# Patient Record
Sex: Female | Born: 1935
Health system: Southern US, Community
[De-identification: ages and names within clinical notes are randomized; demographics above are authoritative.]

## PROBLEM LIST (undated history)

## (undated) DIAGNOSIS — F0390 Unspecified dementia without behavioral disturbance: Secondary | ICD-10-CM

## (undated) DIAGNOSIS — E119 Type 2 diabetes mellitus without complications: Secondary | ICD-10-CM

## (undated) DIAGNOSIS — I10 Essential (primary) hypertension: Secondary | ICD-10-CM

## (undated) HISTORY — DX: Type 2 diabetes mellitus without complications: E11.9

---

## 1998-03-13 ENCOUNTER — Other Ambulatory Visit: Admission: RE | Admit: 1998-03-13 | Discharge: 1998-03-13 | Payer: Self-pay | Admitting: Geriatric Medicine

## 1999-05-21 ENCOUNTER — Encounter: Admission: RE | Admit: 1999-05-21 | Discharge: 1999-05-21 | Payer: Self-pay | Admitting: Geriatric Medicine

## 1999-05-21 ENCOUNTER — Encounter: Payer: Self-pay | Admitting: Geriatric Medicine

## 1999-10-24 ENCOUNTER — Encounter: Payer: Self-pay | Admitting: Geriatric Medicine

## 1999-10-24 ENCOUNTER — Encounter: Admission: RE | Admit: 1999-10-24 | Discharge: 1999-10-24 | Payer: Self-pay | Admitting: Geriatric Medicine

## 1999-11-20 ENCOUNTER — Other Ambulatory Visit: Admission: RE | Admit: 1999-11-20 | Discharge: 1999-11-20 | Payer: Self-pay | Admitting: Orthopedic Surgery

## 2000-07-23 ENCOUNTER — Encounter: Admission: RE | Admit: 2000-07-23 | Discharge: 2000-07-23 | Payer: Self-pay | Admitting: Geriatric Medicine

## 2000-07-23 ENCOUNTER — Encounter: Payer: Self-pay | Admitting: Geriatric Medicine

## 2000-12-29 ENCOUNTER — Encounter (INDEPENDENT_AMBULATORY_CARE_PROVIDER_SITE_OTHER): Payer: Self-pay | Admitting: *Deleted

## 2000-12-29 ENCOUNTER — Ambulatory Visit (HOSPITAL_COMMUNITY): Admission: RE | Admit: 2000-12-29 | Discharge: 2000-12-29 | Payer: Self-pay | Admitting: *Deleted

## 2001-07-25 ENCOUNTER — Encounter: Payer: Self-pay | Admitting: Geriatric Medicine

## 2001-07-25 ENCOUNTER — Encounter: Admission: RE | Admit: 2001-07-25 | Discharge: 2001-07-25 | Payer: Self-pay | Admitting: Geriatric Medicine

## 2002-07-19 ENCOUNTER — Ambulatory Visit (HOSPITAL_COMMUNITY): Admission: RE | Admit: 2002-07-19 | Discharge: 2002-07-19 | Payer: Self-pay | Admitting: Geriatric Medicine

## 2002-07-28 ENCOUNTER — Encounter: Payer: Self-pay | Admitting: Geriatric Medicine

## 2002-07-28 ENCOUNTER — Encounter: Admission: RE | Admit: 2002-07-28 | Discharge: 2002-07-28 | Payer: Self-pay | Admitting: Geriatric Medicine

## 2002-09-18 ENCOUNTER — Encounter: Payer: Self-pay | Admitting: Orthopedic Surgery

## 2002-09-22 ENCOUNTER — Encounter: Payer: Self-pay | Admitting: Orthopedic Surgery

## 2002-09-22 ENCOUNTER — Inpatient Hospital Stay (HOSPITAL_COMMUNITY): Admission: RE | Admit: 2002-09-22 | Discharge: 2002-09-27 | Payer: Self-pay | Admitting: Orthopedic Surgery

## 2003-08-14 ENCOUNTER — Encounter: Admission: RE | Admit: 2003-08-14 | Discharge: 2003-08-14 | Payer: Self-pay | Admitting: Geriatric Medicine

## 2004-05-14 ENCOUNTER — Ambulatory Visit (HOSPITAL_COMMUNITY): Admission: RE | Admit: 2004-05-14 | Discharge: 2004-05-14 | Payer: Self-pay | Admitting: *Deleted

## 2004-05-14 ENCOUNTER — Encounter (INDEPENDENT_AMBULATORY_CARE_PROVIDER_SITE_OTHER): Payer: Self-pay | Admitting: *Deleted

## 2004-08-14 ENCOUNTER — Encounter: Admission: RE | Admit: 2004-08-14 | Discharge: 2004-08-14 | Payer: Self-pay | Admitting: Geriatric Medicine

## 2005-02-23 ENCOUNTER — Encounter: Admission: RE | Admit: 2005-02-23 | Discharge: 2005-02-23 | Payer: Self-pay | Admitting: Geriatric Medicine

## 2005-08-17 ENCOUNTER — Encounter: Admission: RE | Admit: 2005-08-17 | Discharge: 2005-08-17 | Payer: Self-pay | Admitting: Geriatric Medicine

## 2006-09-24 ENCOUNTER — Encounter: Admission: RE | Admit: 2006-09-24 | Discharge: 2006-09-24 | Payer: Self-pay | Admitting: Geriatric Medicine

## 2007-06-13 ENCOUNTER — Ambulatory Visit: Payer: Self-pay | Admitting: Vascular Surgery

## 2007-11-02 ENCOUNTER — Encounter: Admission: RE | Admit: 2007-11-02 | Discharge: 2007-11-02 | Payer: Self-pay | Admitting: Geriatric Medicine

## 2008-11-02 ENCOUNTER — Encounter: Admission: RE | Admit: 2008-11-02 | Discharge: 2008-11-02 | Payer: Self-pay | Admitting: Geriatric Medicine

## 2010-09-19 NOTE — Op Note (Signed)
NAME:  Natalie Peterson, Natalie Peterson                     ACCOUNT NO.:  [id number]   MEDICAL RECORD NO.:  [id number]                   PATIENT TYPE:  INP   LOCATION:  NA                                   FACILITY:  [facility]   PHYSICIAN:  Almedia Balls. Ranell Patrick, M.D.              DATE OF BIRTH:  01-Aug-1926   DATE OF PROCEDURE:  09/22/2002  DATE OF DISCHARGE:                                 OPERATIVE REPORT   PREOPERATIVE DIAGNOSIS:  Right knee osteoarthritis.   POSTOPERATIVE DIAGNOSIS:  Right knee osteoarthritis.   PROCEDURE:  Right total knee replacement utilizing Depuy Sigma RP  prosthesis.   SURGEON:  Almedia Balls. Ranell Patrick, M.D.   FIRST ASSISTANT:  Marlowe Kays, M.D.   ANESTHESIA:  General.   ESTIMATED BLOOD LOSS:  Minimal.   TOURNIQUET TIME:  1 hour and 40 minutes.   INSTRUMENT COUNT:  Correct.   COMPLICATIONS:  None.   FLUIDS REPLACED:  1200 mL crystalloid.   INDICATIONS FOR PROCEDURE:  The patient is a 77 year old female who presents  with a long history of increasing right knee pain. The patient has advanced  osteoarthritis by x-ray, has failed conservative management consisting of  injections, 1antiinflammatories, and pain medications. She now presents for  a knee replacement surgery in order to relieve her pain and improve her  mobility. The risk of DVTs and infection were discussed. The patient  presents now for surgical treatment.   DESCRIPTION OF PROCEDURE:  After an adequate level of anesthesia was  achieved, the patient was positioned supine on the operating table, a  nonsterile tourniquet was placed on the right proximal thigh, the right  proximal leg was prepped and draped in its entirety in a sterile fashion.  Starting range of motion revealed a 7 degree flexion contracture with  flexion past 100 degrees with stable varus and valgus stress. After sterile  prep and drape, the right leg was cannulated, tourniquet elevated to 350  mmHg and a longitudinal skin incision was  created in the midline of the  knee. This was taken sharply down to the medial parapatellar retinaculum and  medial parapatellar arthrotomy was then created. The patella was everted  allowing the knee to flex and the distal femur exposed. At this point, a  step cut drill was used to open the distal end of the femur, facilitating  placement of an intermedullary guide for distal femoral resection. 10 mm of  the distal femur were removed utilizing the oscillating saw facilitating an  AP sizing guide and this was placed with the feet on the posterior aspect of  the femoral condyles and anterior down approach. She was basically a size 3  by sizing guide slightly smaller than that and she was set for a size 3 and  pinned for the right knee in 3 degrees of external rotation. The AP  resection block was then inserted and used to remove bone from the posterior  aspects of  the femur. Soft tissue was removed from the tibia facilitating  subluxation of the tibia forward. This allowed [place] to place the external  alignment guide for the perpendicular 90 degree tibial cut. 4 mm was taken  off the defective side. Once the resection from the proximal tibia was done,  flexion extension gaps were checked, a 10 mm block was placed with the knee  in extension. This was noted to be quite tight and at this point 2 mm of  additional resection was performed from the tibia and the 10 mm block was  then able to be inserted without difficulty. At this point, posterior  osteophytes were taken off the back of the femur utilizing a curved  osteotome and laminar spreader. The posterior capsule was released utilizing  Cobb elevators sharply to the back of the femur. The remaining meniscus was  removed, the chamfer block and posterior cruciate substituting box cut block  was placed onto the end of the distal femur and pinned in place thus the box  cut was made and the chamfer cuts were also made. At this point, trial   components were introduced, these were all size three components and  __________ keel on the tibial side as well. The knee was taken through a  full range of motion and outstanding soft tissue balance was noted both in  extension and in flexion, full range of motion in full extension was  achieved. At this point, the patella was then assessed for its thickness,  this was 23 mm starting thickness. This was taken down to 14 mm thickness  with an 8 poly recreating the 23 mm patella thickness. Once the patella  sizing was done which was a size 32 mm, the drill holes were made in the  patella for cementing with the button in place. Trials removed, the knee was  thoroughly irrigated with pulsatile irrigation. The remaining soft tissue  was removed to facilitate good exposure, knee was subluxed forward and  utilizing Depuy 1 cement, the real component size 3 Depuy Sigma rotating  platform components were cemented into place. The knee was placed in full  extension with a trial 10 insert and axial load applied to the end of the  femur during the cement hardening phase. Also the patella was cemented into  place and a clamp used to hold that until the cement was hard on the back  table. The knee was inspected for loose cement, osteotome was used to remove  excess cement from around the edge of the femur. Taken through a full range  of motion and again excellent extension, flexion and stability was noted,  full extension was achieved. The patella tracked normally without any  patellar tilt utilizing a no-touch __________  technique, the trial insert  was removed and the 10 mm thick posterior stabilized poly was inserted in  place after thorough inspection for loose debris. At this point, the knee  was re-reduced, drain was placed in the lateral gutter. The medial  parapatellar retinaculum was closed utilizing #1 Vicryl in a figure-of-eight interrupted fashion followed by a 2-0 subcu with staples for the  skin.  Sterile dressings applied followed by a knee immobilizer. The patient was  taken to the recovery room in stable condition having tolerated the surgery  well.  Almedia Balls. Ranell Patrick, M.D.    SRN/MEDQ  D:  09/22/2002  T:  09/22/2002  Job:  [id number]

## 2010-09-19 NOTE — Discharge Summary (Signed)
NAME:  Natalie Peterson, Natalie Peterson                     ACCOUNT NO.:  [id number]   MEDICAL RECORD NO.:  [id number]                   PATIENT TYPE:  INP   LOCATION:  0460                                 FACILITY:  [facility]   PHYSICIAN:  Almedia Balls. Ranell Patrick, M.D.              DATE OF BIRTH:  June 15, 1926   DATE OF ADMISSION:  09/22/2002  DATE OF DISCHARGE:  09/27/2002                                 DISCHARGE SUMMARY   ADMITTING DIAGNOSES:  1. Osteoarthritis right knee.  2. Hypertension.  3. Hypercholesterolemia.  4. Osteopenia.   DISCHARGE DIAGNOSES:  1. Osteoarthritis right knee.  2. Hypertension.  3. Hypercholesterolemia.  4. Osteopenia.  5. Mild postoperative anemia.  6. Dysphagia regarding p.o. pain medications.   OPERATION:  On Sep 22, 2002 the patient underwent right total knee  replacement arthroplasty utilizing DePuy Sigma RP prosthesis - all three  components cemented; Dr. Fayrene Fearing [place] assisted.   CONSULTS:  None.   BRIEF HISTORY:  This 77 year old lady with progressive and interfering right  knee pain had been seen in the office for continuing progressive problems  concerning inability to get about as well as increased pain.  Conservative  treatment including corticosteroid injections as well as antiinflammatories  have only offered her short-term relief.  She does continue with her overall  pain and discomfort.  After much consideration and x-rays showing  deterioration of the joint space it was felt she would benefit from surgical  intervention and is admitted for the above procedure.   COURSE IN THE HOSPITAL:  The patient tolerated the surgical procedure quite  well.  She was placed on the total knee protocol and progressed nicely with  that.  It was thought she might need an inpatient rehab program.  A rehab  consult was asked for and received.  However, the patient was progressing so  nicely with therapy it was felt that she was doing too well to be a  candidate for an  inpatient rehab program.  The patient only suffered a mild  anemia postoperatively.  She was placed on Coumadin protocol postoperatively  for prevention of DVT and encouraged in ambulation.  She had a little  difficulty with her diet early on as well as a little difficulty in  swallowing pills.  The oropharynx was examined and found to have no  excoriation or plaques.  Cepacol Lozenges were offered.  She was somewhat  improved at discharge.   The left lower extremity had flexed on CPM machine to about 65 degrees.  She  was weightbearing as tolerated, ambulating in the hall quite well with the  assistance of physical therapy.  The wound was dry, neurovascular intact to  the left lower extremity.  It was felt that she could be transferred to the  [facility] to continue with rehabilitation as listed  below.   LABORATORY VALUES IN THE HOSPITAL:  Hematologically, CBC with differential  which was  completely within normal limits preoperatively; her hemoglobin  dropped to 9.5 with hematocrit 27.9 on Sep 25, 2002.  Blood chemistries  stayed normal.  Urinalysis negative for urinary tract infection.  Chest x-  ray showed tortuous aorta, no active disease.  Electrocardiogram showed  normal sinus rhythm; also noted on the electrocardiogram was a left bundle-  branch block.   CONDITION ON DISCHARGE:  Improved, stable.   PLAN:  1. The patient is transferred/discharged to the [facility] to continue with her total knee rehabilitation.  2. She may continue weightbearing as tolerated, range of motion as tolerated     in the left knee with emphasis on extension as well as flexion.  3. Knee immobilizer on while up and about.  She does not have to be on while     she is in bed.  4. She is to have a dry dressing p.r.n.; she may shower.  5. She had been using a rolling walker in the hospital and this can be     continued.   As far as her swallowing situation,  recommend Dr. Pete Glatter, et. al.  continue to follow with that.   DISCHARGE MEDICATIONS:  1. Zocor 20 mg p.o. daily.  2. Miacalcin nasal spray daily.  3. Prinivil 5 mg tablets one daily.  4. Nasalide 0.025% spray b.i.d.  5. Colace 100 mg b.i.d.  6. Trinsicon one capsule t.i.d.  7. Coumadin per protocol - she was taking 5 mg daily at the time of     discharge.  8. Percocet was used for her discomfort.  9. Robaxin was used as a muscle relaxant.  10.      Continue with other medications as per Dr. Pete Glatter.     Dooley L. Cherlynn June.                 Almedia Balls. Ranell Patrick, M.D.    DLU/MEDQ  D:  09/27/2002  T:  09/27/2002  Job:  [id number]

## 2010-09-19 NOTE — H&P (Signed)
NAME:  Natalie Peterson, Natalie Peterson                     ACCOUNT NO.:  [id number]   MEDICAL RECORD NO.:  [id number]                   PATIENT TYPE:  INP   LOCATION:  NA                                   FACILITY:  [facility]   PHYSICIAN:  Almedia Balls. Ranell Patrick, M.D.              DATE OF BIRTH:  March 18, 1927   DATE OF ADMISSION:  09/22/2002  DATE OF DISCHARGE:                                HISTORY & PHYSICAL   CHIEF COMPLAINT:  Right knee pain.   HISTORY OF PRESENT ILLNESS:  The patient is a 77 year old female with a long  history of right knee pain.  The patient states that she has been having  increasing pain in her right knee.  She is also complaining of swelling.  She states that she has pain at rest and pain with activity.  She has  previously tried corticosteroid injections which have given her temporary  relief but no long-term solution.  Dr. Ranell Patrick feels that it is best at this  point to proceed with total knee replacement and the patient agrees.  The  risks and benefits of the surgery have been discussed with the patient and  the patient wishes to proceed.   PAST MEDICAL HISTORY:  1. Hypertension.  2. Osteoarthritis.  3. Hypercholesterolemia.  4. Osteopenia.   PAST SURGICAL HISTORY:  1. Right cataract.  2. Right knee arthroscopy.  3. Left rotator cuff repair.  4. Total abdominal hysterectomy.  5. Tonsils and adenoids.   MEDICATIONS:  1. Lipitor 10 mg one p.o. daily.  2. Miacalcin one spray per day.  3. Nasarel 0.025% two sprays b.i.d.  4. Lisinopril 5 mg one p.o. daily.  5. Chlorpheniramine 4 mg one p.o. p.r.n.   ALLERGIES:  1. DAYPRO causes a rash.  2. VIOXX causes a rash.   SOCIAL HISTORY:  The patient denies any tobacco or alcohol use.  She is a  widow.  She is a resident of [facility] and will be returning there after  surgery.   FAMILY HISTORY:  Father - hypertension and stroke.  Mother - also  hypertension and stroke.   REVIEW OF SYSTEMS:  GENERAL:  Denies fevers,  chills, night sweats, bleeding  tendencies.  CNS:  Denies blurry or double vision, seizures, headaches,  paralysis.  RESPIRATORY:  Denies shortness of breath, productive cough,  hemoptysis.  CV:  Denies chest pain, angina, orthopnea.  GI:  Positive  constipation.  Denies nausea, vomiting, diarrhea, melena, or bloody stools.  GU:  No dysuria, hematuria, or discharge.  MUSCULOSKELETAL:  As pertinent to  the HPI.   PHYSICAL EXAMINATION:  VITAL SIGNS:  Blood pressure 160/80, pulse 60,  respirations 12.  GENERAL:  Well-developed, well-nourished 77 year old female.  HEENT:  Normocephalic, atraumatic.  Pupils equal, round, and reactive to  light.  NECK:  Supple, no carotid bruit noted.  CHEST:  Clear to auscultation bilaterally.  No wheezes or crackles.  HEART:  Regular rate and  rhythm.  No rubs or gallops.  A 1/6 systolic  murmur.  ABDOMEN:  Soft, nontender, nondistended, with positive bowel sounds x4.  EXTREMITIES:  Positive edema.  Tender to palpation in bilateral joint line.  Range of motion is 0 to 110 degrees.  She is neurovascularly intact  distally.  SKIN:  No rashes or lesions.   LABORATORY DATA:  X-ray reveals tricompartmental osteoarthritis right knee.   IMPRESSION:  1. Osteoarthritis, right knee.  2. Hypertension.  3. Hypercholesterolemia.  4. Osteopenia.   PLAN:  The patient will be admitted to [facility] on Sep 22, 2002  and undergo a right total knee arthroplasty by Dr. Malon Kindle.     Clarene Reamer, P.A.-C.                   Almedia Balls. Ranell Patrick, M.D.    SW/MEDQ  D:  09/19/2002  T:  09/19/2002  Job:  [id number]

## 2010-10-13 ENCOUNTER — Ambulatory Visit (HOSPITAL_COMMUNITY)
Admission: RE | Admit: 2010-10-13 | Discharge: 2010-10-13 | Disposition: A | Discharge status: Home / Self Care | Payer: Medicare Other | Source: Ambulatory Visit | Attending: Internal Medicine | Admitting: Internal Medicine

## 2010-10-13 ENCOUNTER — Other Ambulatory Visit: Payer: Self-pay | Admitting: Internal Medicine

## 2010-10-13 DIAGNOSIS — M7989 Other specified soft tissue disorders: Secondary | ICD-10-CM | POA: Insufficient documentation

## 2010-10-13 DIAGNOSIS — M79609 Pain in unspecified limb: Secondary | ICD-10-CM | POA: Insufficient documentation

## 2010-10-13 DIAGNOSIS — R52 Pain, unspecified: Secondary | ICD-10-CM

## 2011-11-12 ENCOUNTER — Other Ambulatory Visit: Payer: Self-pay | Admitting: Geriatric Medicine

## 2011-11-12 DIAGNOSIS — Z1231 Encounter for screening mammogram for malignant neoplasm of breast: Secondary | ICD-10-CM

## 2011-11-27 ENCOUNTER — Ambulatory Visit
Admission: RE | Admit: 2011-11-27 | Discharge: 2011-11-27 | Disposition: A | Discharge status: Home / Self Care | Payer: Medicare Other | Source: Ambulatory Visit | Attending: Geriatric Medicine | Admitting: Geriatric Medicine

## 2011-11-27 DIAGNOSIS — Z1231 Encounter for screening mammogram for malignant neoplasm of breast: Secondary | ICD-10-CM

## 2012-11-23 ENCOUNTER — Other Ambulatory Visit: Payer: Self-pay | Admitting: Geriatric Medicine

## 2012-11-23 DIAGNOSIS — Z1231 Encounter for screening mammogram for malignant neoplasm of breast: Secondary | ICD-10-CM

## 2012-12-13 ENCOUNTER — Ambulatory Visit
Admission: RE | Admit: 2012-12-13 | Discharge: 2012-12-13 | Disposition: A | Discharge status: Home / Self Care | Payer: Medicare Other | Source: Ambulatory Visit | Attending: Geriatric Medicine | Admitting: Geriatric Medicine

## 2012-12-13 DIAGNOSIS — Z1231 Encounter for screening mammogram for malignant neoplasm of breast: Secondary | ICD-10-CM

## 2013-11-28 ENCOUNTER — Other Ambulatory Visit: Payer: Self-pay | Admitting: Geriatric Medicine

## 2013-11-28 DIAGNOSIS — Z1231 Encounter for screening mammogram for malignant neoplasm of breast: Secondary | ICD-10-CM

## 2013-12-13 ENCOUNTER — Other Ambulatory Visit (HOSPITAL_COMMUNITY): Payer: Self-pay | Admitting: Geriatric Medicine

## 2013-12-13 DIAGNOSIS — I359 Nonrheumatic aortic valve disorder, unspecified: Secondary | ICD-10-CM

## 2013-12-15 ENCOUNTER — Ambulatory Visit (HOSPITAL_COMMUNITY): Payer: Medicare Other | Attending: Cardiovascular Disease | Admitting: Radiology

## 2013-12-15 DIAGNOSIS — I359 Nonrheumatic aortic valve disorder, unspecified: Secondary | ICD-10-CM | POA: Insufficient documentation

## 2013-12-15 NOTE — Progress Notes (Signed)
Echocardiogram performed.  

## 2013-12-18 ENCOUNTER — Ambulatory Visit
Admission: RE | Admit: 2013-12-18 | Discharge: 2013-12-18 | Disposition: A | Discharge status: Home / Self Care | Payer: Medicare Other | Source: Ambulatory Visit | Attending: Geriatric Medicine | Admitting: Geriatric Medicine

## 2013-12-18 DIAGNOSIS — Z1231 Encounter for screening mammogram for malignant neoplasm of breast: Secondary | ICD-10-CM

## 2014-11-12 ENCOUNTER — Other Ambulatory Visit: Payer: Self-pay

## 2014-11-12 DIAGNOSIS — Z803 Family history of malignant neoplasm of breast: Secondary | ICD-10-CM

## 2014-11-12 DIAGNOSIS — Z1231 Encounter for screening mammogram for malignant neoplasm of breast: Secondary | ICD-10-CM

## 2014-12-24 ENCOUNTER — Ambulatory Visit
Admission: RE | Admit: 2014-12-24 | Discharge: 2014-12-24 | Disposition: A | Discharge status: Home / Self Care | Payer: Medicare Other | Source: Ambulatory Visit

## 2014-12-24 DIAGNOSIS — Z1231 Encounter for screening mammogram for malignant neoplasm of breast: Secondary | ICD-10-CM

## 2014-12-24 DIAGNOSIS — Z803 Family history of malignant neoplasm of breast: Secondary | ICD-10-CM

## 2014-12-25 ENCOUNTER — Other Ambulatory Visit: Payer: Self-pay | Admitting: Geriatric Medicine

## 2014-12-25 DIAGNOSIS — R928 Other abnormal and inconclusive findings on diagnostic imaging of breast: Secondary | ICD-10-CM

## 2014-12-31 ENCOUNTER — Ambulatory Visit
Admission: RE | Admit: 2014-12-31 | Discharge: 2014-12-31 | Disposition: A | Discharge status: Home / Self Care | Payer: Medicare Other | Source: Ambulatory Visit | Attending: Geriatric Medicine | Admitting: Geriatric Medicine

## 2014-12-31 DIAGNOSIS — R928 Other abnormal and inconclusive findings on diagnostic imaging of breast: Secondary | ICD-10-CM

## 2015-06-11 DIAGNOSIS — H353221 Exudative age-related macular degeneration, left eye, with active choroidal neovascularization: Secondary | ICD-10-CM | POA: Diagnosis not present

## 2015-06-11 DIAGNOSIS — H3562 Retinal hemorrhage, left eye: Secondary | ICD-10-CM | POA: Diagnosis not present

## 2015-07-04 DIAGNOSIS — Z79899 Other long term (current) drug therapy: Secondary | ICD-10-CM | POA: Diagnosis not present

## 2015-07-04 DIAGNOSIS — R609 Edema, unspecified: Secondary | ICD-10-CM | POA: Diagnosis not present

## 2015-07-04 DIAGNOSIS — I129 Hypertensive chronic kidney disease with stage 1 through stage 4 chronic kidney disease, or unspecified chronic kidney disease: Secondary | ICD-10-CM | POA: Diagnosis not present

## 2015-07-04 DIAGNOSIS — N183 Chronic kidney disease, stage 3 (moderate): Secondary | ICD-10-CM | POA: Diagnosis not present

## 2015-07-04 DIAGNOSIS — E669 Obesity, unspecified: Secondary | ICD-10-CM | POA: Diagnosis not present

## 2015-07-04 DIAGNOSIS — G4730 Sleep apnea, unspecified: Secondary | ICD-10-CM | POA: Diagnosis not present

## 2015-07-04 DIAGNOSIS — Z6830 Body mass index (BMI) 30.0-30.9, adult: Secondary | ICD-10-CM | POA: Diagnosis not present

## 2015-07-04 DIAGNOSIS — R600 Localized edema: Secondary | ICD-10-CM | POA: Diagnosis not present

## 2015-07-09 DIAGNOSIS — H353221 Exudative age-related macular degeneration, left eye, with active choroidal neovascularization: Secondary | ICD-10-CM | POA: Diagnosis not present

## 2015-08-13 DIAGNOSIS — H353221 Exudative age-related macular degeneration, left eye, with active choroidal neovascularization: Secondary | ICD-10-CM | POA: Diagnosis not present

## 2015-08-15 DIAGNOSIS — R609 Edema, unspecified: Secondary | ICD-10-CM | POA: Diagnosis not present

## 2015-08-15 DIAGNOSIS — I129 Hypertensive chronic kidney disease with stage 1 through stage 4 chronic kidney disease, or unspecified chronic kidney disease: Secondary | ICD-10-CM | POA: Diagnosis not present

## 2015-08-15 DIAGNOSIS — N183 Chronic kidney disease, stage 3 (moderate): Secondary | ICD-10-CM | POA: Diagnosis not present

## 2015-09-05 DIAGNOSIS — Z79899 Other long term (current) drug therapy: Secondary | ICD-10-CM | POA: Diagnosis not present

## 2015-09-05 DIAGNOSIS — I872 Venous insufficiency (chronic) (peripheral): Secondary | ICD-10-CM | POA: Diagnosis not present

## 2015-09-05 DIAGNOSIS — N183 Chronic kidney disease, stage 3 (moderate): Secondary | ICD-10-CM | POA: Diagnosis not present

## 2015-09-05 DIAGNOSIS — I129 Hypertensive chronic kidney disease with stage 1 through stage 4 chronic kidney disease, or unspecified chronic kidney disease: Secondary | ICD-10-CM | POA: Diagnosis not present

## 2015-09-24 DIAGNOSIS — H353221 Exudative age-related macular degeneration, left eye, with active choroidal neovascularization: Secondary | ICD-10-CM | POA: Diagnosis not present

## 2015-11-04 DIAGNOSIS — H353221 Exudative age-related macular degeneration, left eye, with active choroidal neovascularization: Secondary | ICD-10-CM | POA: Diagnosis not present

## 2015-11-25 ENCOUNTER — Other Ambulatory Visit: Payer: Self-pay | Admitting: Geriatric Medicine

## 2015-11-25 DIAGNOSIS — Z1231 Encounter for screening mammogram for malignant neoplasm of breast: Secondary | ICD-10-CM

## 2015-12-16 DIAGNOSIS — H353221 Exudative age-related macular degeneration, left eye, with active choroidal neovascularization: Secondary | ICD-10-CM | POA: Diagnosis not present

## 2015-12-25 ENCOUNTER — Ambulatory Visit: Payer: Medicare Other

## 2015-12-25 ENCOUNTER — Ambulatory Visit
Admission: RE | Admit: 2015-12-25 | Discharge: 2015-12-25 | Disposition: A | Discharge status: Home / Self Care | Payer: Medicare Other | Source: Ambulatory Visit | Attending: Geriatric Medicine | Admitting: Geriatric Medicine

## 2015-12-25 DIAGNOSIS — Z1231 Encounter for screening mammogram for malignant neoplasm of breast: Secondary | ICD-10-CM | POA: Diagnosis not present

## 2016-01-23 DIAGNOSIS — Z1389 Encounter for screening for other disorder: Secondary | ICD-10-CM | POA: Diagnosis not present

## 2016-01-23 DIAGNOSIS — E7800 Pure hypercholesterolemia, unspecified: Secondary | ICD-10-CM | POA: Diagnosis not present

## 2016-01-23 DIAGNOSIS — Z23 Encounter for immunization: Secondary | ICD-10-CM | POA: Diagnosis not present

## 2016-01-23 DIAGNOSIS — G4730 Sleep apnea, unspecified: Secondary | ICD-10-CM | POA: Diagnosis not present

## 2016-01-23 DIAGNOSIS — I129 Hypertensive chronic kidney disease with stage 1 through stage 4 chronic kidney disease, or unspecified chronic kidney disease: Secondary | ICD-10-CM | POA: Diagnosis not present

## 2016-01-23 DIAGNOSIS — B372 Candidiasis of skin and nail: Secondary | ICD-10-CM | POA: Diagnosis not present

## 2016-01-23 DIAGNOSIS — Z0000 Encounter for general adult medical examination without abnormal findings: Secondary | ICD-10-CM | POA: Diagnosis not present

## 2016-01-23 DIAGNOSIS — N183 Chronic kidney disease, stage 3 (moderate): Secondary | ICD-10-CM | POA: Diagnosis not present

## 2016-01-23 DIAGNOSIS — Z79899 Other long term (current) drug therapy: Secondary | ICD-10-CM | POA: Diagnosis not present

## 2016-01-23 DIAGNOSIS — M85862 Other specified disorders of bone density and structure, left lower leg: Secondary | ICD-10-CM | POA: Diagnosis not present

## 2016-01-28 DIAGNOSIS — H353111 Nonexudative age-related macular degeneration, right eye, early dry stage: Secondary | ICD-10-CM | POA: Diagnosis not present

## 2016-01-28 DIAGNOSIS — H353221 Exudative age-related macular degeneration, left eye, with active choroidal neovascularization: Secondary | ICD-10-CM | POA: Diagnosis not present

## 2016-03-05 DIAGNOSIS — M85862 Other specified disorders of bone density and structure, left lower leg: Secondary | ICD-10-CM | POA: Diagnosis not present

## 2016-03-05 DIAGNOSIS — M8588 Other specified disorders of bone density and structure, other site: Secondary | ICD-10-CM | POA: Diagnosis not present

## 2016-03-12 DIAGNOSIS — H353221 Exudative age-related macular degeneration, left eye, with active choroidal neovascularization: Secondary | ICD-10-CM | POA: Diagnosis not present

## 2016-05-27 DIAGNOSIS — H353221 Exudative age-related macular degeneration, left eye, with active choroidal neovascularization: Secondary | ICD-10-CM | POA: Diagnosis not present

## 2016-07-22 DIAGNOSIS — H353221 Exudative age-related macular degeneration, left eye, with active choroidal neovascularization: Secondary | ICD-10-CM | POA: Diagnosis not present

## 2016-07-30 DIAGNOSIS — R7301 Impaired fasting glucose: Secondary | ICD-10-CM | POA: Diagnosis not present

## 2016-07-30 DIAGNOSIS — N183 Chronic kidney disease, stage 3 (moderate): Secondary | ICD-10-CM | POA: Diagnosis not present

## 2016-07-30 DIAGNOSIS — I129 Hypertensive chronic kidney disease with stage 1 through stage 4 chronic kidney disease, or unspecified chronic kidney disease: Secondary | ICD-10-CM | POA: Diagnosis not present

## 2016-07-30 DIAGNOSIS — R634 Abnormal weight loss: Secondary | ICD-10-CM | POA: Diagnosis not present

## 2016-07-30 DIAGNOSIS — Z79899 Other long term (current) drug therapy: Secondary | ICD-10-CM | POA: Diagnosis not present

## 2016-08-27 DIAGNOSIS — I129 Hypertensive chronic kidney disease with stage 1 through stage 4 chronic kidney disease, or unspecified chronic kidney disease: Secondary | ICD-10-CM | POA: Diagnosis not present

## 2016-08-27 DIAGNOSIS — E1121 Type 2 diabetes mellitus with diabetic nephropathy: Secondary | ICD-10-CM | POA: Diagnosis not present

## 2016-08-27 DIAGNOSIS — N183 Chronic kidney disease, stage 3 (moderate): Secondary | ICD-10-CM | POA: Diagnosis not present

## 2016-08-27 DIAGNOSIS — I872 Venous insufficiency (chronic) (peripheral): Secondary | ICD-10-CM | POA: Diagnosis not present

## 2016-09-08 DIAGNOSIS — H353221 Exudative age-related macular degeneration, left eye, with active choroidal neovascularization: Secondary | ICD-10-CM | POA: Diagnosis not present

## 2016-10-20 DIAGNOSIS — H353221 Exudative age-related macular degeneration, left eye, with active choroidal neovascularization: Secondary | ICD-10-CM | POA: Diagnosis not present

## 2016-10-22 DIAGNOSIS — R609 Edema, unspecified: Secondary | ICD-10-CM | POA: Diagnosis not present

## 2016-10-22 DIAGNOSIS — E1121 Type 2 diabetes mellitus with diabetic nephropathy: Secondary | ICD-10-CM | POA: Diagnosis not present

## 2016-10-22 DIAGNOSIS — E1165 Type 2 diabetes mellitus with hyperglycemia: Secondary | ICD-10-CM | POA: Diagnosis not present

## 2016-10-22 DIAGNOSIS — Z79899 Other long term (current) drug therapy: Secondary | ICD-10-CM | POA: Diagnosis not present

## 2016-11-17 DIAGNOSIS — H353221 Exudative age-related macular degeneration, left eye, with active choroidal neovascularization: Secondary | ICD-10-CM | POA: Diagnosis not present

## 2016-11-25 ENCOUNTER — Encounter: Payer: Self-pay | Admitting: Podiatry

## 2016-11-25 ENCOUNTER — Ambulatory Visit (INDEPENDENT_AMBULATORY_CARE_PROVIDER_SITE_OTHER): Payer: Medicare Other | Admitting: Podiatry

## 2016-11-25 VITALS — BP 158/68 | HR 64

## 2016-11-25 DIAGNOSIS — B351 Tinea unguium: Secondary | ICD-10-CM | POA: Diagnosis not present

## 2016-11-25 DIAGNOSIS — M79676 Pain in unspecified toe(s): Secondary | ICD-10-CM

## 2016-11-25 DIAGNOSIS — E1142 Type 2 diabetes mellitus with diabetic polyneuropathy: Secondary | ICD-10-CM | POA: Diagnosis not present

## 2016-11-25 NOTE — Progress Notes (Signed)
   Subjective:    Patient ID: Natalie Peterson, female    DOB: 07-04-1926, 83 y.o.   MRN: [id number]  HPI this patient presents the office with chief complaint of painful long thick nails. Patient states her nails are painful walking and wearing her shoes. She was referred to this office by her medical doctor.  She says she has been unable to self treat her nails.  This patient is diabetic on metformin    Review of Systems  All other systems reviewed and are negative.      Objective:   Physical Exam GENERAL APPEARANCE: Alert, conversant. Appropriately groomed. No acute distress.  VASCULAR: Pedal pulses are  Not  palpable at  [facility] bilateral due to swelling both feet.  Capillary refill time is immediate to all digits,  Normal temperature gradient.   NEUROLOGIC: sensation is diminished to 5.07 monofilament at 5/5 sites bilateral.  Light touch is diminished  bilateral, Muscle strength normal.  MUSCULOSKELETAL: acceptable muscle strength, tone and stability bilateral.  Intrinsic muscluature intact bilateral.  Rectus appearance of foot and digits noted bilateral. Swelling  3+  B/L NAILS Thick disfigured discolored hallux toenails bilaterally and second toenail right foot.  No evidence of bacterial infection or drainage  DERMATOLOGIC: skin color, texture, and turgor are within normal limits.  No preulcerative lesions or ulcers  are seen, no interdigital maceration noted.  No open lesions present.   No drainage noted.         Assessment & Plan:  Onychomycosis  B/L   Diabetes with neuropathy   IE  Debride nails  RTC 3 months   Helane GuntherGregory Zuriel Yeaman DPM

## 2016-12-15 DIAGNOSIS — H353221 Exudative age-related macular degeneration, left eye, with active choroidal neovascularization: Secondary | ICD-10-CM | POA: Diagnosis not present

## 2017-01-26 DIAGNOSIS — H353221 Exudative age-related macular degeneration, left eye, with active choroidal neovascularization: Secondary | ICD-10-CM | POA: Diagnosis not present

## 2017-01-28 DIAGNOSIS — G4730 Sleep apnea, unspecified: Secondary | ICD-10-CM | POA: Diagnosis not present

## 2017-01-28 DIAGNOSIS — I129 Hypertensive chronic kidney disease with stage 1 through stage 4 chronic kidney disease, or unspecified chronic kidney disease: Secondary | ICD-10-CM | POA: Diagnosis not present

## 2017-01-28 DIAGNOSIS — Z0000 Encounter for general adult medical examination without abnormal findings: Secondary | ICD-10-CM | POA: Diagnosis not present

## 2017-01-28 DIAGNOSIS — E669 Obesity, unspecified: Secondary | ICD-10-CM | POA: Diagnosis not present

## 2017-02-25 DIAGNOSIS — H353221 Exudative age-related macular degeneration, left eye, with active choroidal neovascularization: Secondary | ICD-10-CM | POA: Diagnosis not present

## 2017-03-03 ENCOUNTER — Encounter: Payer: Self-pay | Admitting: Podiatry

## 2017-03-03 ENCOUNTER — Ambulatory Visit (INDEPENDENT_AMBULATORY_CARE_PROVIDER_SITE_OTHER): Payer: Medicare Other | Admitting: Podiatry

## 2017-03-03 DIAGNOSIS — B351 Tinea unguium: Secondary | ICD-10-CM | POA: Diagnosis not present

## 2017-03-03 DIAGNOSIS — E1142 Type 2 diabetes mellitus with diabetic polyneuropathy: Secondary | ICD-10-CM | POA: Diagnosis not present

## 2017-03-03 DIAGNOSIS — M79676 Pain in unspecified toe(s): Secondary | ICD-10-CM

## 2017-03-03 NOTE — Progress Notes (Signed)
Complaint:  Visit Type: Patient returns to my office for continued preventative foot care services. Complaint: Patient states" my nails have grown long and thick and become painful to walk and wear shoes" Patient has been diagnosed with DM with no foot complications. The patient presents for preventative foot care services. No changes to ROS  Podiatric Exam: Vascular: dorsalis pedis and posterior tibial pulses are weakly  palpable bilateral. Capillary return is immediate. Temperature gradient is WNL. Skin turgor WNL  Sensorium: Diminished  Semmes Weinstein monofilament test. Normal tactile sensation bilaterally. Nail Exam: Pt has thick disfigured discolored nails with subungual debris noted hallux nails  B/L and second right. Ulcer Exam: There is no evidence of ulcer or pre-ulcerative changes or infection. Orthopedic Exam: Muscle tone and strength are WNL. No limitations in general ROM. No crepitus or effusions noted. Foot type and digits show no abnormalities. Bony prominences are unremarkable. Skin: No Porokeratosis. No infection or ulcers  Diagnosis:  Onychomycosis, , Pain in right toe, pain in left toes  Treatment & Plan Procedures and Treatment: Consent by patient was obtained for treatment procedures.   Debridement of mycotic and hypertrophic toenails, 1 through 5 bilateral and clearing of subungual debris. No ulceration, no infection noted.  Return Visit-Office Procedure: Patient instructed to return to the office for a follow up visit 3 months for continued evaluation and treatment.    Lauralei Clouse DPM 

## 2017-04-01 DIAGNOSIS — H353221 Exudative age-related macular degeneration, left eye, with active choroidal neovascularization: Secondary | ICD-10-CM | POA: Diagnosis not present

## 2017-05-13 DIAGNOSIS — E113293 Type 2 diabetes mellitus with mild nonproliferative diabetic retinopathy without macular edema, bilateral: Secondary | ICD-10-CM | POA: Diagnosis not present

## 2017-05-13 DIAGNOSIS — H353112 Nonexudative age-related macular degeneration, right eye, intermediate dry stage: Secondary | ICD-10-CM | POA: Diagnosis not present

## 2017-05-13 DIAGNOSIS — H353221 Exudative age-related macular degeneration, left eye, with active choroidal neovascularization: Secondary | ICD-10-CM | POA: Diagnosis not present

## 2017-05-13 DIAGNOSIS — H43813 Vitreous degeneration, bilateral: Secondary | ICD-10-CM | POA: Diagnosis not present

## 2017-05-31 DIAGNOSIS — E119 Type 2 diabetes mellitus without complications: Secondary | ICD-10-CM | POA: Diagnosis not present

## 2017-06-02 ENCOUNTER — Encounter: Payer: Self-pay | Admitting: Podiatry

## 2017-06-02 ENCOUNTER — Ambulatory Visit (INDEPENDENT_AMBULATORY_CARE_PROVIDER_SITE_OTHER): Payer: Medicare Other | Admitting: Podiatry

## 2017-06-02 DIAGNOSIS — B351 Tinea unguium: Secondary | ICD-10-CM

## 2017-06-02 DIAGNOSIS — E1142 Type 2 diabetes mellitus with diabetic polyneuropathy: Secondary | ICD-10-CM

## 2017-06-02 DIAGNOSIS — M79676 Pain in unspecified toe(s): Secondary | ICD-10-CM

## 2017-06-02 NOTE — Progress Notes (Signed)
Complaint:  Visit Type: Patient returns to my office for continued preventative foot care services. Complaint: Patient states" my nails have grown long and thick and become painful to walk and wear shoes" Patient has been diagnosed with DM with no foot complications. The patient presents for preventative foot care services. No changes to ROS  Podiatric Exam: Vascular: dorsalis pedis and posterior tibial pulses are weakly  palpable bilateral. Capillary return is immediate. Temperature gradient is WNL. Skin turgor WNL  Sensorium: Diminished  Semmes Weinstein monofilament test. Normal tactile sensation bilaterally. Nail Exam: Pt has thick disfigured discolored nails with subungual debris noted hallux nails  B/L and second right. Ulcer Exam: There is no evidence of ulcer or pre-ulcerative changes or infection. Orthopedic Exam: Muscle tone and strength are WNL. No limitations in general ROM. No crepitus or effusions noted. Foot type and digits show no abnormalities. Bony prominences are unremarkable. Skin: No Porokeratosis. No infection or ulcers  Diagnosis:  Onychomycosis, , Pain in right toe, pain in left toes  Treatment & Plan Procedures and Treatment: Consent by patient was obtained for treatment procedures.   Debridement of mycotic and hypertrophic toenails, 1 through 5 bilateral and clearing of subungual debris. No ulceration, no infection noted.  Return Visit-Office Procedure: Patient instructed to return to the office for a follow up visit 3 months for continued evaluation and treatment.    Sheldon Sem DPM 

## 2017-06-24 DIAGNOSIS — H353221 Exudative age-related macular degeneration, left eye, with active choroidal neovascularization: Secondary | ICD-10-CM | POA: Diagnosis not present

## 2017-07-20 DIAGNOSIS — L400 Psoriasis vulgaris: Secondary | ICD-10-CM | POA: Diagnosis not present

## 2017-07-29 DIAGNOSIS — H353221 Exudative age-related macular degeneration, left eye, with active choroidal neovascularization: Secondary | ICD-10-CM | POA: Diagnosis not present

## 2017-08-26 DIAGNOSIS — Z79899 Other long term (current) drug therapy: Secondary | ICD-10-CM | POA: Diagnosis not present

## 2017-08-26 DIAGNOSIS — I129 Hypertensive chronic kidney disease with stage 1 through stage 4 chronic kidney disease, or unspecified chronic kidney disease: Secondary | ICD-10-CM | POA: Diagnosis not present

## 2017-08-26 DIAGNOSIS — N183 Chronic kidney disease, stage 3 (moderate): Secondary | ICD-10-CM | POA: Diagnosis not present

## 2017-08-26 DIAGNOSIS — R801 Persistent proteinuria, unspecified: Secondary | ICD-10-CM | POA: Diagnosis not present

## 2017-08-26 DIAGNOSIS — E1121 Type 2 diabetes mellitus with diabetic nephropathy: Secondary | ICD-10-CM | POA: Diagnosis not present

## 2017-09-01 ENCOUNTER — Ambulatory Visit: Payer: Medicare Other | Admitting: Podiatry

## 2017-09-15 DIAGNOSIS — H353221 Exudative age-related macular degeneration, left eye, with active choroidal neovascularization: Secondary | ICD-10-CM | POA: Diagnosis not present

## 2017-09-16 DIAGNOSIS — E1121 Type 2 diabetes mellitus with diabetic nephropathy: Secondary | ICD-10-CM | POA: Diagnosis not present

## 2017-09-16 DIAGNOSIS — I129 Hypertensive chronic kidney disease with stage 1 through stage 4 chronic kidney disease, or unspecified chronic kidney disease: Secondary | ICD-10-CM | POA: Diagnosis not present

## 2017-09-16 DIAGNOSIS — N183 Chronic kidney disease, stage 3 (moderate): Secondary | ICD-10-CM | POA: Diagnosis not present

## 2017-09-16 DIAGNOSIS — Z7984 Long term (current) use of oral hypoglycemic drugs: Secondary | ICD-10-CM | POA: Diagnosis not present

## 2017-10-01 ENCOUNTER — Encounter: Payer: Self-pay | Admitting: Podiatry

## 2017-10-01 ENCOUNTER — Ambulatory Visit (INDEPENDENT_AMBULATORY_CARE_PROVIDER_SITE_OTHER): Payer: Medicare Other | Admitting: Podiatry

## 2017-10-01 DIAGNOSIS — B351 Tinea unguium: Secondary | ICD-10-CM | POA: Diagnosis not present

## 2017-10-01 DIAGNOSIS — M79676 Pain in unspecified toe(s): Secondary | ICD-10-CM

## 2017-10-01 DIAGNOSIS — E1142 Type 2 diabetes mellitus with diabetic polyneuropathy: Secondary | ICD-10-CM | POA: Diagnosis not present

## 2017-10-01 NOTE — Progress Notes (Signed)
Complaint:  Visit Type: Patient returns to my office for continued preventative foot care services. Complaint: Patient states" my nails have grown long and thick and become painful to walk and wear shoes" Patient has been diagnosed with DM with no foot complications. The patient presents for preventative foot care services. No changes to ROS  Podiatric Exam: Vascular: dorsalis pedis and posterior tibial pulses are weakly  palpable bilateral. Capillary return is immediate. Temperature gradient is WNL. Skin turgor WNL  Sensorium: Diminished  Semmes Weinstein monofilament test. Normal tactile sensation bilaterally. Nail Exam: Pt has thick disfigured discolored nails with subungual debris noted hallux nails  B/L and second right. Ulcer Exam: There is no evidence of ulcer or pre-ulcerative changes or infection. Orthopedic Exam: Muscle tone and strength are WNL. No limitations in general ROM. No crepitus or effusions noted. Foot type and digits show no abnormalities. Bony prominences are unremarkable. Skin: No Porokeratosis. No infection or ulcers  Diagnosis:  Onychomycosis, , Pain in right toe, pain in left toes  Treatment & Plan Procedures and Treatment: Consent by patient was obtained for treatment procedures.   Debridement of mycotic and hypertrophic toenails, 1 through 5 bilateral and clearing of subungual debris. No ulceration, no infection noted.  Return Visit-Office Procedure: Patient instructed to return to the office for a follow up visit 3 months for continued evaluation and treatment.    Emina Ribaudo DPM 

## 2017-10-14 DIAGNOSIS — N183 Chronic kidney disease, stage 3 (moderate): Secondary | ICD-10-CM | POA: Diagnosis not present

## 2017-10-14 DIAGNOSIS — H01003 Unspecified blepharitis right eye, unspecified eyelid: Secondary | ICD-10-CM | POA: Diagnosis not present

## 2017-10-14 DIAGNOSIS — I129 Hypertensive chronic kidney disease with stage 1 through stage 4 chronic kidney disease, or unspecified chronic kidney disease: Secondary | ICD-10-CM | POA: Diagnosis not present

## 2017-10-14 DIAGNOSIS — E1121 Type 2 diabetes mellitus with diabetic nephropathy: Secondary | ICD-10-CM | POA: Diagnosis not present

## 2017-11-09 DIAGNOSIS — H43813 Vitreous degeneration, bilateral: Secondary | ICD-10-CM | POA: Diagnosis not present

## 2017-11-09 DIAGNOSIS — H353111 Nonexudative age-related macular degeneration, right eye, early dry stage: Secondary | ICD-10-CM | POA: Diagnosis not present

## 2017-11-09 DIAGNOSIS — H353221 Exudative age-related macular degeneration, left eye, with active choroidal neovascularization: Secondary | ICD-10-CM | POA: Diagnosis not present

## 2017-12-15 DIAGNOSIS — H353221 Exudative age-related macular degeneration, left eye, with active choroidal neovascularization: Secondary | ICD-10-CM | POA: Diagnosis not present

## 2017-12-29 ENCOUNTER — Ambulatory Visit (INDEPENDENT_AMBULATORY_CARE_PROVIDER_SITE_OTHER): Payer: Medicare Other | Admitting: Podiatry

## 2017-12-29 ENCOUNTER — Telehealth: Payer: Self-pay | Admitting: Podiatry

## 2017-12-29 ENCOUNTER — Encounter: Payer: Self-pay | Admitting: Podiatry

## 2017-12-29 DIAGNOSIS — B351 Tinea unguium: Secondary | ICD-10-CM | POA: Diagnosis not present

## 2017-12-29 DIAGNOSIS — M79676 Pain in unspecified toe(s): Secondary | ICD-10-CM | POA: Diagnosis not present

## 2017-12-29 DIAGNOSIS — E1142 Type 2 diabetes mellitus with diabetic polyneuropathy: Secondary | ICD-10-CM

## 2017-12-29 MED ORDER — AMMONIUM LACTATE 12 % EX LOTN: TOPICAL_LOTION | CUTANEOUS | 1 refills | Status: DC

## 2017-12-29 NOTE — Progress Notes (Signed)
Complaint:  Visit Type: Patient returns to my office for continued preventative foot care services. Complaint: Patient states" my nails have grown long and thick and become painful to walk and wear shoes" Patient has been diagnosed with DM with no foot complications. The patient presents for preventative foot care services. No changes to ROS  Podiatric Exam: Vascular: dorsalis pedis and posterior tibial pulses are weakly  palpable bilateral. Capillary return is immediate. Temperature gradient is WNL. Skin turgor WNL  Sensorium: Diminished  Semmes Weinstein monofilament test. Normal tactile sensation bilaterally. Nail Exam: Pt has thick disfigured discolored nails with subungual debris noted hallux nails  B/L and second right. Ulcer Exam: There is no evidence of ulcer or pre-ulcerative changes or infection. Orthopedic Exam: Muscle tone and strength are WNL. No limitations in general ROM. No crepitus or effusions noted. Foot type and digits show no abnormalities. Bony prominences are unremarkable. Skin: No Porokeratosis. No infection or ulcers  Diagnosis:  Onychomycosis, , Pain in right toe, pain in left toes  Treatment & Plan Procedures and Treatment: Consent by patient was obtained for treatment procedures.   Debridement of mycotic and hypertrophic toenails, 1 through 5 bilateral and clearing of subungual debris. No ulceration, no infection noted.  Return Visit-Office Procedure: Patient instructed to return to the office for a follow up visit 3 months for continued evaluation and treatment.    Gerron Guidotti DPM 

## 2017-12-29 NOTE — Addendum Note (Signed)
Addended by: Alphia Kava'CONNELL, VALERY D on: 12/29/2017 04:54 PM   Modules accepted: Orders

## 2017-12-29 NOTE — Telephone Encounter (Signed)
Dr. Stacie AcresMayer ordered Lac Hydrin lotion apply to legs and feet, but not toes at bedtime.  Left message with orders called to Ms Rosalyn GessGrayson [facility] and [facility]

## 2017-12-29 NOTE — Telephone Encounter (Signed)
This is Melida GimenezJennifer Grayson, [facility]. You guys saw Ms. Natalie Peterson today and I was wondering if you could give me a phone call back at [phone]. We were just wondering if there is a prescription cream or lotion that the doctor recommends for her legs and feet for the scaliness. On here it was saying that she was seen for pain and she said she's not having any pain and she did get her toenails cut today. We were concerned about her scaliness of her foot and legs although she said she has had it since she was young. Thank you for the phone call back, I appreciate it.

## 2018-01-21 DIAGNOSIS — I129 Hypertensive chronic kidney disease with stage 1 through stage 4 chronic kidney disease, or unspecified chronic kidney disease: Secondary | ICD-10-CM | POA: Diagnosis not present

## 2018-01-21 DIAGNOSIS — R269 Unspecified abnormalities of gait and mobility: Secondary | ICD-10-CM | POA: Diagnosis not present

## 2018-01-21 DIAGNOSIS — E1121 Type 2 diabetes mellitus with diabetic nephropathy: Secondary | ICD-10-CM | POA: Diagnosis not present

## 2018-01-21 DIAGNOSIS — N183 Chronic kidney disease, stage 3 (moderate): Secondary | ICD-10-CM | POA: Diagnosis not present

## 2018-01-26 DIAGNOSIS — I10 Essential (primary) hypertension: Secondary | ICD-10-CM | POA: Diagnosis not present

## 2018-01-26 DIAGNOSIS — R2689 Other abnormalities of gait and mobility: Secondary | ICD-10-CM | POA: Diagnosis not present

## 2018-01-26 DIAGNOSIS — M6281 Muscle weakness (generalized): Secondary | ICD-10-CM | POA: Diagnosis not present

## 2018-01-27 DIAGNOSIS — H353221 Exudative age-related macular degeneration, left eye, with active choroidal neovascularization: Secondary | ICD-10-CM | POA: Diagnosis not present

## 2018-01-28 DIAGNOSIS — M6281 Muscle weakness (generalized): Secondary | ICD-10-CM | POA: Diagnosis not present

## 2018-01-28 DIAGNOSIS — R2689 Other abnormalities of gait and mobility: Secondary | ICD-10-CM | POA: Diagnosis not present

## 2018-01-28 DIAGNOSIS — I10 Essential (primary) hypertension: Secondary | ICD-10-CM | POA: Diagnosis not present

## 2018-01-31 DIAGNOSIS — M6281 Muscle weakness (generalized): Secondary | ICD-10-CM | POA: Diagnosis not present

## 2018-01-31 DIAGNOSIS — R2689 Other abnormalities of gait and mobility: Secondary | ICD-10-CM | POA: Diagnosis not present

## 2018-01-31 DIAGNOSIS — I10 Essential (primary) hypertension: Secondary | ICD-10-CM | POA: Diagnosis not present

## 2018-02-01 DIAGNOSIS — R2689 Other abnormalities of gait and mobility: Secondary | ICD-10-CM | POA: Diagnosis not present

## 2018-02-01 DIAGNOSIS — M6281 Muscle weakness (generalized): Secondary | ICD-10-CM | POA: Diagnosis not present

## 2018-02-01 DIAGNOSIS — I10 Essential (primary) hypertension: Secondary | ICD-10-CM | POA: Diagnosis not present

## 2018-02-02 DIAGNOSIS — R2689 Other abnormalities of gait and mobility: Secondary | ICD-10-CM | POA: Diagnosis not present

## 2018-02-02 DIAGNOSIS — M6281 Muscle weakness (generalized): Secondary | ICD-10-CM | POA: Diagnosis not present

## 2018-02-02 DIAGNOSIS — I10 Essential (primary) hypertension: Secondary | ICD-10-CM | POA: Diagnosis not present

## 2018-02-03 DIAGNOSIS — I129 Hypertensive chronic kidney disease with stage 1 through stage 4 chronic kidney disease, or unspecified chronic kidney disease: Secondary | ICD-10-CM | POA: Diagnosis not present

## 2018-02-03 DIAGNOSIS — R2689 Other abnormalities of gait and mobility: Secondary | ICD-10-CM | POA: Diagnosis not present

## 2018-02-03 DIAGNOSIS — N183 Chronic kidney disease, stage 3 (moderate): Secondary | ICD-10-CM | POA: Diagnosis not present

## 2018-02-03 DIAGNOSIS — Z0000 Encounter for general adult medical examination without abnormal findings: Secondary | ICD-10-CM | POA: Diagnosis not present

## 2018-02-03 DIAGNOSIS — I10 Essential (primary) hypertension: Secondary | ICD-10-CM | POA: Diagnosis not present

## 2018-02-03 DIAGNOSIS — E669 Obesity, unspecified: Secondary | ICD-10-CM | POA: Diagnosis not present

## 2018-02-03 DIAGNOSIS — M6281 Muscle weakness (generalized): Secondary | ICD-10-CM | POA: Diagnosis not present

## 2018-02-04 DIAGNOSIS — R2689 Other abnormalities of gait and mobility: Secondary | ICD-10-CM | POA: Diagnosis not present

## 2018-02-04 DIAGNOSIS — I10 Essential (primary) hypertension: Secondary | ICD-10-CM | POA: Diagnosis not present

## 2018-02-04 DIAGNOSIS — M6281 Muscle weakness (generalized): Secondary | ICD-10-CM | POA: Diagnosis not present

## 2018-02-07 DIAGNOSIS — I10 Essential (primary) hypertension: Secondary | ICD-10-CM | POA: Diagnosis not present

## 2018-02-07 DIAGNOSIS — E119 Type 2 diabetes mellitus without complications: Secondary | ICD-10-CM | POA: Diagnosis not present

## 2018-02-07 DIAGNOSIS — R2689 Other abnormalities of gait and mobility: Secondary | ICD-10-CM | POA: Diagnosis not present

## 2018-02-07 DIAGNOSIS — M6281 Muscle weakness (generalized): Secondary | ICD-10-CM | POA: Diagnosis not present

## 2018-02-08 DIAGNOSIS — M6281 Muscle weakness (generalized): Secondary | ICD-10-CM | POA: Diagnosis not present

## 2018-02-08 DIAGNOSIS — I10 Essential (primary) hypertension: Secondary | ICD-10-CM | POA: Diagnosis not present

## 2018-02-08 DIAGNOSIS — R2689 Other abnormalities of gait and mobility: Secondary | ICD-10-CM | POA: Diagnosis not present

## 2018-03-10 DIAGNOSIS — H353221 Exudative age-related macular degeneration, left eye, with active choroidal neovascularization: Secondary | ICD-10-CM | POA: Diagnosis not present

## 2018-03-11 DIAGNOSIS — L129 Pemphigoid, unspecified: Secondary | ICD-10-CM | POA: Diagnosis not present

## 2018-03-23 DIAGNOSIS — E119 Type 2 diabetes mellitus without complications: Secondary | ICD-10-CM | POA: Diagnosis not present

## 2018-03-23 DIAGNOSIS — E1121 Type 2 diabetes mellitus with diabetic nephropathy: Secondary | ICD-10-CM | POA: Diagnosis not present

## 2018-03-30 ENCOUNTER — Ambulatory Visit (INDEPENDENT_AMBULATORY_CARE_PROVIDER_SITE_OTHER): Payer: Medicare Other | Admitting: Podiatry

## 2018-03-30 ENCOUNTER — Encounter: Payer: Self-pay | Admitting: Podiatry

## 2018-03-30 DIAGNOSIS — E1142 Type 2 diabetes mellitus with diabetic polyneuropathy: Secondary | ICD-10-CM

## 2018-03-30 DIAGNOSIS — B351 Tinea unguium: Secondary | ICD-10-CM | POA: Diagnosis not present

## 2018-03-30 DIAGNOSIS — M79676 Pain in unspecified toe(s): Secondary | ICD-10-CM | POA: Diagnosis not present

## 2018-03-30 NOTE — Progress Notes (Signed)
Complaint:  Visit Type: Patient returns to my office for continued preventative foot care services. Complaint: Patient states" my nails have grown long and thick and become painful to walk and wear shoes" Patient has been diagnosed with DM with no foot complications. The patient presents for preventative foot care services. No changes to ROS  Podiatric Exam: Vascular: dorsalis pedis and posterior tibial pulses are weakly  palpable bilateral. Capillary return is immediate. Temperature gradient is WNL. Skin turgor WNL  Sensorium: Diminished  Semmes Weinstein monofilament test. Normal tactile sensation bilaterally. Nail Exam: Pt has thick disfigured discolored nails with subungual debris noted hallux nails  B/L and second right. Ulcer Exam: There is no evidence of ulcer or pre-ulcerative changes or infection. Orthopedic Exam: Muscle tone and strength are WNL. No limitations in general ROM. No crepitus or effusions noted. Foot type and digits show no abnormalities. Bony prominences are unremarkable. Skin: No Porokeratosis. No infection or ulcers  Diagnosis:  Onychomycosis, , Pain in right toe, pain in left toes  Treatment & Plan Procedures and Treatment: Consent by patient was obtained for treatment procedures.   Debridement of mycotic and hypertrophic toenails, 1 through 5 bilateral and clearing of subungual debris. No ulceration, no infection noted.  Return Visit-Office Procedure: Patient instructed to return to the office for a follow up visit 3 months for continued evaluation and treatment.    Chalon Zobrist DPM 

## 2018-04-01 ENCOUNTER — Other Ambulatory Visit: Payer: Self-pay

## 2018-04-01 ENCOUNTER — Inpatient Hospital Stay (HOSPITAL_COMMUNITY): Payer: Medicare Other

## 2018-04-01 ENCOUNTER — Emergency Department (HOSPITAL_COMMUNITY): Payer: Medicare Other

## 2018-04-01 ENCOUNTER — Inpatient Hospital Stay (HOSPITAL_COMMUNITY)
Admission: EM | Admit: 2018-04-01 | Discharge: 2018-04-05 | DRG: 066 | Disposition: A | Discharge status: Skilled Nursing Facility | Payer: Medicare Other | Attending: Family Medicine | Admitting: Family Medicine

## 2018-04-01 ENCOUNTER — Encounter (HOSPITAL_COMMUNITY): Payer: Self-pay

## 2018-04-01 DIAGNOSIS — E785 Hyperlipidemia, unspecified: Secondary | ICD-10-CM | POA: Diagnosis present

## 2018-04-01 DIAGNOSIS — I63312 Cerebral infarction due to thrombosis of left middle cerebral artery: Secondary | ICD-10-CM | POA: Diagnosis not present

## 2018-04-01 DIAGNOSIS — F0280 Dementia in other diseases classified elsewhere without behavioral disturbance: Secondary | ICD-10-CM | POA: Diagnosis not present

## 2018-04-01 DIAGNOSIS — I447 Left bundle-branch block, unspecified: Secondary | ICD-10-CM | POA: Diagnosis present

## 2018-04-01 DIAGNOSIS — T7840XD Allergy, unspecified, subsequent encounter: Secondary | ICD-10-CM | POA: Diagnosis not present

## 2018-04-01 DIAGNOSIS — I63532 Cerebral infarction due to unspecified occlusion or stenosis of left posterior cerebral artery: Secondary | ICD-10-CM | POA: Diagnosis not present

## 2018-04-01 DIAGNOSIS — R4020 Unspecified coma: Secondary | ICD-10-CM | POA: Diagnosis not present

## 2018-04-01 DIAGNOSIS — R001 Bradycardia, unspecified: Secondary | ICD-10-CM | POA: Diagnosis not present

## 2018-04-01 DIAGNOSIS — R2689 Other abnormalities of gait and mobility: Secondary | ICD-10-CM | POA: Diagnosis not present

## 2018-04-01 DIAGNOSIS — R402362 Coma scale, best motor response, obeys commands, at arrival to emergency department: Secondary | ICD-10-CM | POA: Diagnosis not present

## 2018-04-01 DIAGNOSIS — I6300 Cerebral infarction due to thrombosis of unspecified precerebral artery: Secondary | ICD-10-CM | POA: Diagnosis not present

## 2018-04-01 DIAGNOSIS — Z79899 Other long term (current) drug therapy: Secondary | ICD-10-CM | POA: Diagnosis not present

## 2018-04-01 DIAGNOSIS — Z7984 Long term (current) use of oral hypoglycemic drugs: Secondary | ICD-10-CM | POA: Diagnosis not present

## 2018-04-01 DIAGNOSIS — R4701 Aphasia: Secondary | ICD-10-CM | POA: Diagnosis not present

## 2018-04-01 DIAGNOSIS — I639 Cerebral infarction, unspecified: Secondary | ICD-10-CM | POA: Diagnosis not present

## 2018-04-01 DIAGNOSIS — R402242 Coma scale, best verbal response, confused conversation, at arrival to emergency department: Secondary | ICD-10-CM | POA: Diagnosis not present

## 2018-04-01 DIAGNOSIS — I350 Nonrheumatic aortic (valve) stenosis: Secondary | ICD-10-CM | POA: Diagnosis not present

## 2018-04-01 DIAGNOSIS — E119 Type 2 diabetes mellitus without complications: Secondary | ICD-10-CM | POA: Diagnosis not present

## 2018-04-01 DIAGNOSIS — R4182 Altered mental status, unspecified: Secondary | ICD-10-CM | POA: Diagnosis not present

## 2018-04-01 DIAGNOSIS — I63512 Cerebral infarction due to unspecified occlusion or stenosis of left middle cerebral artery: Principal | ICD-10-CM | POA: Diagnosis present

## 2018-04-01 DIAGNOSIS — I63412 Cerebral infarction due to embolism of left middle cerebral artery: Secondary | ICD-10-CM

## 2018-04-01 DIAGNOSIS — K5900 Constipation, unspecified: Secondary | ICD-10-CM | POA: Diagnosis not present

## 2018-04-01 DIAGNOSIS — I10 Essential (primary) hypertension: Secondary | ICD-10-CM | POA: Diagnosis not present

## 2018-04-01 DIAGNOSIS — R234 Changes in skin texture: Secondary | ICD-10-CM | POA: Diagnosis not present

## 2018-04-01 DIAGNOSIS — Z8673 Personal history of transient ischemic attack (TIA), and cerebral infarction without residual deficits: Secondary | ICD-10-CM | POA: Diagnosis present

## 2018-04-01 DIAGNOSIS — E569 Vitamin deficiency, unspecified: Secondary | ICD-10-CM | POA: Diagnosis not present

## 2018-04-01 DIAGNOSIS — I69320 Aphasia following cerebral infarction: Secondary | ICD-10-CM | POA: Diagnosis not present

## 2018-04-01 DIAGNOSIS — R402142 Coma scale, eyes open, spontaneous, at arrival to emergency department: Secondary | ICD-10-CM | POA: Diagnosis present

## 2018-04-01 DIAGNOSIS — R410 Disorientation, unspecified: Secondary | ICD-10-CM | POA: Diagnosis not present

## 2018-04-01 DIAGNOSIS — R29704 NIHSS score 4: Secondary | ICD-10-CM | POA: Diagnosis not present

## 2018-04-01 DIAGNOSIS — Z7401 Bed confinement status: Secondary | ICD-10-CM | POA: Diagnosis not present

## 2018-04-01 DIAGNOSIS — Z111 Encounter for screening for respiratory tuberculosis: Secondary | ICD-10-CM | POA: Diagnosis not present

## 2018-04-01 DIAGNOSIS — F0390 Unspecified dementia without behavioral disturbance: Secondary | ICD-10-CM | POA: Diagnosis not present

## 2018-04-01 DIAGNOSIS — H04129 Dry eye syndrome of unspecified lacrimal gland: Secondary | ICD-10-CM | POA: Diagnosis not present

## 2018-04-01 DIAGNOSIS — M6281 Muscle weakness (generalized): Secondary | ICD-10-CM | POA: Diagnosis not present

## 2018-04-01 DIAGNOSIS — R41841 Cognitive communication deficit: Secondary | ICD-10-CM | POA: Diagnosis not present

## 2018-04-01 DIAGNOSIS — M2550 Pain in unspecified joint: Secondary | ICD-10-CM | POA: Diagnosis not present

## 2018-04-01 DIAGNOSIS — I495 Sick sinus syndrome: Secondary | ICD-10-CM | POA: Diagnosis not present

## 2018-04-01 DIAGNOSIS — R5383 Other fatigue: Secondary | ICD-10-CM | POA: Diagnosis not present

## 2018-04-01 DIAGNOSIS — I340 Nonrheumatic mitral (valve) insufficiency: Secondary | ICD-10-CM | POA: Diagnosis not present

## 2018-04-01 DIAGNOSIS — D72829 Elevated white blood cell count, unspecified: Secondary | ICD-10-CM | POA: Diagnosis not present

## 2018-04-01 HISTORY — DX: Essential (primary) hypertension: I10

## 2018-04-01 HISTORY — DX: Unspecified dementia, unspecified severity, without behavioral disturbance, psychotic disturbance, mood disturbance, and anxiety: F03.90

## 2018-04-01 LAB — CREATININE, SERUM
Creatinine, Ser: 0.94 mg/dL (ref 0.44–1.00)
GFR calc Af Amer: >60 mL/min (ref >60)
GFR calc non Af Amer: 54 mL/min — ABNORMAL LOW (ref >60)

## 2018-04-01 LAB — URINALYSIS, ROUTINE W REFLEX MICROSCOPIC
BILIRUBIN URINE: NEGATIVE
Glucose, UA: NEGATIVE mg/dL
Hgb urine dipstick: NEGATIVE
Ketones, ur: NEGATIVE mg/dL
Leukocytes, UA: NEGATIVE
Nitrite: NEGATIVE
Protein, ur: NEGATIVE mg/dL
Specific Gravity, Urine: 1.009 (ref 1.005–1.030)
pH: 6 (ref 5.0–8.0)

## 2018-04-01 LAB — CBC
HCT: 45.5 % (ref 36.0–46.0)
Hemoglobin: 15 g/dL (ref 12.0–15.0)
MCH: 30.7 pg (ref 26.0–34.0)
MCHC: 33 g/dL (ref 30.0–36.0)
MCV: 93 fL (ref 80.0–100.0)
Platelets: 234 10*3/uL (ref 150–400)
RBC: 4.89 MIL/uL (ref 3.87–5.11)
RDW: 12.4 % (ref 11.5–15.5)
WBC: 11.3 10*3/uL — ABNORMAL HIGH (ref 4.0–10.5)
nRBC: 0 % (ref 0.0–0.2)

## 2018-04-01 LAB — COMPREHENSIVE METABOLIC PANEL
ALT: 18 U/L (ref 0–44)
AST: 22 U/L (ref 15–41)
Albumin: 3.4 g/dL — ABNORMAL LOW (ref 3.5–5.0)
Alkaline Phosphatase: 43 U/L (ref 38–126)
Anion gap: 5 (ref 5–15)
BUN: 24 mg/dL — ABNORMAL HIGH (ref 8–23)
CO2: 30 mmol/L (ref 22–32)
Calcium: 10 mg/dL (ref 8.9–10.3)
Chloride: 102 mmol/L (ref 98–111)
Creatinine, Ser: 1.07 mg/dL — ABNORMAL HIGH (ref 0.44–1.00)
GFR calc Af Amer: 53 mL/min — ABNORMAL LOW (ref >60)
GFR, EST NON AFRICAN AMERICAN: 46 mL/min — AB (ref >60)
Glucose, Bld: 139 mg/dL — ABNORMAL HIGH (ref 70–99)
Potassium: 4.6 mmol/L (ref 3.5–5.1)
Sodium: 137 mmol/L (ref 135–145)
Total Bilirubin: 0.8 mg/dL (ref 0.3–1.2)
Total Protein: 6.7 g/dL (ref 6.5–8.1)

## 2018-04-01 LAB — GLUCOSE, CAPILLARY
Glucose-Capillary: 109 mg/dL — ABNORMAL HIGH (ref 70–99)
Glucose-Capillary: 111 mg/dL — ABNORMAL HIGH (ref 70–99)

## 2018-04-01 LAB — CBG MONITORING, ED: Glucose-Capillary: 133 mg/dL — ABNORMAL HIGH (ref 70–99)

## 2018-04-01 MED ORDER — ACETAMINOPHEN 650 MG RE SUPP: 650.0000 mg | RECTAL | Status: DC | PRN

## 2018-04-01 MED ORDER — STROKE: EARLY STAGES OF RECOVERY BOOK
Freq: Once | Status: AC
  Administered 2018-04-01: 22:00:00
  Filled 2018-04-01: qty 1

## 2018-04-01 MED ORDER — ACETAMINOPHEN 160 MG/5ML PO SOLN: 650.0000 mg | ORAL | Status: DC | PRN

## 2018-04-01 MED ORDER — ASPIRIN 300 MG RE SUPP
300.0000 mg | Freq: Once | RECTAL | Status: AC
  Administered 2018-04-01: 300 mg via RECTAL
  Filled 2018-04-01: qty 1

## 2018-04-01 MED ORDER — ONDANSETRON HCL 4 MG/2ML IJ SOLN: 4.0000 mg | Freq: Four times a day (QID) | INTRAMUSCULAR | Status: DC | PRN

## 2018-04-01 MED ORDER — INSULIN ASPART 100 UNIT/ML ~~LOC~~ SOLN: 0.0000 [IU] | SUBCUTANEOUS | Status: DC

## 2018-04-01 MED ORDER — ACETAMINOPHEN 325 MG PO TABS
650.0000 mg | ORAL_TABLET | ORAL | Status: DC | PRN
  Administered 2018-04-04: 650 mg via ORAL
  Filled 2018-04-01: qty 2

## 2018-04-01 MED ORDER — SODIUM CHLORIDE 0.9 % IV SOLN
INTRAVENOUS | Status: DC
  Administered 2018-04-02 – 2018-04-03 (×4): via INTRAVENOUS

## 2018-04-01 MED ORDER — ENOXAPARIN SODIUM 40 MG/0.4ML ~~LOC~~ SOLN
40.0000 mg | SUBCUTANEOUS | Status: DC
  Administered 2018-04-01 – 2018-04-04 (×4): 40 mg via SUBCUTANEOUS
  Filled 2018-04-01 (×4): qty 0.4

## 2018-04-01 MED ORDER — SENNOSIDES-DOCUSATE SODIUM 8.6-50 MG PO TABS
1.0000 | ORAL_TABLET | Freq: Every day | ORAL | Status: DC
  Administered 2018-04-01 – 2018-04-04 (×4): 1 via ORAL
  Filled 2018-04-01 (×4): qty 1

## 2018-04-01 MED ORDER — INSULIN ASPART 100 UNIT/ML ~~LOC~~ SOLN
0.0000 [IU] | Freq: Three times a day (TID) | SUBCUTANEOUS | Status: DC
  Administered 2018-04-03 – 2018-04-04 (×2): 2 [IU] via SUBCUTANEOUS
  Administered 2018-04-04: 1 [IU] via SUBCUTANEOUS

## 2018-04-01 NOTE — ED Notes (Signed)
ED TO INPATIENT HANDOFF REPORT  Name/Age/Gender Natalie Peterson 84 y.o. female  Code Status   Home/SNF/Other Nursing Home  Chief Complaint Sent from facility  Level of Care/Admitting Diagnosis ED Disposition    ED Disposition Condition Comment   Admit  Hospital Area: [facility] [[id number]]  Level of Care: Telemetry [5]  Diagnosis: CVA (cerebral vascular accident) [facility]) [[id number]]  Admitting Physician: Albertine Grates [[id number]]  Attending Physician: Albertine Grates [[id number]]  Estimated length of stay: past midnight tomorrow  Certification:: I certify this patient will need inpatient services for at least 2 midnights  PT Class (Do Not Modify): Inpatient [101]  PT Acc Code (Do Not Modify): Private [1]       Medical History Past Medical History:  Diagnosis Date  . Dementia (HCC)   . Diabetes mellitus without complication (HCC)   . Hypertension     Allergies No Known Allergies  IV Location/Drains/Wounds Patient Lines/Drains/Airways Status   Active Line/Drains/Airways    Name:   Placement date:   Placement time:   Site:   Days:   Peripheral IV 04/01/18 Right Antecubital   04/01/18    1227    Antecubital   less than 1          Labs/Imaging Results for orders placed or performed during the hospital encounter of 04/01/18 (from the past 48 hour(s))  CBG monitoring, ED     Status: Abnormal   Collection Time: 04/01/18 12:15 PM  Result Value Ref Range   Glucose-Capillary 133 (H) 70 - 99 mg/dL  Comprehensive metabolic panel     Status: Abnormal   Collection Time: 04/01/18 12:25 PM  Result Value Ref Range   Sodium 137 135 - 145 mmol/L   Potassium 4.6 3.5 - 5.1 mmol/L   Chloride 102 98 - 111 mmol/L   CO2 30 22 - 32 mmol/L   Glucose, Bld 139 (H) 70 - 99 mg/dL   BUN 24 (H) 8 - 23 mg/dL   Creatinine, Ser 5.78 (H) 0.44 - 1.00 mg/dL   Calcium 46.9 8.9 - 62.9 mg/dL   Total Protein 6.7 6.5 - 8.1 g/dL   Albumin 3.4 (L) 3.5 - 5.0 g/dL   AST 22 15 - 41 U/L   ALT 18 0  - 44 U/L   Alkaline Phosphatase 43 38 - 126 U/L   Total Bilirubin 0.8 0.3 - 1.2 mg/dL   GFR calc non Af Amer 46 (L) >60 mL/min   GFR calc Af Amer 53 (L) >60 mL/min   Anion gap 5 5 - 15    Comment: Performed at [facility], [address] [place], [place] [address]  CBC     Status: Abnormal   Collection Time: 04/01/18 12:25 PM  Result Value Ref Range   WBC 11.3 (H) 4.0 - 10.5 K/uL   RBC 4.89 3.87 - 5.11 MIL/uL   Hemoglobin 15.0 12.0 - 15.0 g/dL   HCT 32.4 40.1 - 02.7 %   MCV 93.0 80.0 - 100.0 fL   MCH 30.7 26.0 - 34.0 pg   MCHC 33.0 30.0 - 36.0 g/dL   RDW 25.3 66.4 - 40.3 %   Platelets 234 150 - 400 K/uL   nRBC 0.0 0.0 - 0.2 %    Comment: Performed at [facility], [address] [place], [place] [address]  Urinalysis, Routine w reflex microscopic     Status: None   Collection Time: 04/01/18  2:19 PM  Result Value Ref Range  Color, Urine YELLOW YELLOW   APPearance CLEAR CLEAR   Specific Gravity, Urine 1.009 1.005 - 1.030   pH 6.0 5.0 - 8.0   Glucose, UA NEGATIVE NEGATIVE mg/dL   Hgb urine dipstick NEGATIVE NEGATIVE   Bilirubin Urine NEGATIVE NEGATIVE   Ketones, ur NEGATIVE NEGATIVE mg/dL   Protein, ur NEGATIVE NEGATIVE mg/dL   Nitrite NEGATIVE NEGATIVE   Leukocytes, UA NEGATIVE NEGATIVE    Comment: Performed at [facility], [address] [place], [place] [address]   Dg Chest 2 View  Result Date: 04/01/2018 CLINICAL DATA:  Altered mental status EXAM: CHEST - 2 VIEW COMPARISON:  None. FINDINGS: Normal heart size. Lungs are under aerated and grossly clear. No pneumothorax. No pleural effusion. IMPRESSION: No active cardiopulmonary disease. Electronically Signed   By: Jolaine ClickArthur  Hoss M.D.   On: 04/01/2018 13:35   Ct Head Wo Contrast  Result Date: 04/01/2018 CLINICAL DATA:  Altered level of consciousness EXAM: CT HEAD WITHOUT CONTRAST TECHNIQUE: Contiguous axial images were obtained from the base of the skull through  the vertex without intravenous contrast. COMPARISON:  None. FINDINGS: Brain: There is an area of low-density in the left parietal lobe compatible with acute to subacute infarct. No hemorrhage. No hydrocephalus or midline shift. Vascular: No hyperdense vessel or unexpected calcification. Skull: No acute calvarial abnormality. Sinuses/Orbits: Visualized paranasal sinuses and mastoids clear. Orbital soft tissues unremarkable. Other: None IMPRESSION: Low-density in the left parietal lobe compatible with acute to subacute infarction. No hemorrhage. Electronically Signed   By: Charlett NoseKevin  Dover M.D.   On: 04/01/2018 16:03    Pending Labs Unresulted Labs (From admission, onward)    Start     Ordered   04/02/18 0500  Hemoglobin A1c  Tomorrow morning,   R     04/01/18 1651   04/02/18 0500  Lipid panel  Tomorrow morning,   R     04/01/18 1651   04/02/18 0500  TSH  Tomorrow morning,   R     04/01/18 1651   04/02/18 0500  CBC with Differential/Platelet  Tomorrow morning,   R     04/01/18 1651   04/02/18 0500  Basic metabolic panel  Tomorrow morning,   R     04/01/18 1651   04/02/18 0500  Magnesium  Tomorrow morning,   R     04/01/18 1651   04/01/18 1307  Urine culture  ONCE - STAT,   STAT     04/01/18 1306   Signed and Held  CBC  (enoxaparin (LOVENOX)    CrCl >/= 30 ml/min)  Once,   R    Comments:  Baseline for enoxaparin therapy IF NOT ALREADY DRAWN.  Notify MD if PLT < 100 K.    Signed and Held   Signed and Held  Creatinine, serum  (enoxaparin (LOVENOX)    CrCl >/= 30 ml/min)  Once,   R    Comments:  Baseline for enoxaparin therapy IF NOT ALREADY DRAWN.    Signed and Held   Signed and Held  Creatinine, serum  (enoxaparin (LOVENOX)    CrCl >/= 30 ml/min)  Weekly,   R    Comments:  while on enoxaparin therapy    Signed and Held          Vitals/Pain Today's Vitals   04/01/18 1300 04/01/18 1427 04/01/18 1644 04/01/18 1748  BP: (!) 132/54 (!) 136/59 134/80 (!) 130/54  Pulse: 70 75 76 80  Resp:  15 17 16 18   Temp:  TempSrc:      SpO2: 98% 98% 98% 97%    Isolation Precautions No active isolations  Medications Medications  aspirin suppository 300 mg (has no administration in time range)  insulin aspart (novoLOG) injection 0-9 Units (has no administration in time range)    Mobility walks

## 2018-04-01 NOTE — ED Notes (Signed)
Bed: WA09 Expected date:  Expected time:  Means of arrival:  Comments: EMS/AMS 

## 2018-04-01 NOTE — ED Triage Notes (Addendum)
Pt BIB EMS from [facility]. Staff reports that pt has been more altered than usual. EMS reports that she has been "pleasantly confused". Pt was ambulatory to stretcher. Pt has hx of dementia.   96% RA CBG 170 BP 150/80 Hx of HTN HR 72 RR 16

## 2018-04-01 NOTE — Consult Note (Addendum)
Requesting Physician: Dr. Roda ShuttersXu    Chief Complaint:  Altered mental status  History obtained from: Chart  review  HPI:                                                                                                                                       Natalie Peterson is an 84 y.o. female with past medical history significant for diabetes, hypertension, dementia was [facility] living [facility] as patient appeared more confused today and unable to carry on a conversation. Last seen normal was yesterday night.  CT head suggestive for left parietal stroke.  An MRI brain was performed confirmed left MCA acute stroke.   History limited as patient is aphasic and is obtained by chart review.  Date last known well: 11.28.19  tPA Given: No, outside window NIHSS: 4 Baseline MRS 3     Past Medical History:  Diagnosis Date  . Dementia (HCC)   . Diabetes mellitus without complication (HCC)   . Hypertension     History reviewed. No pertinent surgical history.  History reviewed. No pertinent family history. Social History:  reports that she has never smoked. She has never used smokeless tobacco. She reports that she does not drink alcohol or use drugs.  Allergies: No Known Allergies  Medications:                                                                                                                        I reviewed home medications.  Patient not on aspirin at home   ROS:                                                                                                                                     14 systems reviewed and negative except above  Examination:                                                                                                      General: Appears well-developed Psych: Affect appropriate to situation Eyes: No scleral injection HENT: No OP obstrucion Head: Normocephalic.  Cardiovascular: Normal rate and regular  rhythm.  Respiratory: Effort normal and breath sounds normal to anterior ascultation GI: Soft.  No distension. There is no tenderness.  Skin: WDI    Neurological Examination Mental Status: Alert, not oriented to place/time.  Patient unable to name objects or repeat sentences.  States she does not know when questions are asked.  Able to follow simple commands without difficulty. Cranial Nerves: II: Visual fields grossly normal: Blinks to threat bilaterally III,IV, VI: ptosis not present, extra-ocular motions intact bilaterally, pupils equal, round, reactive to light and accommodation V,VII: smile symmetric, facial light touch sensation normal bilaterally XI: bilateral shoulder shrug XII: midline tongue extension Motor: Right : Upper extremity   5/5    Left:     Upper extremity   5/5  Lower extremity   5/5     Lower extremity   5/5 Tone and bulk:normal tone throughout; no atrophy noted Sensory: Difficult to assess, appears to appreciate sensation bilaterally without neglect Deep Tendon Reflexes: 1+ and symmetric throughout Plantars: Right: downgoing   Left: downgoing Cerebellar: No gross ataxia to finger-to-nose Gait: Did not assess gait     Lab Results: Basic Metabolic Panel: Recent Labs  Lab 04/01/18 1225  NA 137  K 4.6  CL 102  CO2 30  GLUCOSE 139*  BUN 24*  CREATININE 1.07*  CALCIUM 10.0    CBC: Recent Labs  Lab 04/01/18 1225  WBC 11.3*  HGB 15.0  HCT 45.5  MCV 93.0  PLT 234    Coagulation Studies: No results for input(s): LABPROT, INR in the last 72 hours.  Imaging: Dg Chest 2 View  Result Date: 04/01/2018 CLINICAL DATA:  Altered mental status EXAM: CHEST - 2 VIEW COMPARISON:  None. FINDINGS: Normal heart size. Lungs are under aerated and grossly clear. No pneumothorax. No pleural effusion. IMPRESSION: No active cardiopulmonary disease. Electronically Signed   By: Jolaine Click M.D.   On: 04/01/2018 13:35   Ct Head Wo Contrast  Result Date:  04/01/2018 CLINICAL DATA:  Altered level of consciousness EXAM: CT HEAD WITHOUT CONTRAST TECHNIQUE: Contiguous axial images were obtained from the base of the skull through the vertex without intravenous contrast. COMPARISON:  None. FINDINGS: Brain: There is an area of low-density in the left parietal lobe compatible with acute to subacute infarct. No hemorrhage. No hydrocephalus or midline shift. Vascular: No hyperdense vessel or unexpected calcification. Skull: No acute calvarial abnormality. Sinuses/Orbits: Visualized paranasal sinuses and mastoids clear. Orbital soft tissues unremarkable. Other: None IMPRESSION: Low-density in the left parietal lobe compatible with acute to subacute infarction. No hemorrhage. Electronically Signed   By: Charlett Nose M.D.   On: 04/01/2018 16:03     ASSESSMENT AND PLAN  84 y.o. female with past medical history significant for diabetes, hypertension, dementia with isolated  aphasia due to left MCA stroke.   Acute Ischemic Stroke   Risk factors: HTN, HLD Etiology: suspect embolic source  Recommend # MRI of the brain without contrast #MRA Head and neck  #Transthoracic Echo  # Start patient on ASA 325mg  daily (will not start dual antiplatelets due to moderate size of infarct)  #Start or continue Atorvastatin 40 mg/other high intensity statin # BP goal: permissive HTN upto 220/120 mmHg ( 185/110 if patient has CHF, CKD) # HBAIC and Lipid profile # Telemetry monitoring # Frequent neuro checks # NPO until passes stroke swallow screen  Please page stroke NP  Or  PA  Or MD from 8am -4 pm  as this patient from this time will be  followed by the stroke.   You can look them up on [email or web address]  Password [facility]       Nabilah Davoli [facility] Pager Number [phone]

## 2018-04-01 NOTE — ED Provider Notes (Addendum)
[facility]-EMERGENCY DEPT Provider Note   CSN: [id number] Arrival date & time: 04/01/18  1111     History   Chief Complaint Chief Complaint  Patient presents with  . Altered Mental Status    HPI Natalie Peterson is a 84 y.o. female presenting for evaluation of altered mental status.  Level 5 caveat, patient with history of dementia.  Per EMS, patient transported to the hospital for being more altered than normal.  No other complaints.  Discussed with the home care director of the facility that the patient is a resident at, [facility].  Per Melanee SpryIan, patient was more confused today than normal.  Normally, she can carry on a small conversation, able to state where she lives and talk about her daughter [name].  Today, she answered all questions with a smile and yes.  EN denies recent fevers or patient complaints.  No recent fall.  Patient is not on blood thinners.  HPI  Past Medical History:  Diagnosis Date  . Dementia (HCC)   . Diabetes mellitus without complication (HCC)   . Hypertension     Patient Active Problem List   Diagnosis Date Noted  . CVA (cerebral vascular accident) (HCC) 04/01/2018    History reviewed. No pertinent surgical history.   OB History   None      Home Medications    Prior to Admission medications   Medication Sig Start Date End Date Taking? Authorizing Provider  acetaminophen (TYLENOL) 500 MG tablet Take 500 mg by mouth every 4 (four) hours as needed for mild pain.   Yes [provider]  ammonium lactate (LAC-HYDRIN) 12 % lotion Apply at bedtime to legs, feet but not to toes. 12/29/17  Yes Helane GuntherMayer, Gregory, DPM  fexofenadine (ALLEGRA) 180 MG tablet Take 180 mg by mouth daily.   Yes [provider]  fluocinonide (LIDEX) 0.05 % external solution Apply 1 application topically 2 (two) times daily as needed (scalp).   Yes [provider]  gabapentin (NEURONTIN) 100 MG capsule Take 100 mg by mouth at bedtime.    Yes [provider]  glimepiride (AMARYL) 4 MG tablet Take 4 mg by mouth daily with breakfast.  03/03/18  Yes [provider]  JANUVIA 100 MG tablet Take 100 mg by mouth daily.  03/03/18  Yes [provider]  lisinopril (PRINIVIL,ZESTRIL) 5 MG tablet Take 5 mg by mouth daily.  11/25/17  Yes [provider]  Multiple Vitamins-Minerals (CEROVITE SENIOR) TABS Take 1 tablet by mouth daily.   Yes [provider]  Polyethyl Glycol-Propyl Glycol (SYSTANE ULTRA) 0.4-0.3 % SOLN Apply 1 drop to eye 4 (four) times daily as needed (dry eyes/eye irritation).   Yes [provider]  polyethylene glycol (MIRALAX / GLYCOLAX) packet Take 17 g by mouth daily.   Yes [provider]    Family History History reviewed. No pertinent family history.  Social History Social History   Tobacco Use  . Smoking status: Never Smoker  . Smokeless tobacco: Never Used  Substance Use Topics  . Alcohol use: No  . Drug use: No     Allergies   Patient has no known allergies.   Review of Systems Review of Systems  Unable to perform ROS: Dementia     Physical Exam Updated Vital Signs BP (!) 130/54   Pulse 80   Temp 97.8 F (36.6 C) (Oral)   Resp 18   SpO2 97%   Physical Exam  Constitutional: She appears well-developed and well-nourished.  No distress.  Elderly female resting comfortably in the bed in no acute distress  HENT:  Head: Normocephalic and atraumatic.  Eyes: Pupils are equal, round, and reactive to light. Conjunctivae and EOM are normal.  Neck: Normal range of motion. Neck supple.  Cardiovascular: Normal rate, regular rhythm and intact distal pulses.  Pulmonary/Chest: Effort normal and breath sounds normal. No respiratory distress. She has no wheezes.  Abdominal: Soft. She exhibits no distension and no mass. There is no tenderness. There is no guarding.  Musculoskeletal: Normal range of motion.  Moving all extremities with purpose.   Strength intact x4.  Neurological: She is alert. GCS eye subscore is 4. GCS verbal subscore is 5. GCS motor subscore is 6.  Patient alert to self, is not alert place, time, event.  Responds to the majority of the questions with "I am feeling fine"  Skin: Skin is warm and dry. Capillary refill takes less than 2 seconds.  Psychiatric: She has a normal mood and affect.  Nursing note and vitals reviewed.    ED Treatments / Results  Labs (all labs ordered are listed, but only abnormal results are displayed) Labs Reviewed  COMPREHENSIVE METABOLIC PANEL - Abnormal; Notable for the following components:      Result Value   Glucose, Bld 139 (*)    BUN 24 (*)    Creatinine, Ser 1.07 (*)    Albumin 3.4 (*)    GFR calc non Af Amer 46 (*)    GFR calc Af Amer 53 (*)    All other components within normal limits  CBC - Abnormal; Notable for the following components:   WBC 11.3 (*)    All other components within normal limits  CBG MONITORING, ED - Abnormal; Notable for the following components:   Glucose-Capillary 133 (*)    All other components within normal limits  URINE CULTURE  URINALYSIS, ROUTINE W REFLEX MICROSCOPIC  HEMOGLOBIN A1C  LIPID PANEL  TSH  CBC WITH DIFFERENTIAL/PLATELET  BASIC METABOLIC PANEL  MAGNESIUM    EKG None  Radiology Dg Chest 2 View  Result Date: 04/01/2018 CLINICAL DATA:  Altered mental status EXAM: CHEST - 2 VIEW COMPARISON:  None. FINDINGS: Normal heart size. Lungs are under aerated and grossly clear. No pneumothorax. No pleural effusion. IMPRESSION: No active cardiopulmonary disease. Electronically Signed   By: Jolaine Click M.D.   On: 04/01/2018 13:35   Ct Head Wo Contrast  Result Date: 04/01/2018 CLINICAL DATA:  Altered level of consciousness EXAM: CT HEAD WITHOUT CONTRAST TECHNIQUE: Contiguous axial images were obtained from the base of the skull through the vertex without intravenous contrast. COMPARISON:  None. FINDINGS: Brain: There is an area of  low-density in the left parietal lobe compatible with acute to subacute infarct. No hemorrhage. No hydrocephalus or midline shift. Vascular: No hyperdense vessel or unexpected calcification. Skull: No acute calvarial abnormality. Sinuses/Orbits: Visualized paranasal sinuses and mastoids clear. Orbital soft tissues unremarkable. Other: None IMPRESSION: Low-density in the left parietal lobe compatible with acute to subacute infarction. No hemorrhage. Electronically Signed   By: Charlett Nose M.D.   On: 04/01/2018 16:03    Procedures .Critical Care Performed by: Alveria Apley, PA-C Authorized by: Alveria Apley, PA-C   Critical care provider statement:    Critical care time (minutes):  45   Critical care time was exclusive of:  Separately billable procedures and treating other patients and teaching time   Critical care was necessary to treat or prevent imminent or life-threatening deterioration of the following  conditions:  CNS failure or compromise   Critical care was time spent personally by me on the following activities:  Blood draw for specimens, development of treatment plan with patient or surrogate, discussions with consultants, evaluation of patient's response to treatment, examination of patient, obtaining history from patient or surrogate, ordering and review of laboratory studies, ordering and performing treatments and interventions, ordering and review of radiographic studies, pulse oximetry, re-evaluation of patient's condition and review of old charts   I assumed direction of critical care for this patient from another provider in my specialty: no   Comments:     Pt with acute/subacute CVA, admitted and transferred to hospital for neurology evaluation/management   (including critical care time)  Medications Ordered in ED Medications  aspirin suppository 300 mg (has no administration in time range)  insulin aspart (novoLOG) injection 0-9 Units (has no administration in time  range)     Initial Impression / Assessment and Plan / ED Course  I have reviewed the triage vital signs and the nursing notes.  Pertinent labs & imaging results that were available during my care of the patient were reviewed by me and considered in my medical decision making (see chart for details).     Patient presenting for evaluation of altered mental status.  Initial exam shows patient with confusion, unsure what his dementia and what is new.  Will obtain labs, urine, chest x-ray, and CT head for further evaluation.  Labs reassuring, mild leukocytosis 11.3, but no obvious source of infection.  UA negative for UTI.  Chest x-ray read interpreted by me, no pneumonia, pneumothorax, effusion, or cardiomegaly.  CT head pending.  Case discussed with attending, Dr. Estell Harpin evaluated the patient.  CT head shows concerning signs for left parietal acute or subacute stroke.  Will call for admission.  Discussed with Dr. Roda Shutters from [facility], who requests consult to neurology for further plans on management and where patient should be admitted.  Discussed with Dr. Amada Jupiter, who recommends transfer to [facility] and admission to the hospitalist.  Pt's daughter, Seward Grater informed of results and plan for pt to be transferred. Requests she be kept informed of pt's progress/status.   Final Clinical Impressions(s) / ED Diagnoses   Final diagnoses:  Cerebrovascular accident (CVA), unspecified mechanism [facility])    ED Discharge Orders    None       Alveria Apley, PA-C 04/01/18 1804    Alveria Apley, PA-C 04/01/18 Darlys Gales, MD 04/02/18 [phone]

## 2018-04-01 NOTE — ED Notes (Signed)
Pts daughter Wandra Mannan([name] Swonkie): [phone]

## 2018-04-01 NOTE — ED Notes (Signed)
[facility] called for transport. 

## 2018-04-01 NOTE — H&P (Signed)
History and Physical  KARINNE SCHMADER ZOX:[id number] DOB: 11-29-1926 DOA: 04/01/2018  Referring physician: EDP PCP: Merlene Laughter, MD   Chief Complaint: Altered, acting not normal  HPI: Natalie Peterson is a 84 y.o. female history of hypertension, non-insulin-dependent type 2 diabetes, dementia From a facility [facility]) due to altered mental status.  She was last seen normal last night before go to bed, this morning facility staff noticed that she was altered, saying yes to everything. EMS called, EMS reports that she has been "pleasantly confused". Pt was ambulatory to stretcher.  She is brought to [facility] long ED.  ED course: Vital signs are stable, basic lab work stable, with mild elevated WBC at 11.3, blood glucose 139, BUN 24, creatinine 1.07. UA unremarkable, chest x-ray no acute findings, ct head "Low-density in the left parietal lobe compatible with acute to subacute infarction. No hemorrhage."  EKG sinus rhythm, left bundle branch block, no prior EKG in the system EDP called neurology Dr. Amada Jupiter who recommended admit to hospitalist service , transfer patient  to [facility] for further stroke  work-up.   Review of Systems:  Detail per HPI, Review of systems are otherwise negative  Past Medical History:  Diagnosis Date  . Dementia (HCC)   . Diabetes mellitus without complication (HCC)   . Hypertension    History reviewed. No pertinent surgical history. Social History:  reports that she has never smoked. She has never used smokeless tobacco. She reports that she does not drink alcohol or use drugs. Patient lives at a facility & is able to participate in activities of daily with assistance  No Known Allergies  History reviewed. No pertinent family history.    Prior to Admission medications   Medication Sig Start Date End Date Taking? Authorizing Provider  acetaminophen (TYLENOL) 500 MG tablet Take 500 mg by mouth every 4 (four) hours as needed for mild pain.    Yes [provider]  ammonium lactate (LAC-HYDRIN) 12 % lotion Apply at bedtime to legs, feet but not to toes. 12/29/17  Yes Helane Gunther, DPM  fexofenadine (ALLEGRA) 180 MG tablet Take 180 mg by mouth daily.   Yes [provider]  fluocinonide (LIDEX) 0.05 % external solution Apply 1 application topically 2 (two) times daily as needed (scalp).   Yes [provider]  gabapentin (NEURONTIN) 100 MG capsule Take 100 mg by mouth at bedtime.   Yes [provider]  glimepiride (AMARYL) 4 MG tablet Take 4 mg by mouth daily with breakfast.  03/03/18  Yes [provider]  JANUVIA 100 MG tablet Take 100 mg by mouth daily.  03/03/18  Yes [provider]  lisinopril (PRINIVIL,ZESTRIL) 5 MG tablet Take 5 mg by mouth daily.  11/25/17  Yes [provider]  Multiple Vitamins-Minerals (CEROVITE SENIOR) TABS Take 1 tablet by mouth daily.   Yes [provider]  Polyethyl Glycol-Propyl Glycol (SYSTANE ULTRA) 0.4-0.3 % SOLN Apply 1 drop to eye 4 (four) times daily as needed (dry eyes/eye irritation).   Yes [provider]  polyethylene glycol (MIRALAX / GLYCOLAX) packet Take 17 g by mouth daily.   Yes [provider]    Physical Exam: BP 134/80   Pulse 76   Temp 97.8 F (36.6 C) (Oral)   Resp 16   SpO2 98%   General: Pleasantly confused, follow commands, but seems to have word finding trouble Eyes: PERRL ENT: unremarkable Neck: supple, no JVD Cardiovascular: RRR Respiratory: CTABL Abdomen: soft/ND/ND, positive bowel sounds  Skin: no rash Musculoskeletal:  No edema Psychiatric: calm/cooperative Neurologic: no focal findings  ,Pleasantly confused, follow commands, but seems to have word finding trouble, moving all extremities           Labs on Admission:  Basic Metabolic Panel: Recent Labs  Lab 04/01/18 1225  NA 137  K 4.6  CL 102  CO2 30  GLUCOSE 139*  BUN 24*  CREATININE 1.07*  CALCIUM 10.0   Liver  Function Tests: Recent Labs  Lab 04/01/18 1225  AST 22  ALT 18  ALKPHOS 43  BILITOT 0.8  PROT 6.7  ALBUMIN 3.4*   No results for input(s): LIPASE, AMYLASE in the last 168 hours. No results for input(s): AMMONIA in the last 168 hours. CBC: Recent Labs  Lab 04/01/18 1225  WBC 11.3*  HGB 15.0  HCT 45.5  MCV 93.0  PLT 234   Cardiac Enzymes: No results for input(s): CKTOTAL, CKMB, CKMBINDEX, TROPONINI in the last 168 hours.  BNP (last 3 results) No results for input(s): BNP in the last 8760 hours.  ProBNP (last 3 results) No results for input(s): PROBNP in the last 8760 hours.  CBG: Recent Labs  Lab 04/01/18 1215  GLUCAP 133*    Radiological Exams on Admission: Dg Chest 2 View  Result Date: 04/01/2018 CLINICAL DATA:  Altered mental status EXAM: CHEST - 2 VIEW COMPARISON:  None. FINDINGS: Normal heart size. Lungs are under aerated and grossly clear. No pneumothorax. No pleural effusion. IMPRESSION: No active cardiopulmonary disease. Electronically Signed   By: Jolaine ClickArthur  Hoss M.D.   On: 04/01/2018 13:35   Ct Head Wo Contrast  Result Date: 04/01/2018 CLINICAL DATA:  Altered level of consciousness EXAM: CT HEAD WITHOUT CONTRAST TECHNIQUE: Contiguous axial images were obtained from the base of the skull through the vertex without intravenous contrast. COMPARISON:  None. FINDINGS: Brain: There is an area of low-density in the left parietal lobe compatible with acute to subacute infarct. No hemorrhage. No hydrocephalus or midline shift. Vascular: No hyperdense vessel or unexpected calcification. Skull: No acute calvarial abnormality. Sinuses/Orbits: Visualized paranasal sinuses and mastoids clear. Orbital soft tissues unremarkable. Other: None IMPRESSION: Low-density in the left parietal lobe compatible with acute to subacute infarction. No hemorrhage. Electronically Signed   By: Charlett NoseKevin  Dover M.D.   On: 04/01/2018 16:03    EKG: Independently reviewed.  EKG sinus rhythm, left  bundle branch block, no prior EKG in the system   Assessment/Plan Present on Admission: . CVA (cerebral vascular accident) (HCC)  Acute CVA: Ct head: Low-density in the left parietal lobe compatible with acute to subacute infarction. No hemorrhage. Check lipid panel, A1c Echocardiogram/tele monitor PT speech I do not see aspirin or statin on her home med list, will start aspirin /lipitor for now ( give asa rectally x1 for now due to npo , needs swallow eval) Follow neurology recommendation  Hypertension -Allow permissive hypertension hold lisinopril for now  Non-insulin dependent type 2 diabetes -Home med Januvia held for now -Start ssi here   Dementia At risk of delirium, will order safety sitter Per daughter patient has been having confusion since June , patient is diagnosed with early dementia in September 2019 by her primary care doctor.   DVT prophylaxis: lovenox  Consultants: neurology  Code Status: full   Family Communication:  Patient   Pts daughter Wandra Mannan([name] Swonkie): [phone] over the phone  Disposition Plan: admit to med tele at [place]  Time spent: 60mins  Albertine GratesFang Alainna Stawicki MD, PhD [facility] Pager [phone]  [phone] If 7PM-7AM, please contact night-coverage at [email or web address], password [facility]

## 2018-04-02 ENCOUNTER — Inpatient Hospital Stay (HOSPITAL_COMMUNITY): Payer: Medicare Other

## 2018-04-02 DIAGNOSIS — I63312 Cerebral infarction due to thrombosis of left middle cerebral artery: Secondary | ICD-10-CM

## 2018-04-02 DIAGNOSIS — R001 Bradycardia, unspecified: Secondary | ICD-10-CM

## 2018-04-02 LAB — GLUCOSE, CAPILLARY
Glucose-Capillary: 102 mg/dL — ABNORMAL HIGH (ref 70–99)
Glucose-Capillary: 105 mg/dL — ABNORMAL HIGH (ref 70–99)
Glucose-Capillary: 123 mg/dL — ABNORMAL HIGH (ref 70–99)
Glucose-Capillary: 105 mg/dL — ABNORMAL HIGH (ref 70–99)

## 2018-04-02 LAB — CBC
HCT: 43.8 % (ref 36.0–46.0)
Hemoglobin: 14.7 g/dL (ref 12.0–15.0)
MCH: 29.9 pg (ref 26.0–34.0)
MCHC: 33.6 g/dL (ref 30.0–36.0)
MCV: 89.2 fL (ref 80.0–100.0)
Platelets: 244 10*3/uL (ref 150–400)
RBC: 4.91 MIL/uL (ref 3.87–5.11)
RDW: 12.2 % (ref 11.5–15.5)
WBC: 13 10*3/uL — ABNORMAL HIGH (ref 4.0–10.5)
nRBC: 0 % (ref 0.0–0.2)

## 2018-04-02 LAB — URINE CULTURE: Culture: NO GROWTH

## 2018-04-02 LAB — MRSA PCR SCREENING: MRSA by PCR: NEGATIVE

## 2018-04-02 NOTE — Progress Notes (Signed)
VASCULAR LAB PRELIMINARY  PRELIMINARY  PRELIMINARY  PRELIMINARY  Carotid duplex completed.    Preliminary report:  1-39% ICA plaquing. Vertebral artery flow is antegrade.  Saraiah Bhat, RVT 04/02/2018, 9:38 AM

## 2018-04-02 NOTE — Progress Notes (Signed)
PROGRESS NOTE  Natalie Peterson  WUJ:[id number] DOB: [birth year] DOA: 04/01/2018 PCP: Merlene Laughter, MD  Brief Narrative: Natalie Peterson is an 84 y.o. female with a history of NIDT2DM, HTN, and dementia who presented from ALF at [facility] due to AMS found to have left parietal CVA on CT with minimal petechial hemorrhage, suspected embolic. She was transferred to [facility] for continued stroke evaluation by neurology.   Assessment & Plan: Active Problems:   CVA (cerebral vascular accident) (HCC)  Left posterior MCA CVA: With resultant right hemianopsia and aphasia.  - Stroke neurology following, further evaluation planned, though TEE and ILR not planned due to baseline dementia.  - Continue telemetry, check TTE - Started ASA, statin - Carotids without significant stenosis  Dementia:  - Agree with safety sitter due to risk of delirium, continue delirium precautions.  - Discussed with daughter by phone, who will arrive from [place] in the nest few days to assist with smooth transition to SNF side of [facility]. Familiarity will be very helpful in that setting.   HTN: Permissive HTN  NIDT2DM:  - Hold januvia, give SSI for tight glucose control.  - HbA1c   Leukocytosis: Afebrile, no pyuria or CXR infiltrate or cutaneous infection noted.  - Monitor off abx.   DVT prophylaxis: Lovenox Code Status: Full, will continue discussions with family as they arrive. Family Communication: Daughter by phone today Disposition Plan: SNF  Consultants:   Neurology  Procedures:   Echo pending  Antimicrobials:  None   Subjective: Denies pain, doesn't like the food here. Otherwise limited by dementia. No overnight events mentioned by staff.   Objective: Vitals:   04/02/18 0400 04/02/18 0745 04/02/18 1142 04/02/18 1555  BP: (!) 124/43 127/64 (!) 135/51 (!) 140/59  Pulse: 82 78 82 73  Resp: 18 17 16 18   Temp: 97.8 F (36.6 C) 98.3 F (36.8 C) 98.7 F (37.1 C) 98.7 F (37.1 C)    TempSrc: Oral Oral Oral Oral  SpO2: 97%  97% 97%  Weight:        Intake/Output Summary (Last 24 hours) at 04/02/2018 1725 Last data filed at 04/02/2018 0816 Gross per 24 hour  Intake 348.24 ml  Output 250 ml  Net 98.24 ml   Filed Weights   04/01/18 1938  Weight: 66.3 kg    Gen: 84 y.o. female in no distress  Pulm: Non-labored breathing . Clear to auscultation bilaterally.  CV: Regular rate and rhythm. No murmur, rub, or gallop. No JVD, no pedal edema. GI: Abdomen soft, non-tender, non-distended, with normoactive bowel sounds. No organomegaly or masses felt. Ext: Warm, no deformities Skin: No rashes, lesions or ulcers Neuro: Alert aphasic, not oriented, perseverative. Moves all extremities. Psych: Judgement and insight appear impaired.  Data Reviewed: I have personally reviewed following labs and imaging studies  CBC: Recent Labs  Lab 04/01/18 1225 04/02/18 0005  WBC 11.3* 13.0*  HGB 15.0 14.7  HCT 45.5 43.8  MCV 93.0 89.2  PLT 234 244   Basic Metabolic Panel: Recent Labs  Lab 04/01/18 1225 04/01/18 2126  NA 137  --   K 4.6  --   CL 102  --   CO2 30  --   GLUCOSE 139*  --   BUN 24*  --   CREATININE 1.07* 0.94  CALCIUM 10.0  --    GFR: CrCl cannot be calculated (Unknown ideal weight.). Liver Function Tests: Recent Labs  Lab 04/01/18 1225  AST 22  ALT 18  ALKPHOS 43  BILITOT 0.8  PROT 6.7  ALBUMIN 3.4*   No results for input(s): LIPASE, AMYLASE in the last 168 hours. No results for input(s): AMMONIA in the last 168 hours. Coagulation Profile: No results for input(s): INR, PROTIME in the last 168 hours. Cardiac Enzymes: No results for input(s): CKTOTAL, CKMB, CKMBINDEX, TROPONINI in the last 168 hours. BNP (last 3 results) No results for input(s): PROBNP in the last 8760 hours. HbA1C: No results for input(s): HGBA1C in the last 72 hours. CBG: Recent Labs  Lab 04/01/18 1957 04/01/18 2318 04/02/18 0631 04/02/18 1139 04/02/18 1552  GLUCAP  109* 111* 102* 105* 123*   Lipid Profile: No results for input(s): CHOL, HDL, LDLCALC, TRIG, CHOLHDL, LDLDIRECT in the last 72 hours. Thyroid Function Tests: No results for input(s): TSH, T4TOTAL, FREET4, T3FREE, THYROIDAB in the last 72 hours. Anemia Panel: No results for input(s): VITAMINB12, FOLATE, FERRITIN, TIBC, IRON, RETICCTPCT in the last 72 hours. Urine analysis:    Component Value Date/Time   COLORURINE YELLOW 04/01/2018 1419   APPEARANCEUR CLEAR 04/01/2018 1419   LABSPEC 1.009 04/01/2018 1419   PHURINE 6.0 04/01/2018 1419   GLUCOSEU NEGATIVE 04/01/2018 1419   HGBUR NEGATIVE 04/01/2018 1419   BILIRUBINUR NEGATIVE 04/01/2018 1419   KETONESUR NEGATIVE 04/01/2018 1419   PROTEINUR NEGATIVE 04/01/2018 1419   NITRITE NEGATIVE 04/01/2018 1419   LEUKOCYTESUR NEGATIVE 04/01/2018 1419   Recent Results (from the past 240 hour(s))  Urine culture     Status: None   Collection Time: 04/01/18  2:19 PM  Result Value Ref Range Status   Specimen Description   Final    URINE, CLEAN CATCH Performed at [facility], [address] [place], [place] [address]    Special Requests   Final    NONE Performed at [facility], [address], [place], [place] [address]    Culture   Final    NO GROWTH Performed at [facility], [address], [place], [place] [address]    Report Status 04/02/2018 FINAL  Final  MRSA PCR Screening     Status: None   Collection Time: 04/01/18 11:49 PM  Result Value Ref Range Status   MRSA by PCR NEGATIVE NEGATIVE Final    Comment:        The GeneXpert MRSA Assay (FDA approved for NASAL specimens only), is one component of a comprehensive MRSA colonization surveillance program. It is not intended to diagnose MRSA infection nor to guide or monitor treatment for MRSA infections. Performed at [facility], [address], [place], [place] [address]       Radiology Studies: Dg Chest 2 View  Result  Date: 04/01/2018 CLINICAL DATA:  Altered mental status EXAM: CHEST - 2 VIEW COMPARISON:  None. FINDINGS: Normal heart size. Lungs are under aerated and grossly clear. No pneumothorax. No pleural effusion. IMPRESSION: No active cardiopulmonary disease. Electronically Signed   By: Jolaine Click M.D.   On: 04/01/2018 13:35   Ct Head Wo Contrast  Result Date: 04/01/2018 CLINICAL DATA:  Altered level of consciousness EXAM: CT HEAD WITHOUT CONTRAST TECHNIQUE: Contiguous axial images were obtained from the base of the skull through the vertex without intravenous contrast. COMPARISON:  None. FINDINGS: Brain: There is an area of low-density in the left parietal lobe compatible with acute to subacute infarct. No hemorrhage. No hydrocephalus or midline shift. Vascular: No hyperdense vessel or unexpected calcification. Skull: No acute calvarial abnormality. Sinuses/Orbits: Visualized paranasal sinuses and mastoids clear. Orbital soft tissues  unremarkable. Other: None IMPRESSION: Low-density in the left parietal lobe compatible with acute to subacute infarction. No hemorrhage. Electronically Signed   By: Charlett Nose M.D.   On: 04/01/2018 16:03   Mr Brain Wo Contrast  Result Date: 04/01/2018 CLINICAL DATA:  Altered mental status EXAM: MRI HEAD WITHOUT CONTRAST TECHNIQUE: Multiplanar, multiecho pulse sequences of the brain and surrounding structures were obtained without intravenous contrast. COMPARISON:  None. FINDINGS: BRAIN: There is abnormal diffusion restriction within the posterior left MCA territory, within the left parietal and temporal lobes. The midline structures are normal. No midline shift or other mass effect. There is hyperintense T2-weighted signal that corresponds to the diffusion abnormality. Multifocal white matter hyperintensity, most commonly due to chronic ischemic microangiopathy. The cerebral and cerebellar volume are age-appropriate. There is a small amount of petechial hemorrhage at the infarct  site. VASCULAR: Major intracranial arterial and venous sinus flow voids are normal. SKULL AND UPPER CERVICAL SPINE: Calvarial bone marrow signal is normal. There is no skull base mass. Visualized upper cervical spine and soft tissues are normal. SINUSES/ORBITS: No fluid levels or advanced mucosal thickening. No mastoid or middle ear effusion. The orbits are normal. IMPRESSION: Posterior left MCA territory infarct with minimal petechial hemorrhage, Heidelberg classification 1a: HI1, scattered small petechiae, no mass effect. Electronically Signed   By: Deatra Robinson M.D.   On: 04/01/2018 22:45   Vas US Carotid (at [facility])  Result Date: 04/02/2018 Carotid Arterial Duplex Study Indications:  CVA and Altered mental status. Risk Factors: Hypertension, Diabetes. Performing Technologist: Sherren Kerns RVS  Examination Guidelines: A complete evaluation includes B-mode imaging, spectral Doppler, color Doppler, and power Doppler as needed of all accessible portions of each vessel. Bilateral testing is considered an integral part of a complete examination. Limited examinations for reoccurring indications may be performed as noted.  Right Carotid Findings: +----------+--------+--------+--------+------------+------------------+           PSV cm/sEDV cm/sStenosisDescribe    Comments           +----------+--------+--------+--------+------------+------------------+ CCA Prox  99      15                          intimal thickening +----------+--------+--------+--------+------------+------------------+ CCA Distal98      17                          intimal thickening +----------+--------+--------+--------+------------+------------------+ ICA Prox  60      18              heterogenous                   +----------+--------+--------+--------+------------+------------------+ ICA Distal83      20                                              +----------+--------+--------+--------+------------+------------------+ ECA       111     3                                              +----------+--------+--------+--------+------------+------------------+ +----------+--------+-------+--------+-------------------+           PSV cm/sEDV cmsDescribeArm Pressure (mmHG) +----------+--------+-------+--------+-------------------+ [id number]                                         +----------+--------+-------+--------+-------------------+ +---------+--------+--+--------+-+  VertebralPSV cm/s54EDV cm/s9 +---------+--------+--+--------+-+  Left Carotid Findings: +----------+--------+-------+--------+------------+----------------------------+           PSV cm/sEDV    StenosisDescribe    Comments                                       cm/s                                                    +----------+--------+-------+--------+------------+----------------------------+ CCA Prox  84      11                         tortuos                      +----------+--------+-------+--------+------------+----------------------------+ CCA Distal114     17                         intimal thickening                                                        tortuosity                   +----------+--------+-------+--------+------------+----------------------------+ ICA Prox  115     22             heterogenoustortuous                     +----------+--------+-------+--------+------------+----------------------------+ ICA Distal56      14                         tortuous                     +----------+--------+-------+--------+------------+----------------------------+ ECA       125     9                                                       +----------+--------+-------+--------+------------+----------------------------+ +----------+--------+--------+--------+-------------------+ SubclavianPSV cm/sEDV  cm/sDescribeArm Pressure (mmHG) +----------+--------+--------+--------+-------------------+           84                                          +----------+--------+--------+--------+-------------------+ +---------+--------+--+--------+--+ VertebralPSV cm/s55EDV cm/s13 +---------+--------+--+--------+--+  Summary: Right Carotid: The extracranial vessels were near-normal with only minimal wall                thickening or plaque. Left Carotid: The extracranial vessels were near-normal with only minimal wall               thickening or plaque. Vertebrals:  Bilateral vertebral arteries demonstrate antegrade flow. Subclavians: Normal flow hemodynamics were seen in bilateral subclavian  arteries. *See table(s) above for measurements and observations.  Electronically signed by Delia Heady MD on 04/02/2018 at 1:05:22 PM.    Final     Scheduled Meds: . enoxaparin (LOVENOX) injection  40 mg Subcutaneous Q24H  . insulin aspart  0-9 Units Subcutaneous TID WC  . senna-docusate  1 tablet Oral QHS   Continuous Infusions: . sodium chloride 75 mL/hr at 04/02/18 0054     LOS: 1 day   Time spent: 25 minutes.  Tyrone Nine, MD [facility] [email or web address] Password [email or web address] 04/02/2018, 5:25 PM

## 2018-04-02 NOTE — Evaluation (Signed)
Physical Therapy Evaluation Patient Details Name: Natalie PenningMargaret C Sedlacek MRN: [id number] DOB: 01/26/1927 Today's Date: 04/02/2018   History of Present Illness   84 y.o. female with past medical history significant for diabetes, hypertension, dementia was [facility] as patient appeared more confused today and unable to carry on a conversation. Last seen normal was yesterday night.  CT head suggestive for left parietal stroke.  An MRI brain was performed confirmed left MCA acute stroke.   Clinical Impression  Orders received for PT evaluation. Patient demonstrates deficits in functional mobility as indicated below. Will benefit from continued skilled PT to address deficits and maximize function. Will see as indicated and progress as tolerated.  At this time, patient requires assist for safety and mobility. Given functional deficits feel patient would benefit from return to SNF with therapy services.    Follow Up Recommendations SNF;Supervision/Assistance - 24 hour    Equipment Recommendations  None recommended by PT    Recommendations for Other Services       Precautions / Restrictions Precautions Precautions: Fall      Mobility  Bed Mobility Overal bed mobility: Needs Assistance Bed Mobility: Supine to Sit;Rolling Rolling: Min guard   Supine to sit: Min assist     General bed mobility comments: min assist to rotate hips to EOB with increased time to perform  Transfers Overall transfer level: Needs assistance Equipment used: Rolling walker (2 wheeled) Transfers: Sit to/from Stand Sit to Stand: Min guard         General transfer comment: min guard for safety and stability  Ambulation/Gait Ambulation/Gait assistance: Min assist Gait Distance (Feet): 30 Feet Assistive device: 1 person hand held assist Gait Pattern/deviations: Step-through pattern;Decreased stride length;Drifts right/left Gait velocity: decreased   General  Gait Details: patient with some instability during ambulation reliance on UE support and furniture surfing   Stairs            Wheelchair Mobility    Modified Rankin (Stroke Patients Only) Modified Rankin (Stroke Patients Only) Pre-Morbid Rankin Score: No symptoms Modified Rankin: Moderate disability     Balance Overall balance assessment: Needs assistance Sitting-balance support: Feet supported Sitting balance-Leahy Scale: Good     Standing balance support: During functional activity Standing balance-Leahy Scale: Fair Standing balance comment: relaince on UE support with challenge tasks                             Pertinent Vitals/Pain Pain Assessment: No/denies pain    Home Living Family/patient expects to be discharged to:: Skilled nursing facility                      Prior Function Level of Independence: Needs assistance   Gait / Transfers Assistance Needed: superivsion for mobility           Hand Dominance   Dominant Hand: Right    Extremity/Trunk Assessment   Upper Extremity Assessment Upper Extremity Assessment: Generalized weakness    Lower Extremity Assessment Lower Extremity Assessment: Generalized weakness       Communication   Communication: HOH  Cognition Arousal/Alertness: Awake/alert Behavior During Therapy: WFL for tasks assessed/performed Overall Cognitive Status: History of cognitive impairments - at baseline  General Comments      Exercises     Assessment/Plan    PT Assessment Patient needs continued PT services  PT Problem List Decreased activity tolerance;Decreased balance;Decreased mobility;Decreased strength;Decreased cognition       PT Treatment Interventions Gait training;DME instruction;Functional mobility training;Therapeutic activities;Therapeutic exercise;Balance training;Neuromuscular re-education;Cognitive remediation    PT Goals  (Current goals can be found in the Care Plan section)  Acute Rehab PT Goals Patient Stated Goal: to go home PT Goal Formulation: With patient Time For Goal Achievement: 04/16/18 Potential to Achieve Goals: Good    Frequency Min 3X/week   Barriers to discharge        Co-evaluation               AM-PAC PT "6 Clicks" Mobility  Outcome Measure Help needed turning from your back to your side while in a flat bed without using bedrails?: A Little Help needed moving from lying on your back to sitting on the side of a flat bed without using bedrails?: A Little Help needed moving to and from a bed to a chair (including a wheelchair)?: A Little Help needed standing up from a chair using your arms (e.g., wheelchair or bedside chair)?: A Little Help needed to walk in hospital room?: A Lot Help needed climbing 3-5 steps with a railing? : Total 6 Click Score: 15    End of Session Equipment Utilized During Treatment: Gait belt Activity Tolerance: Patient tolerated treatment well Patient left: in chair;with call bell/phone within reach;with chair alarm set Nurse Communication: Mobility status PT Visit Diagnosis: Difficulty in walking, not elsewhere classified (R26.2);Other symptoms and signs involving the nervous system (R29.898)    Time: 8413-2440 PT Time Calculation (min) (ACUTE ONLY): 17 min   Charges:   PT Evaluation $PT Eval Moderate Complexity: 1 Mod          Charlotte Crumb, PT DPT  Board Certified Neurologic Specialist Acute Rehabilitation Services Pager [phone] Office [phone]   Fabio Asa 04/02/2018, 1:33 PM

## 2018-04-02 NOTE — Progress Notes (Signed)
STROKE TEAM PROGRESS NOTE   HISTORY OF PRESENT ILLNESS (per record) Natalie Peterson is an 84 y.o. female with past medical history significant for diabetes, hypertension, dementia was transferred from living facility to [facility] emergency department as patient appeared more confused today and unable to carry on a conversation. Last seen normal was yesterday night.  CT head suggestive for left parietal stroke.  An MRI brain was performed confirmed left MCA acute stroke.   History limited as patient is aphasic and is obtained by chart review.  Date last known well: 11.28.19  tPA Given: No, outside window NIHSS: 4 Baseline MRS 3   SUBJECTIVE (INTERVAL HISTORY) Sitter present. Pt alert but very aphasic - perseverates. No focal weakness noted. Dr Pearlean Brownie spoke with the patient's daughter in [place] by phone.  She is planning to fly to [place].    OBJECTIVE Vitals:   04/02/18 0049 04/02/18 0400 04/02/18 0745 04/02/18 1142  BP: (!) 132/42 (!) 124/43 127/64 (!) 135/51  Pulse: 77 82 78 82  Resp: 18 18 17 16   Temp: 98.1 F (36.7 C) 97.8 F (36.6 C) 98.3 F (36.8 C) 98.7 F (37.1 C)  TempSrc: Oral Oral Oral Oral  SpO2: 96% 97%  97%  Weight:        CBC:  Recent Labs  Lab 04/01/18 1225 04/02/18 0005  WBC 11.3* 13.0*  HGB 15.0 14.7  HCT 45.5 43.8  MCV 93.0 89.2  PLT 234 244    Basic Metabolic Panel:  Recent Labs  Lab 04/01/18 1225 04/01/18 2126  NA 137  --   K 4.6  --   CL 102  --   CO2 30  --   GLUCOSE 139*  --   BUN 24*  --   CREATININE 1.07* 0.94  CALCIUM 10.0  --     Lipid Panel: No results found for: CHOL, TRIG, HDL, CHOLHDL, VLDL, LDLCALC HgbA1c: No results found for: HGBA1C Urine Drug Screen: No results found for: LABOPIA, COCAINSCRNUR, LABBENZ, AMPHETMU, THCU, LABBARB  Alcohol Level No results found for: [facility]  IMAGING   Dg Chest 2 View 04/01/2018 IMPRESSION:  No active cardiopulmonary disease.     Ct Head Wo Contrast 04/01/2018 IMPRESSION:   Low-density in the left parietal lobe compatible with acute to subacute infarction. No hemorrhage.   Mr Brain Wo Contrast 04/01/2018 IMPRESSION:  Posterior left MCA territory infarct with minimal petechial hemorrhage, Heidelberg classification 1a: HI1, scattered small petechiae, no mass effect.    Transthoracic Echocardiogram - pending 00/00/00    Bilateral Carotid Dopplers 04/02/2018 Summary: Right Carotid: The extracranial vessels were near-normal with only minimal wall        thickening or plaque. Left Carotid: The extracranial vessels were near-normal with only minimal wall       thickening or plaque. Vertebrals: Bilateral vertebral arteries demonstrate antegrade flow. Subclavians: Normal flow hemodynamics were seen in bilateral subclavian       arteries.    PHYSICAL EXAM Blood pressure (!) 135/51, pulse 82, temperature 98.7 F (37.1 C), temperature source Oral, resp. rate 16, weight 66.3 kg, SpO2 97 %. Pleasant elderly Caucasian lady not in distress. . Afebrile. Head is nontraumatic. Neck is supple without bruit.    Cardiac exam no murmur or gallop. Lungs are clear to auscultation. Distal pulses are well felt. Neurological Exam :   Awake alert disoriented to time place and person.  Diminished attention, registration and recall.  Poor insight into her illness.  Extraocular movements are full range without nystagmus.  Blinks  to threat on the left but not on the right.  Pupils irregular equal reactive.  Fundi not visualized.  Face is symmetric without weakness.  Tongue midline.  Motor system exam reveals symmetric upper and lower extremity strength without focal weakness.  Tone and bulk appear normal reflexes are symmetric.  Plantars are downgoing.  Sensation appears intact.  Gait not tested.   HOME MEDICATIONS:  Medications Prior to Admission  Medication Sig Dispense Refill  . acetaminophen (TYLENOL) 500 MG tablet Take 500 mg by mouth every 4 (four)  hours as needed for mild pain.    Marland Kitchen. ammonium lactate (LAC-HYDRIN) 12 % lotion Apply at bedtime to legs, feet but not to toes. 400 g 1  . fexofenadine (ALLEGRA) 180 MG tablet Take 180 mg by mouth daily.    . fluocinonide (LIDEX) 0.05 % external solution Apply 1 application topically 2 (two) times daily as needed (scalp).    . gabapentin (NEURONTIN) 100 MG capsule Take 100 mg by mouth at bedtime.    Marland Kitchen. glimepiride (AMARYL) 4 MG tablet Take 4 mg by mouth daily with breakfast.   10  . JANUVIA 100 MG tablet Take 100 mg by mouth daily.   10  . lisinopril (PRINIVIL,ZESTRIL) 5 MG tablet Take 5 mg by mouth daily.   3  . Multiple Vitamins-Minerals (CEROVITE SENIOR) TABS Take 1 tablet by mouth daily.    Bertram Gala. Polyethyl Glycol-Propyl Glycol (SYSTANE ULTRA) 0.4-0.3 % SOLN Apply 1 drop to eye 4 (four) times daily as needed (dry eyes/eye irritation).    . polyethylene glycol (MIRALAX / GLYCOLAX) packet Take 17 g by mouth daily.        HOSPITAL MEDICATIONS:  . enoxaparin (LOVENOX) injection  40 mg Subcutaneous Q24H  . insulin aspart  0-9 Units Subcutaneous TID WC  . senna-docusate  1 tablet Oral QHS    ALLERGIES No Known Allergies  PAST MEDICAL HISTORY Past Medical History:  Diagnosis Date  . Dementia (HCC)   . Diabetes mellitus without complication (HCC)   . Hypertension     SURGICAL HISTORY History reviewed. No pertinent surgical history.  FAMILY HISTORY History reviewed. No pertinent family history.  SOCIAL HISTORY  reports that she has never smoked. She has never used smokeless tobacco. She reports that she does not drink alcohol or use drugs.  ASSESSMENT/PLAN Natalie Peterson is a 84 y.o. female with history ofdiabetes, hypertension, dementia   presenting with increased confusion. She did not receive IV t-PA due to Late presentation.  Stroke: left MCA territory infarct - embolic - unknown source  Resultant aphasia with right hemianopsia superimposed on baseline dementia  CT head  - Low-density in the left parietal lobe compatible with acute to subacute infarction.  MRI head - Posterior left MCA territory infarct with minimal petechial hemorrhage  MRA head - pending  Carotid Doppler - unremarkable  2D Echo - pending  LDL - pending  HgbA1c - pending  UDS - not performed  VTE prophylaxis - Lovenox  Diet - Heart healthy / carb modified with thin liquids.  No antithrombotic prior to admission, now on aspirin 300 mg suppository daily  Ongoing aggressive stroke risk factor management  Therapy recommendations:  SNF  Disposition:  Pending  Hypertension  Stable . Permissive hypertension (OK if < 220/120) but gradually normalize in 5-7 days . Long-term BP goal normotensive  Hyperlipidemia  Lipid lowering medication PTA:  none  LDL pending, goal < 70  Current lipid lowering medication: none  Continue statin at discharge  Diabetes  HgbA1c pending, goal < 7.0  Unc / Controlled  Other Stroke Risk Factors  Advanced age   Other Active Problems  Dementia  Leukocytosis 11.0 -> 13.0 afebrile   Hospital day # 1  Delton See PA-C [facility] Pager [phone] 04/02/2018, 3:34 PM I have personally examined this patient, reviewed notes, independently viewed imaging studies, participated in medical decision making and plan of care.ROS completed by me personally and pertinent positives fully documented  I have made any additions or clarifications directly to the above note. Agree with note above.  She has baseline dementia and presented with increased confusion and MRI scan shows embolic left MCA infarct etiology to be determined.  Recommend continue ongoing stroke work-up.  Given her baseline dementia will not pursue aggressive work-up like TEE or loop recorder.  I had a long discussion with the patient's daughter over the phone and she will likely need rehab with 24-hour care post discharge.  Continue aspirin for now.  Greater  than 50% time during this 35-minute visit was spent on counseling and coordination of care about her dementia, embolic stroke and answering questions about evaluation and treatment.  Delia Heady, MD Medical Director [facility] Pager: [phone] 04/02/2018 4:10 PM   To contact Stroke Continuity provider, please refer to [email or web address]. After hours, contact General Neurology

## 2018-04-03 ENCOUNTER — Inpatient Hospital Stay (HOSPITAL_COMMUNITY): Payer: Medicare Other

## 2018-04-03 DIAGNOSIS — I6300 Cerebral infarction due to thrombosis of unspecified precerebral artery: Secondary | ICD-10-CM

## 2018-04-03 DIAGNOSIS — I340 Nonrheumatic mitral (valve) insufficiency: Secondary | ICD-10-CM

## 2018-04-03 DIAGNOSIS — I350 Nonrheumatic aortic (valve) stenosis: Secondary | ICD-10-CM

## 2018-04-03 LAB — BASIC METABOLIC PANEL
Anion gap: 10 (ref 5–15)
BUN: 15 mg/dL (ref 8–23)
CO2: 22 mmol/L (ref 22–32)
Calcium: 8.7 mg/dL — ABNORMAL LOW (ref 8.9–10.3)
Chloride: 105 mmol/L (ref 98–111)
Creatinine, Ser: 0.85 mg/dL (ref 0.44–1.00)
GFR calc Af Amer: >60 mL/min (ref >60)
GFR calc non Af Amer: >60 mL/min (ref >60)
Glucose, Bld: 123 mg/dL — ABNORMAL HIGH (ref 70–99)
Potassium: 3.8 mmol/L (ref 3.5–5.1)
Sodium: 137 mmol/L (ref 135–145)

## 2018-04-03 LAB — CBC WITH DIFFERENTIAL/PLATELET
Abs Immature Granulocytes: 0.04 10*3/uL (ref 0.00–0.07)
Basophils Absolute: 0.1 10*3/uL (ref 0.0–0.1)
Basophils Relative: 1 %
Eosinophils Absolute: 0.3 10*3/uL (ref 0.0–0.5)
Eosinophils Relative: 3 %
HCT: 41 % (ref 36.0–46.0)
HEMOGLOBIN: 13.7 g/dL (ref 12.0–15.0)
Immature Granulocytes: 0 %
LYMPHS PCT: 30 %
Lymphs Abs: 2.7 10*3/uL (ref 0.7–4.0)
MCH: 30 pg (ref 26.0–34.0)
MCHC: 33.4 g/dL (ref 30.0–36.0)
MCV: 89.7 fL (ref 80.0–100.0)
Monocytes Absolute: 0.8 10*3/uL (ref 0.1–1.0)
Monocytes Relative: 9 %
Neutro Abs: 5.2 10*3/uL (ref 1.7–7.7)
Neutrophils Relative %: 57 %
Platelets: 228 10*3/uL (ref 150–400)
RBC: 4.57 MIL/uL (ref 3.87–5.11)
RDW: 12.2 % (ref 11.5–15.5)
WBC: 9 10*3/uL (ref 4.0–10.5)
nRBC: 0 % (ref 0.0–0.2)

## 2018-04-03 LAB — GLUCOSE, CAPILLARY
Glucose-Capillary: 100 mg/dL — ABNORMAL HIGH (ref 70–99)
Glucose-Capillary: 187 mg/dL — ABNORMAL HIGH (ref 70–99)
Glucose-Capillary: 111 mg/dL — ABNORMAL HIGH (ref 70–99)
Glucose-Capillary: 187 mg/dL — ABNORMAL HIGH (ref 70–99)

## 2018-04-03 LAB — HEMOGLOBIN A1C
Hgb A1c MFr Bld: 8.2 % — ABNORMAL HIGH (ref 4.8–5.6)
MEAN PLASMA GLUCOSE: 188.64 mg/dL

## 2018-04-03 LAB — LIPID PANEL
Cholesterol: 188 mg/dL (ref 0–200)
HDL: 34 mg/dL — ABNORMAL LOW (ref >40)
LDL Cholesterol: 110 mg/dL — ABNORMAL HIGH (ref 0–99)
Total CHOL/HDL Ratio: 5.5 RATIO
Triglycerides: 218 mg/dL — ABNORMAL HIGH (ref <150)
VLDL: 44 mg/dL — ABNORMAL HIGH (ref 0–40)

## 2018-04-03 LAB — MAGNESIUM: Magnesium: 1.8 mg/dL (ref 1.7–2.4)

## 2018-04-03 LAB — TSH: TSH: 1.607 u[IU]/mL (ref 0.350–4.500)

## 2018-04-03 LAB — ECHOCARDIOGRAM COMPLETE: Weight: 2338.64 oz

## 2018-04-03 NOTE — Evaluation (Addendum)
Occupational Therapy Evaluation Patient Details Name: Natalie Peterson MRN: [id number] DOB: 1926/12/14 Today's Date: 04/03/2018    History of Present Illness  84 y.o. female with past medical history significant for diabetes, hypertension, dementia was transferred from living facility to [facility] emergency department as patient appeared more confused today and unable to carry on a conversation. Last seen normal was yesterday night.  CT head suggestive for left parietal stroke.  An MRI brain was performed confirmed left MCA acute stroke.    Clinical Impression   Patient supine in bed and agreeable to OT session.  Patient admitted for above and limited by impaired balance, decreased activity tolerance, generalized weakness and cognition. Patient requires min guard for bed mobility and min assist for transfers, min assist for UB ADLs, mod assist for LB ADLs, and min assist for toileting.  Patient oriented to self only, has hx of dementia at baseline.  Pt unable to report ADL status as baseline, but lived at assisted living facility per chart review. Based on performance today, patient will benefit from continued OT services while admitted and after dc at SNF level in order to optimize independence and return to PLOF.  Will continue to follow.     Follow Up Recommendations  SNF;Supervision/Assistance - 24 hour    Equipment Recommendations  Other (comment)(TBD at next venue of care)    Recommendations for Other Services       Precautions / Restrictions Precautions Precautions: Fall Restrictions Weight Bearing Restrictions: No      Mobility Bed Mobility Overal bed mobility: Needs Assistance Bed Mobility: Supine to Sit     Supine to sit: Min guard     General bed mobility comments: increased time and effort, min guard for safety  Transfers Overall transfer level: Needs assistance Equipment used: Rolling walker (2 wheeled) Transfers: Sit to/from [employer] Sit to  Stand: Min assist Stand pivot transfers: Min assist       General transfer comment: min assist for safety and balance , cueing for hand placement     Balance Overall balance assessment: Needs assistance Sitting-balance support: Feet supported Sitting balance-Leahy Scale: Good     Standing balance support: No upper extremity supported;During functional activity Standing balance-Leahy Scale: Fair Standing balance comment: reliance on BUE support                            ADL either performed or assessed with clinical judgement   ADL Overall ADL's : Needs assistance/impaired Eating/Feeding: Set up;Sitting   Grooming: Set up;Sitting;Wash/dry hands;Wash/dry face;Brushing hair   Upper Body Bathing: Set up;Sitting   Lower Body Bathing: Moderate assistance;Sit to/from stand   Upper Body Dressing : Minimal assistance;Sitting   Lower Body Dressing: Moderate assistance;Sit to/from stand   Toilet Transfer: Minimal assistance;Stand-pivot;BSC   Toileting- Clothing Manipulation and Hygiene: Minimal assistance;Sit to/from stand       Functional mobility during ADLs: Minimal assistance;Rolling walker;Cueing for safety;Cueing for sequencing General ADL Comments: pt limited by decreased activity tolerance, generalized weakess and cognition     Vision   Additional Comments: further assessment required, pt able to locate items on meal tray L and R without cueing     Perception     Praxis      Pertinent Vitals/Pain Pain Assessment: No/denies pain     Hand Dominance Right   Extremity/Trunk Assessment Upper Extremity Assessment Upper Extremity Assessment: Generalized weakness   Lower Extremity Assessment Lower Extremity Assessment: Defer to PT  evaluation       Communication Communication Communication: HOH   Cognition Arousal/Alertness: Awake/alert Behavior During Therapy: WFL for tasks assessed/performed Overall Cognitive Status: History of cognitive  impairments - at baseline                                 General Comments: pt able to follow 1 step commands inconsistenly given increased time, hx of cognitive deficits at baseline   General Comments       Exercises     Shoulder Instructions      Home Living Family/patient expects to be discharged to:: Skilled nursing facility                                        Prior Functioning/Environment Level of Independence: Needs assistance  Gait / Transfers Assistance Needed: superivsion for mobility ADL's / Homemaking Assistance Needed: unsure, pt unableto report            OT Problem List: Decreased strength;Decreased activity tolerance;Impaired balance (sitting and/or standing);Decreased coordination;Decreased cognition;Decreased safety awareness;Decreased knowledge of use of DME or AE;Decreased knowledge of precautions      OT Treatment/Interventions: Self-care/ADL training;Therapeutic exercise;Neuromuscular education;DME and/or AE instruction;Therapeutic activities;Cognitive remediation/compensation;Patient/family education;Balance training    OT Goals(Current goals can be found in the care plan section) Acute Rehab OT Goals Patient Stated Goal: to go home OT Goal Formulation: With patient Time For Goal Achievement: 04/17/18 Potential to Achieve Goals: Good  OT Frequency: Min 2X/week   Barriers to D/C:            Co-evaluation              AM-PAC OT "6 Clicks" Daily Activity     Outcome Measure Help from another person eating meals?: A Little Help from another person taking care of personal grooming?: A Little Help from another person toileting, which includes using toliet, bedpan, or urinal?: A Little Help from another person bathing (including washing, rinsing, drying)?: A Lot Help from another person to put on and taking off regular upper body clothing?: A Little Help from another person to put on and taking off regular lower  body clothing?: A Lot 6 Click Score: 16   End of Session Equipment Utilized During Treatment: Gait belt;Rolling walker Nurse Communication: Mobility status  Activity Tolerance: Patient tolerated treatment well Patient left: in chair;with call bell/phone within reach;with chair alarm set  OT Visit Diagnosis: Unsteadiness on feet (R26.81);Other abnormalities of gait and mobility (R26.89);Muscle weakness (generalized) (M62.81);Other symptoms and signs involving cognitive function                Time: [id number] OT Time Calculation (min): 24 min Charges:  OT General Charges $OT Visit: 1 Visit OT Evaluation $OT Eval Moderate Complexity: 1 Mod OT Treatments $Self Care/Home Management : 8-22 mins  Chancy Milroyhristie S Anilah Huck, OT Acute Rehabilitation Services Pager [phone] Office [phone]   Chancy MilroyChristie S Jayon Matton 04/03/2018, 10:23 AM

## 2018-04-03 NOTE — Progress Notes (Signed)
PROGRESS NOTE  Natalie Peterson  JXB:[id number] DOB: 09-26-1926 DOA: 04/01/2018 PCP: Merlene Laughter, MD  Brief Narrative: Natalie Peterson is an 84 y.o. female with a history of NIDT2DM, HTN, and dementia who presented from ALF at [facility] due to AMS found to have left parietal CVA on CT with minimal petechial hemorrhage, suspected embolic. She was transferred to [facility] for continued stroke evaluation by neurology.   Assessment & Plan: Active Problems:   CVA (cerebral vascular accident) (HCC)  Left posterior MCA CVA: With resultant right hemianopsia and aphasia.  - Stroke neurology following, further evaluation planned, though TEE and ILR not planned due to baseline dementia.  - Continue telemetry. Echo pending.  - Started ASA full dose per neurology and high-intensity statin. LDL 110.  - Carotids without significant stenosis  Dementia:  - Agree with safety sitter due to risk of delirium, continue delirium precautions.  - Delirium precautions.  HTN: Permissive HTN  NIDT2DM: Not well controlled. HbA1c 8.2%.  - Hold januvia, give SSI for tight glucose control. CBGs largely at inpatient goal.  - Recommend follow up for augmentation of home regimen.  Leukocytosis: Afebrile, no pyuria or CXR infiltrate or cutaneous infection noted.  - Resolved off abx.   DVT prophylaxis: Lovenox Code Status: Full, will continue discussions with family as they arrive. Family Communication: None at bedside or reachable by phone today. Disposition Plan: SNF at [facility] 12/2.  Consultants:   Neurology  Procedures:   Echo pending  Antimicrobials:  None   Subjective: Aphasia continues, sleeping quietly. No complaints this morning.  Objective: Vitals:   04/02/18 2332 04/03/18 0400 04/03/18 0748 04/03/18 1212  BP: (!) 161/56 (!) 158/66 (!) 158/69 (!) 151/64  Pulse: 88 89 84 72  Resp: 18 18 16 18   Temp: 98.2 F (36.8 C) 98 F (36.7 C) 98 F (36.7 C) 98.6 F (37 C)  TempSrc:  Oral Oral Oral Oral  SpO2: 97% 99% 97% 97%  Weight:        Intake/Output Summary (Last 24 hours) at 04/03/2018 1547 Last data filed at 04/03/2018 1520 Gross per 24 hour  Intake 1399.55 ml  Output 1800 ml  Net -400.45 ml   Filed Weights   04/01/18 1938  Weight: 66.3 kg   Gen: 84 y.o. female in no distress  Pulm: Non-labored breathing . Clear to auscultation bilaterally.  CV: Regular rate and rhythm. No murmur, rub, or gallop. No JVD, no pedal edema. GI: Abdomen soft, non-tender, non-distended, with normoactive bowel sounds. No organomegaly or masses felt. Ext: Warm, no deformities Skin: No rashes, lesions or ulcers Neuro: Alert, not oriented, does not speak very many words. Poor awareness of right visual field.  Psych: Impaired by cognitive dysfunction  Data Reviewed: I have personally reviewed following labs and imaging studies  CBC: Recent Labs  Lab 04/01/18 1225 04/02/18 0005 04/03/18 0802  WBC 11.3* 13.0* 9.0  NEUTROABS  --   --  5.2  HGB 15.0 14.7 13.7  HCT 45.5 43.8 41.0  MCV 93.0 89.2 89.7  PLT 234 244 228   Basic Metabolic Panel: Recent Labs  Lab 04/01/18 1225 04/01/18 2126 04/03/18 0802  NA 137  --  137  K 4.6  --  3.8  CL 102  --  105  CO2 30  --  22  GLUCOSE 139*  --  123*  BUN 24*  --  15  CREATININE 1.07* 0.94 0.85  CALCIUM 10.0  --  8.7*  MG  --   --  1.8   GFR: CrCl cannot be calculated (Unknown ideal weight.). Liver Function Tests: Recent Labs  Lab 04/01/18 1225  AST 22  ALT 18  ALKPHOS 43  BILITOT 0.8  PROT 6.7  ALBUMIN 3.4*   No results for input(s): LIPASE, AMYLASE in the last 168 hours. No results for input(s): AMMONIA in the last 168 hours. Coagulation Profile: No results for input(s): INR, PROTIME in the last 168 hours. Cardiac Enzymes: No results for input(s): CKTOTAL, CKMB, CKMBINDEX, TROPONINI in the last 168 hours. BNP (last 3 results) No results for input(s): PROBNP in the last 8760 hours. HbA1C: Recent Labs     04/03/18 0802  HGBA1C 8.2*   CBG: Recent Labs  Lab 04/02/18 1139 04/02/18 1552 04/02/18 2114 04/03/18 0601 04/03/18 1136  GLUCAP 105* 123* 105* 100* 187*   Lipid Profile: Recent Labs    04/03/18 0802  CHOL 188  HDL 34*  LDLCALC 110*  TRIG 218*  CHOLHDL 5.5   Thyroid Function Tests: Recent Labs    04/03/18 0802  TSH 1.607   Anemia Panel: No results for input(s): VITAMINB12, FOLATE, FERRITIN, TIBC, IRON, RETICCTPCT in the last 72 hours. Urine analysis:    Component Value Date/Time   COLORURINE YELLOW 04/01/2018 1419   APPEARANCEUR CLEAR 04/01/2018 1419   LABSPEC 1.009 04/01/2018 1419   PHURINE 6.0 04/01/2018 1419   GLUCOSEU NEGATIVE 04/01/2018 1419   HGBUR NEGATIVE 04/01/2018 1419   BILIRUBINUR NEGATIVE 04/01/2018 1419   KETONESUR NEGATIVE 04/01/2018 1419   PROTEINUR NEGATIVE 04/01/2018 1419   NITRITE NEGATIVE 04/01/2018 1419   LEUKOCYTESUR NEGATIVE 04/01/2018 1419   Recent Results (from the past 240 hour(s))  Urine culture     Status: None   Collection Time: 04/01/18  2:19 PM  Result Value Ref Range Status   Specimen Description   Final    URINE, CLEAN CATCH Performed at [facility], [address], [place], [place] [address]    Special Requests   Final    NONE Performed at [facility], [address], [place], [place] [address]    Culture   Final    NO GROWTH Performed at [facility], [address] [place], [place] [address]    Report Status 04/02/2018 FINAL  Final  MRSA PCR Screening     Status: None   Collection Time: 04/01/18 11:49 PM  Result Value Ref Range Status   MRSA by PCR NEGATIVE NEGATIVE Final    Comment:        The GeneXpert MRSA Assay (FDA approved for NASAL specimens only), is one component of a comprehensive MRSA colonization surveillance program. It is not intended to diagnose MRSA infection nor to guide or monitor treatment for MRSA infections. Performed at [facility], [address] [place], [place] [address]       Radiology Studies: Ct Head Wo Contrast  Result Date: 04/01/2018 CLINICAL DATA:  Altered level of consciousness EXAM: CT HEAD WITHOUT CONTRAST TECHNIQUE: Contiguous axial images were obtained from the base of the skull through the vertex without intravenous contrast. COMPARISON:  None. FINDINGS: Brain: There is an area of low-density in the left parietal lobe compatible with acute to subacute infarct. No hemorrhage. No hydrocephalus or midline shift. Vascular: No hyperdense vessel or unexpected calcification. Skull: No acute calvarial abnormality. Sinuses/Orbits: Visualized paranasal sinuses and mastoids clear. Orbital soft tissues unremarkable. Other: None IMPRESSION: Low-density in the left parietal lobe compatible with acute to subacute infarction. No hemorrhage. Electronically Signed  By: Charlett Nose M.D.   On: 04/01/2018 16:03   Mr Brain Wo Contrast  Result Date: 04/01/2018 CLINICAL DATA:  Altered mental status EXAM: MRI HEAD WITHOUT CONTRAST TECHNIQUE: Multiplanar, multiecho pulse sequences of the brain and surrounding structures were obtained without intravenous contrast. COMPARISON:  None. FINDINGS: BRAIN: There is abnormal diffusion restriction within the posterior left MCA territory, within the left parietal and temporal lobes. The midline structures are normal. No midline shift or other mass effect. There is hyperintense T2-weighted signal that corresponds to the diffusion abnormality. Multifocal white matter hyperintensity, most commonly due to chronic ischemic microangiopathy. The cerebral and cerebellar volume are age-appropriate. There is a small amount of petechial hemorrhage at the infarct site. VASCULAR: Major intracranial arterial and venous sinus flow voids are normal. SKULL AND UPPER CERVICAL SPINE: Calvarial bone marrow signal is normal. There is no skull base mass. Visualized upper cervical spine and soft tissues are  normal. SINUSES/ORBITS: No fluid levels or advanced mucosal thickening. No mastoid or middle ear effusion. The orbits are normal. IMPRESSION: Posterior left MCA territory infarct with minimal petechial hemorrhage, Heidelberg classification 1a: HI1, scattered small petechiae, no mass effect. Electronically Signed   By: Deatra Robinson M.D.   On: 04/01/2018 22:45   Vas US Carotid (at [facility])  Result Date: 04/02/2018 Carotid Arterial Duplex Study Indications:  CVA and Altered mental status. Risk Factors: Hypertension, Diabetes. Performing Technologist: Sherren Kerns RVS  Examination Guidelines: A complete evaluation includes B-mode imaging, spectral Doppler, color Doppler, and power Doppler as needed of all accessible portions of each vessel. Bilateral testing is considered an integral part of a complete examination. Limited examinations for reoccurring indications may be performed as noted.  Right Carotid Findings: +----------+--------+--------+--------+------------+------------------+           PSV cm/sEDV cm/sStenosisDescribe    Comments           +----------+--------+--------+--------+------------+------------------+ CCA Prox  99      15                          intimal thickening +----------+--------+--------+--------+------------+------------------+ CCA Distal98      17                          intimal thickening +----------+--------+--------+--------+------------+------------------+ ICA Prox  60      18              heterogenous                   +----------+--------+--------+--------+------------+------------------+ ICA Distal83      20                                             +----------+--------+--------+--------+------------+------------------+ ECA       111     3                                              +----------+--------+--------+--------+------------+------------------+ +----------+--------+-------+--------+-------------------+           PSV  cm/sEDV cmsDescribeArm Pressure (mmHG) +----------+--------+-------+--------+-------------------+ [id number]                                         +----------+--------+-------+--------+-------------------+ +---------+--------+--+--------+-+  VertebralPSV cm/s54EDV cm/s9 +---------+--------+--+--------+-+  Left Carotid Findings: +----------+--------+-------+--------+------------+----------------------------+           PSV cm/sEDV    StenosisDescribe    Comments                                       cm/s                                                    +----------+--------+-------+--------+------------+----------------------------+ CCA Prox  84      11                         tortuos                      +----------+--------+-------+--------+------------+----------------------------+ CCA Distal114     17                         intimal thickening                                                        tortuosity                   +----------+--------+-------+--------+------------+----------------------------+ ICA Prox  115     22             heterogenoustortuous                     +----------+--------+-------+--------+------------+----------------------------+ ICA Distal56      14                         tortuous                     +----------+--------+-------+--------+------------+----------------------------+ ECA       125     9                                                       +----------+--------+-------+--------+------------+----------------------------+ +----------+--------+--------+--------+-------------------+ SubclavianPSV cm/sEDV cm/sDescribeArm Pressure (mmHG) +----------+--------+--------+--------+-------------------+           84                                          +----------+--------+--------+--------+-------------------+ +---------+--------+--+--------+--+ VertebralPSV cm/s55EDV cm/s13  +---------+--------+--+--------+--+  Summary: Right Carotid: The extracranial vessels were near-normal with only minimal wall                thickening or plaque. Left Carotid: The extracranial vessels were near-normal with only minimal wall               thickening or plaque. Vertebrals:  Bilateral vertebral arteries demonstrate antegrade flow. Subclavians: Normal flow hemodynamics were seen in bilateral subclavian  arteries. *See table(s) above for measurements and observations.  Electronically signed by Delia HeadyPramod Sethi MD on 04/02/2018 at 1:05:22 PM.    Final     Scheduled Meds: . enoxaparin (LOVENOX) injection  40 mg Subcutaneous Q24H  . insulin aspart  0-9 Units Subcutaneous TID WC  . senna-docusate  1 tablet Oral QHS   Continuous Infusions: . sodium chloride 75 mL/hr at 04/03/18 1029     LOS: 2 days   Time spent: 25 minutes.  Tyrone Nineyan B Seba Madole, MD [facility] [email or web address] Password [email or web address] 04/03/2018, 3:47 PM

## 2018-04-03 NOTE — Progress Notes (Signed)
  Echocardiogram 2D Echocardiogram has been performed.  Natalie Peterson, Natalie Peterson 04/03/2018, 2:03 PM

## 2018-04-03 NOTE — Progress Notes (Signed)
STROKE TEAM PROGRESS NOTE       SUBJECTIVE (INTERVAL HISTORY) No fa,ily present Pt alert but very aphasic - perseverates. No focal weakness noted    OBJECTIVE Vitals:   04/02/18 1924 04/02/18 2332 04/03/18 0400 04/03/18 0748  BP: (!) 163/62 (!) 161/56 (!) 158/66 (!) 158/69  Pulse: 95 88 89 84  Resp: (!) 22 18 18 16   Temp: 98.2 F (36.8 C) 98.2 F (36.8 C) 98 F (36.7 C) 98 F (36.7 C)  TempSrc: Oral Oral Oral Oral  SpO2: 96% 97% 99% 97%  Weight:        CBC:  Recent Labs  Lab 04/02/18 0005 04/03/18 0802  WBC 13.0* 9.0  NEUTROABS  --  5.2  HGB 14.7 13.7  HCT 43.8 41.0  MCV 89.2 89.7  PLT 244 228    Basic Metabolic Panel:  Recent Labs  Lab 04/01/18 1225 04/01/18 2126 04/03/18 0802  NA 137  --  137  K 4.6  --  3.8  CL 102  --  105  CO2 30  --  22  GLUCOSE 139*  --  123*  BUN 24*  --  15  CREATININE 1.07* 0.94 0.85  CALCIUM 10.0  --  8.7*  MG  --   --  1.8    Lipid Panel:     Component Value Date/Time   CHOL 188 04/03/2018 0802   TRIG 218 (H) 04/03/2018 0802   HDL 34 (L) 04/03/2018 0802   CHOLHDL 5.5 04/03/2018 0802   VLDL 44 (H) 04/03/2018 0802   LDLCALC 110 (H) 04/03/2018 0802   HgbA1c:  Lab Results  Component Value Date   HGBA1C 8.2 (H) 04/03/2018   Urine Drug Screen: No results found for: LABOPIA, COCAINSCRNUR, LABBENZ, AMPHETMU, THCU, LABBARB  Alcohol Level No results found for: [facility]  IMAGING   Dg Chest 2 View 04/01/2018 IMPRESSION:  No active cardiopulmonary disease.     Ct Head Wo Contrast 04/01/2018 IMPRESSION:  Low-density in the left parietal lobe compatible with acute to subacute infarction. No hemorrhage.   Mr Brain Wo Contrast 04/01/2018 IMPRESSION:  Posterior left MCA territory infarct with minimal petechial hemorrhage, Heidelberg classification 1a: HI1, scattered small petechiae, no mass effect.    Transthoracic Echocardiogram - pending 00/00/00    Bilateral Carotid Dopplers 04/02/2018 Summary: Right  Carotid: The extracranial vessels were near-normal with only minimal wall        thickening or plaque. Left Carotid: The extracranial vessels were near-normal with only minimal wall       thickening or plaque. Vertebrals: Bilateral vertebral arteries demonstrate antegrade flow. Subclavians: Normal flow hemodynamics were seen in bilateral subclavian       arteries.    PHYSICAL EXAM Blood pressure (!) 158/69, pulse 84, temperature 98 F (36.7 C), temperature source Oral, resp. rate 16, weight 66.3 kg, SpO2 97 %. Pleasant elderly Caucasian lady not in distress. . Afebrile. Head is nontraumatic. Neck is supple without bruit.    Cardiac exam no murmur or gallop. Lungs are clear to auscultation. Distal pulses are well felt. Neurological Exam :   Awake alert disoriented to time place and person.  Diminished attention, registration and recall.  Poor insight into her illness.  Extraocular movements are full range without nystagmus.  Blinks to threat on the left but not on the right.  Pupils irregular equal reactive.  Fundi not visualized.  Face is symmetric without weakness.  Tongue midline.  Motor system exam reveals symmetric upper and lower extremity strength without focal  weakness.  Tone and bulk appear normal reflexes are symmetric.  Plantars are downgoing.  Sensation appears intact.  Gait not tested.   HOME MEDICATIONS:  Medications Prior to Admission  Medication Sig Dispense Refill  . acetaminophen (TYLENOL) 500 MG tablet Take 500 mg by mouth every 4 (four) hours as needed for mild pain.    Marland Kitchen ammonium lactate (LAC-HYDRIN) 12 % lotion Apply at bedtime to legs, feet but not to toes. 400 g 1  . fexofenadine (ALLEGRA) 180 MG tablet Take 180 mg by mouth daily.    . fluocinonide (LIDEX) 0.05 % external solution Apply 1 application topically 2 (two) times daily as needed (scalp).    . gabapentin (NEURONTIN) 100 MG capsule Take 100 mg by mouth at bedtime.    Marland Kitchen glimepiride  (AMARYL) 4 MG tablet Take 4 mg by mouth daily with breakfast.   10  . JANUVIA 100 MG tablet Take 100 mg by mouth daily.   10  . lisinopril (PRINIVIL,ZESTRIL) 5 MG tablet Take 5 mg by mouth daily.   3  . Multiple Vitamins-Minerals (CEROVITE SENIOR) TABS Take 1 tablet by mouth daily.    Bertram Gala Glycol-Propyl Glycol (SYSTANE ULTRA) 0.4-0.3 % SOLN Apply 1 drop to eye 4 (four) times daily as needed (dry eyes/eye irritation).    . polyethylene glycol (MIRALAX / GLYCOLAX) packet Take 17 g by mouth daily.        HOSPITAL MEDICATIONS:  . enoxaparin (LOVENOX) injection  40 mg Subcutaneous Q24H  . insulin aspart  0-9 Units Subcutaneous TID WC  . senna-docusate  1 tablet Oral QHS    ALLERGIES No Known Allergies  PAST MEDICAL HISTORY Past Medical History:  Diagnosis Date  . Dementia (HCC)   . Diabetes mellitus without complication (HCC)   . Hypertension     SURGICAL HISTORY History reviewed. No pertinent surgical history.  FAMILY HISTORY History reviewed. No pertinent family history.  SOCIAL HISTORY  reports that she has never smoked. She has never used smokeless tobacco. She reports that she does not drink alcohol or use drugs.  ASSESSMENT/PLAN Natalie Peterson is a 84 y.o. female with history of diabetes, hypertension, dementia   presenting with increased confusion. She did not receive IV t-PA due to Late presentation.  Stroke: left MCA territory infarct - embolic - unknown source  Resultant aphasia with right hemianopsia superimposed on baseline dementia  CT head - Low-density in the left parietal lobe compatible with acute to subacute infarction.  MRI head - Posterior left MCA territory infarct with minimal petechial hemorrhage  Carotid Doppler - unremarkable  2D Echo - pending  LDL -110mg %  HgbA1c - 8.2  UDS - not performed  VTE prophylaxis - Lovenox  Diet - Heart healthy / carb modified with thin liquids.  No antithrombotic prior to admission, now on  aspirin 300 mg suppository daily  Ongoing aggressive stroke risk factor management  Therapy recommendations:  SNF  Disposition:  Pending  Hypertension  Stable . Permissive hypertension (OK if < 220/120) but gradually normalize in 5-7 days . Long-term BP goal normotensive  Hyperlipidemia  Lipid lowering medication PTA:  none  LDL 110mg % goal < 70  Current lipid lowering medication: none  Continue statin at discharge  Diabetes  HgbA1c 8.2, goal < 7.0  Unc / Controlled  Other Stroke Risk Factors  Advanced age   Other Active Problems  Dementia  Leukocytosis 11.0 -> 13.0 afebrile   Hospital day # 2     She has baseline  dementia and presented with increased confusion and MRI scan shows embolic left MCA infarct etiology to be determined.  Recommend continue ongoing stroke work-up.  Given her baseline dementia will not pursue aggressive work-up like TEE or loop recorder.  I had a long discussion with the patient's daughter over the phone on 04/02/18 and she will likely need rehab with 24-hour care post discharge.  Continue aspirin for now.aggressive risk factor modification. Check  Echocardiogram results. Stroke team will sign off. Kindly call for questions.  Delia HeadyPramod Angely Dietz, MD Medical Director [facility] Pager: [phone] 04/03/2018 11:49 AM   To contact Stroke Continuity provider, please refer to [email or web address]. After hours, contact General Neurology

## 2018-04-03 NOTE — NC FL2 (Signed)
  [place] MEDICAID FL2 LEVEL OF CARE SCREENING TOOL     IDENTIFICATION  Patient Name: Natalie PenningMargaret C Trabert Birthdate: 07/09/1926 Sex: female Admission Date (Current Location): 04/01/2018  [facility] [place]   Facility and Address:  [facility], [address], [place], [place] [address]      Provider Number: [id number]  Attending Physician Name and Address:  Tyrone NineGrunz, Ryan B, MD  Relative Name and Phone Number:       Current Level of Care: Hospital Recommended Level of Care: Skilled Nursing Facility Prior Approval Number:    Date Approved/Denied:   PASRR Number: [id number] [id number]  Discharge Plan: SNF    Current Diagnoses: Patient Active Problem List   Diagnosis Date Noted  . CVA (cerebral vascular accident) (HCC) 04/01/2018    Orientation RESPIRATION BLADDER Height & Weight     Self  Normal Continent Weight: 66.3 kg Height:     BEHAVIORAL SYMPTOMS/MOOD NEUROLOGICAL BOWEL NUTRITION STATUS      Continent Diet(please see DC summary)  AMBULATORY STATUS COMMUNICATION OF NEEDS Skin   Limited Assist Verbally Normal                       Personal Care Assistance Level of Assistance  Bathing, Feeding, Dressing Bathing Assistance: Limited assistance Feeding assistance: Independent Dressing Assistance: Limited assistance     Functional Limitations Info  Sight, Hearing, Speech Sight Info: Adequate Hearing Info: Adequate Speech Info: Adequate    SPECIAL CARE FACTORS FREQUENCY  PT (By licensed PT), OT (By licensed OT)     PT Frequency: 5x/week OT Frequency: 5x/week            Contractures Contractures Info: Not present    Additional Factors Info  Code Status, Allergies, Insulin Sliding Scale Code Status Info: Full Allergies Info: No Known Allergies   Insulin Sliding Scale Info: novolog 3x/day with meals       Current Medications (04/03/2018):  This is the current hospital active medication list Current  Facility-Administered Medications  Medication Dose Route Frequency Provider Last Rate Last Dose  . 0.9 %  sodium chloride infusion   Intravenous Continuous Albertine GratesXu, Fang, MD 75 mL/hr at 04/03/18 1029    . acetaminophen (TYLENOL) tablet 650 mg  650 mg Oral Q4H PRN Albertine GratesXu, Fang, MD       Or  . acetaminophen (TYLENOL) solution 650 mg  650 mg Per Tube Q4H PRN Albertine GratesXu, Fang, MD       Or  . acetaminophen (TYLENOL) suppository 650 mg  650 mg Rectal Q4H PRN Albertine GratesXu, Fang, MD      . enoxaparin (LOVENOX) injection 40 mg  40 mg Subcutaneous Q24H Albertine GratesXu, Fang, MD   40 mg at 04/02/18 2047  . insulin aspart (novoLOG) injection 0-9 Units  0-9 Units Subcutaneous TID WC Blount, Andi DevonXenia T, NP      . senna-docusate (Senokot-S) tablet 1 tablet  1 tablet Oral QHS Albertine GratesXu, Fang, MD   1 tablet at 04/02/18 2047     Discharge Medications: Please see discharge summary for a list of discharge medications.  Relevant Imaging Results:  Relevant Lab Results:   Additional Information SSN: [id number]  Abigail ButtsSusan Whitfield [place], LCSW

## 2018-04-03 NOTE — Progress Notes (Signed)
Patient not oriented and patient's family contacts not listed in chart. Obtained [facility], Natalie [facility]'s [facility] and added to face sheet. [facility]. Unable to reach [place] at listed numbers and unable to leave voicemail. Will continue to follow up to complete assessment. Recommendation is for SNF. [facility] will [facility] bed for patient, pending [facility] through [facility]. CSW to initiate auth after confirming plan for SNF with family and will follow for support.  Natalie ButtsSusan Calob Peterson, LCSWA [phone]

## 2018-04-04 ENCOUNTER — Encounter (HOSPITAL_COMMUNITY): Payer: Self-pay | Admitting: Cardiology

## 2018-04-04 DIAGNOSIS — I10 Essential (primary) hypertension: Secondary | ICD-10-CM

## 2018-04-04 DIAGNOSIS — I495 Sick sinus syndrome: Secondary | ICD-10-CM

## 2018-04-04 LAB — TROPONIN I: Troponin I: <0.03 ng/mL (ref <0.03)

## 2018-04-04 LAB — GLUCOSE, CAPILLARY
GLUCOSE-CAPILLARY: 154 mg/dL — AB (ref 70–99)
Glucose-Capillary: 123 mg/dL — ABNORMAL HIGH (ref 70–99)
Glucose-Capillary: 96 mg/dL (ref 70–99)
Glucose-Capillary: 134 mg/dL — ABNORMAL HIGH (ref 70–99)

## 2018-04-04 MED ORDER — ASPIRIN 325 MG PO TABS: 325.0000 mg | ORAL_TABLET | Freq: Every day | ORAL | Status: DC

## 2018-04-04 MED ORDER — ATORVASTATIN CALCIUM 80 MG PO TABS: 80.0000 mg | ORAL_TABLET | Freq: Every day | ORAL | Status: DC

## 2018-04-04 MED ORDER — ATORVASTATIN CALCIUM 80 MG PO TABS
80.0000 mg | ORAL_TABLET | Freq: Every day | ORAL | Status: DC
  Administered 2018-04-04: 80 mg via ORAL
  Filled 2018-04-04: qty 1

## 2018-04-04 MED ORDER — ASPIRIN 325 MG PO TABS
325.0000 mg | ORAL_TABLET | Freq: Every day | ORAL | Status: DC
  Administered 2018-04-04 – 2018-04-05 (×2): 325 mg via ORAL
  Filled 2018-04-04 (×2): qty 1

## 2018-04-04 NOTE — Clinical Social Work Note (Signed)
Clinical Social Work Assessment  Patient Details  Name: Natalie Peterson MRN: [id number] Date of Birth: October 04, 1926  Date of referral:  04/03/18               Reason for consult:  Facility Placement, Discharge Planning                Permission sought to share information with:  Facility Medical sales representativeContact Representative, Family Supports Permission granted to share information::  No(patient not oriented)  Name::     Natalie Peterson  Agency::  [facility]  Relationship::  daughter  Contact Information:  [phone]  Housing/Transportation Living arrangements for the past 2 months:  Independent Living Facility Source of Information:  Medical Team, Facility Patient Interpreter Needed:  None Criminal Activity/Legal Involvement Pertinent to Current Situation/Hospitalization:  No - Comment as needed Significant Relationships:  Adult Children Lives with:  Self, Facility Resident Do you feel safe going back to the place where you live?  Yes Need for family participation in patient care:  Yes (Comment)  Care giving concerns:  Pt is oriented to person. CSW spoke with pt's daughter Seward GraterMaggie (number found in ED note entered on 11/29).   Social Worker assessment / plan:  CSW spoke with pt's daughter via telephone. [facility] and pt's daughter is agreeable [facility]. Facility aware and will accept pt. CSW started [employer] Medicare auth.   Employment status:  Retired Firefighternsurance information:  Managed Medicare(Blue Medicare) PT Recommendations:  Skilled Nursing Facility Information / Referral to community resources:  Skilled Nursing Facility  Patient/Family's Response to care:  Pt's daughter verbalized understanding of CSW role and expressed appreciation for support. Pt's daughter denies any concern regarding pt care at this time.   Patient/Family's Understanding of and Emotional Response to Diagnosis, Current Treatment, and Prognosis:  Pt's daughter understanding and realistic  regarding pt's physical limitations. Pt's daughter understands the need for pt to go to SNF at d/c--Pt's daughter agreeable at this time.  Pt's daughter denies any concern regarding pt's treatment plan at this time. CSW will continue to provide support and facilitate d/c needs.   Emotional Assessment Appearance:  Appears stated age Attitude/Demeanor/Rapport:  Unable to Assess Affect (typically observed):  Unable to Assess Orientation:  Oriented to Self Alcohol / Substance use:  Not Applicable Psych involvement (Current and /or in the community):  No (Comment)  Discharge Needs  Concerns to be addressed:  Discharge Planning Concerns, Care Coordination Readmission within the last 30 days:  No Current discharge risk:  Physical Impairment, Cognitively Impaired Barriers to Discharge:  Continued Medical Work up, [employer] Authorization   London Tarnowski A Rhemi Balbach, LCSW 04/04/2018, 9:26 AM

## 2018-04-04 NOTE — Plan of Care (Signed)

## 2018-04-04 NOTE — Progress Notes (Signed)
Called the daughter Seward GraterMaggie and Melanee Spryan at the facility where the pt resides and made them aware that the discharge orders has been cancelled.

## 2018-04-04 NOTE — Progress Notes (Signed)
Update to plan of care. Patient initially stable for discharge to SNF today, though had bradycardic episode on exertion and sinus pause of 3 seconds. No reported symptoms, though pt's aphasia would limit ability to identify this reliably. Also noted to have WMA on echo and LBBB as mentioned in DC summary. Troponin negative and no chest pain, so planned cardiology follow up. Will ask for cardiology evaluation here. I appreciate their assistance.   Natalie Peterson Roneisha Stern, MD 04/04/2018 2:42 PM

## 2018-04-04 NOTE — Care Management Important Message (Signed)
Important Message  Patient Details  Name: Natalie PenningMargaret C Hepner MRN: [id number] Date of Birth: 1926/07/12   Medicare Important Message Given:  Yes    Perla Echavarria Stefan ChurchBratton 04/04/2018, 4:53 PM

## 2018-04-04 NOTE — Discharge Summary (Addendum)
Physician Discharge Summary  Natalie Peterson ZOX:[id number] DOB: 1926/10/22 DOA: 04/01/2018  PCP: Merlene Laughter, MD  Admit date: 04/01/2018 Discharge date: 04/04/2018  Admitted From: ILF Disposition: SNF   Recommendations for Outpatient Follow-up:  1. Follow up with PCP in 1-2 weeks 2. Please obtain BMP/CBC in one week 3. Follow up with neurology for stroke 4. Consider cardiology follow up for LBBB and wall motion abnormality on echo. Also had sinus bradycardia with pause though no further evaluation was recommended by cardiology at this time. No active chest pain, troponin undetectable.  Home Health: N/A Equipment/Devices: Per SNF Discharge Condition: Stable CODE STATUS: Full, recommend continuing discussions with family Diet recommendation: Heart healthy  Brief/Interim Summary: Natalie Peterson is an 84 y.o. female with a history of NIDT2DM, HTN, and dementia who presented from ALF due to AMS found to have left parietal CVA on CT with minimal petechial hemorrhage, suspected embolic. She was transferred to [facility] for continued stroke evaluation by neurology. Mental status remains stable, though the patient will require escalation of care to SNF per therapy recommendations. Work up as below did not reveal significant carotid stenosis, echocardiogram showed no cardioembolic source. TEE and loop recorder are not recommended per neurology. Started aspirin and high intensity statin.   Discharge Diagnoses:  Active Problems:   CVA (cerebral vascular accident) (HCC)  Left posterior MCA CVA: With resultant right hemianopsia and aphasia.  - Stroke neurology followed, will need follow up. No further evaluation planned due to baseline dementia.  - Continue telemetry. - Started ASA full dose per neurology and high-intensity statin. LDL 110.  - Carotids without significant stenosis, no cardioembolic source on echo.  LBBB: Unclear chronicity with no priors for comparison. Wall motion  abnormality on echo (see below for full report), though troponin negative, no chest pain.  - Consider cardiology follow up.   Sinus bradycardia with sinus pause: ~3 sec pause 12/2. Cardiology consulted. No further recommendations.  - Continue to avoid AV nodal agents/negative chronotropes.   Dementia:  - Delirium precautions.  HTN: Permissive HTN while admitted.  - Continue lisinopril  NIDT2DM: Not well controlled. HbA1c 8.2%.  - Continue home medications.  - Recommend follow up for augmentation of home regimen.  Leukocytosis: Afebrile, no pyuria or CXR infiltrate or cutaneous infection noted. Resolved without antimicrobials.   Discharge Instructions Discharge Instructions    Diet - low sodium heart healthy   Complete by:  As directed    Diet Carb Modified   Complete by:  As directed    Increase activity slowly   Complete by:  As directed      Allergies as of 04/04/2018   No Known Allergies     Medication List    TAKE these medications   acetaminophen 500 MG tablet Commonly known as:  TYLENOL Take 500 mg by mouth every 4 (four) hours as needed for mild pain.   ammonium lactate 12 % lotion Commonly known as:  LAC-HYDRIN Apply at bedtime to legs, feet but not to toes.   aspirin 325 MG tablet Take 1 tablet (325 mg total) by mouth daily.   atorvastatin 80 MG tablet Commonly known as:  LIPITOR Take 1 tablet (80 mg total) by mouth daily at 6 PM.   CEROVITE SENIOR Tabs Take 1 tablet by mouth daily.   fexofenadine 180 MG tablet Commonly known as:  ALLEGRA Take 180 mg by mouth daily.   fluocinonide 0.05 % external solution Commonly known as:  LIDEX Apply 1 application topically 2 (two)  times daily as needed (scalp).   gabapentin 100 MG capsule Commonly known as:  NEURONTIN Take 100 mg by mouth at bedtime.   glimepiride 4 MG tablet Commonly known as:  AMARYL Take 4 mg by mouth daily with breakfast.   JANUVIA 100 MG tablet Generic drug:  sitaGLIPtin Take  100 mg by mouth daily.   lisinopril 5 MG tablet Commonly known as:  PRINIVIL,ZESTRIL Take 5 mg by mouth daily.   polyethylene glycol packet Commonly known as:  MIRALAX / GLYCOLAX Take 17 g by mouth daily.   SYSTANE ULTRA 0.4-0.3 % Soln Generic drug:  Polyethyl Glycol-Propyl Glycol Apply 1 drop to eye 4 (four) times daily as needed (dry eyes/eye irritation).       No Known Allergies  Consultations:  Neurology  Procedures/Studies: Dg Chest 2 View  Result Date: 04/01/2018 CLINICAL DATA:  Altered mental status EXAM: CHEST - 2 VIEW COMPARISON:  None. FINDINGS: Normal heart size. Lungs are under aerated and grossly clear. No pneumothorax. No pleural effusion. IMPRESSION: No active cardiopulmonary disease. Electronically Signed   By: Jolaine ClickArthur  Hoss M.D.   On: 04/01/2018 13:35   Ct Head Wo Contrast  Result Date: 04/01/2018 CLINICAL DATA:  Altered level of consciousness EXAM: CT HEAD WITHOUT CONTRAST TECHNIQUE: Contiguous axial images were obtained from the base of the skull through the vertex without intravenous contrast. COMPARISON:  None. FINDINGS: Brain: There is an area of low-density in the left parietal lobe compatible with acute to subacute infarct. No hemorrhage. No hydrocephalus or midline shift. Vascular: No hyperdense vessel or unexpected calcification. Skull: No acute calvarial abnormality. Sinuses/Orbits: Visualized paranasal sinuses and mastoids clear. Orbital soft tissues unremarkable. Other: None IMPRESSION: Low-density in the left parietal lobe compatible with acute to subacute infarction. No hemorrhage. Electronically Signed   By: Charlett NoseKevin  Dover M.D.   On: 04/01/2018 16:03   Mr Brain Wo Contrast  Result Date: 04/01/2018 CLINICAL DATA:  Altered mental status EXAM: MRI HEAD WITHOUT CONTRAST TECHNIQUE: Multiplanar, multiecho pulse sequences of the brain and surrounding structures were obtained without intravenous contrast. COMPARISON:  None. FINDINGS: BRAIN: There is abnormal  diffusion restriction within the posterior left MCA territory, within the left parietal and temporal lobes. The midline structures are normal. No midline shift or other mass effect. There is hyperintense T2-weighted signal that corresponds to the diffusion abnormality. Multifocal white matter hyperintensity, most commonly due to chronic ischemic microangiopathy. The cerebral and cerebellar volume are age-appropriate. There is a small amount of petechial hemorrhage at the infarct site. VASCULAR: Major intracranial arterial and venous sinus flow voids are normal. SKULL AND UPPER CERVICAL SPINE: Calvarial bone marrow signal is normal. There is no skull base mass. Visualized upper cervical spine and soft tissues are normal. SINUSES/ORBITS: No fluid levels or advanced mucosal thickening. No mastoid or middle ear effusion. The orbits are normal. IMPRESSION: Posterior left MCA territory infarct with minimal petechial hemorrhage, Heidelberg classification 1a: HI1, scattered small petechiae, no mass effect. Electronically Signed   By: Deatra RobinsonKevin  Herman M.D.   On: 04/01/2018 22:45   Vas [place] Carotid [facility])  Result Date: 04/02/2018 Carotid Arterial Duplex Study Indications:  CVA and Altered mental status. Risk Factors: Hypertension, Diabetes. Performing Technologist: Sherren Kernsandace Kanady RVS  Examination Guidelines: A complete evaluation includes B-mode imaging, spectral Doppler, color Doppler, and power Doppler as needed of all accessible portions of each vessel. Bilateral testing is considered an integral part of a complete examination. Limited examinations for reoccurring indications may be performed as noted.  Right Carotid Findings: +----------+--------+--------+--------+------------+------------------+           PSV cm/sEDV cm/sStenosisDescribe    Comments           +----------+--------+--------+--------+------------+------------------+ CCA Prox  99      15                          intimal thickening  +----------+--------+--------+--------+------------+------------------+ CCA Distal98      17                          intimal thickening +----------+--------+--------+--------+------------+------------------+ ICA Prox  60      18              heterogenous                   +----------+--------+--------+--------+------------+------------------+ ICA Distal83      20                                             +----------+--------+--------+--------+------------+------------------+ ECA       111     3                                              +----------+--------+--------+--------+------------+------------------+ +----------+--------+-------+--------+-------------------+           PSV cm/sEDV cmsDescribeArm Pressure (mmHG) +----------+--------+-------+--------+-------------------+ [id number]                                         +----------+--------+-------+--------+-------------------+ +---------+--------+--+--------+-+ VertebralPSV cm/s54EDV cm/s9 +---------+--------+--+--------+-+  Left Carotid Findings: +----------+--------+-------+--------+------------+----------------------------+           PSV cm/sEDV    StenosisDescribe    Comments                                       cm/s                                                    +----------+--------+-------+--------+------------+----------------------------+ CCA Prox  84      11                         tortuos                      +----------+--------+-------+--------+------------+----------------------------+ CCA Distal114     17                         intimal thickening                                                        tortuosity                   +----------+--------+-------+--------+------------+----------------------------+  ICA Prox  115     22             heterogenoustortuous                      +----------+--------+-------+--------+------------+----------------------------+ ICA Distal56      14                         tortuous                     +----------+--------+-------+--------+------------+----------------------------+ ECA       125     9                                                       +----------+--------+-------+--------+------------+----------------------------+ +----------+--------+--------+--------+-------------------+ SubclavianPSV cm/sEDV cm/sDescribeArm Pressure (mmHG) +----------+--------+--------+--------+-------------------+           84                                          +----------+--------+--------+--------+-------------------+ +---------+--------+--+--------+--+ VertebralPSV cm/s55EDV cm/s13 +---------+--------+--+--------+--+  Summary: Right Carotid: The extracranial vessels were near-normal with only minimal wall                thickening or plaque. Left Carotid: The extracranial vessels were near-normal with only minimal wall               thickening or plaque. Vertebrals:  Bilateral vertebral arteries demonstrate antegrade flow. Subclavians: Normal flow hemodynamics were seen in bilateral subclavian              arteries. *See table(s) above for measurements and observations.  Electronically signed by Delia Heady MD on 04/02/2018 at 1:05:22 PM.    Final     Echocardiogram: - Left ventricle: The cavity size was normal. There was mild   concentric hypertrophy. Systolic function was normal. The   estimated ejection fraction was in the range of 55% to 60%. There   is hypokinesis of the mid-apicalanteroseptal myocardium. Doppler   parameters are consistent with abnormal left ventricular   relaxation (grade 1 diastolic dysfunction). - Ventricular septum: Septal motion showed abnormal function and   dyssynergy. - Aortic valve: There was mild regurgitation. - Mitral valve: Calcified annulus. There was mild regurgitation. -  Pulmonary arteries: Systolic pressure was mildly increased. PA   peak pressure: 33 mm Hg (S).  Impressions:  - No cardiac source of emboli was indentified.   Subjective: Discharged yesterday but developed what seems to be asymptomatic bradycardia and 3 sec pause. No events since that time. Denies any complaints. No headache today. Eating great.    Discharge Exam: Vitals:   04/04/18 0938 04/04/18 1215  BP: (!) 157/65 (!) 164/78  Pulse: 85 90  Resp: 17 17  Temp: 97.9 F (36.6 C) 98.2 F (36.8 C)  SpO2: 98% 98%   General: Elderly female in no distress Cardiovascular: RRR, no M/R/G. No JVD or edema. Respiratory: Nonlabored, clear Abdominal: Soft, NT, ND, bowel sounds + Neuro: Alert, aphasic, perseverative, pupils irregular equal, reactive. Not cooperative with full exam but no focal weakness noted in extremities.  Labs: BNP (last 3 results) No results for input(s): BNP in the last 8760 hours. Basic Metabolic Panel:  Recent Labs  Lab 04/01/18 1225 04/01/18 2126 04/03/18 0802  NA 137  --  137  K 4.6  --  3.8  CL 102  --  105  CO2 30  --  22  GLUCOSE 139*  --  123*  BUN 24*  --  15  CREATININE 1.07* 0.94 0.85  CALCIUM 10.0  --  8.7*  MG  --   --  1.8   Liver Function Tests: Recent Labs  Lab 04/01/18 1225  AST 22  ALT 18  ALKPHOS 43  BILITOT 0.8  PROT 6.7  ALBUMIN 3.4*   No results for input(s): LIPASE, AMYLASE in the last 168 hours. No results for input(s): AMMONIA in the last 168 hours. CBC: Recent Labs  Lab 04/01/18 1225 04/02/18 0005 04/03/18 0802  WBC 11.3* 13.0* 9.0  NEUTROABS  --   --  5.2  HGB 15.0 14.7 13.7  HCT 45.5 43.8 41.0  MCV 93.0 89.2 89.7  PLT 234 244 228   Cardiac Enzymes: Recent Labs  Lab 04/04/18 0825  TROPONINI <0.03   BNP: Invalid input(s): POCBNP CBG: Recent Labs  Lab 04/03/18 1136 04/03/18 1608 04/03/18 2126 04/04/18 0609 04/04/18 1116  GLUCAP 187* 111* 187* 123* 154*   D-Dimer No results for input(s):  DDIMER in the last 72 hours. Hgb A1c Recent Labs    04/03/18 0802  HGBA1C 8.2*   Lipid Profile Recent Labs    04/03/18 0802  CHOL 188  HDL 34*  LDLCALC 110*  TRIG 218*  CHOLHDL 5.5   Thyroid function studies Recent Labs    04/03/18 0802  TSH 1.607   Anemia work up No results for input(s): VITAMINB12, FOLATE, FERRITIN, TIBC, IRON, RETICCTPCT in the last 72 hours. Urinalysis    Component Value Date/Time   COLORURINE YELLOW 04/01/2018 1419   APPEARANCEUR CLEAR 04/01/2018 1419   LABSPEC 1.009 04/01/2018 1419   PHURINE 6.0 04/01/2018 1419   GLUCOSEU NEGATIVE 04/01/2018 1419   HGBUR NEGATIVE 04/01/2018 1419   BILIRUBINUR NEGATIVE 04/01/2018 1419   KETONESUR NEGATIVE 04/01/2018 1419   PROTEINUR NEGATIVE 04/01/2018 1419   NITRITE NEGATIVE 04/01/2018 1419   LEUKOCYTESUR NEGATIVE 04/01/2018 1419    Microbiology Recent Results (from the past 240 hour(s))  Urine culture     Status: None   Collection Time: 04/01/18  2:19 PM  Result Value Ref Range Status   Specimen Description   Final    URINE, CLEAN CATCH Performed at [facility], [address] [place], [place] [address]    Special Requests   Final    NONE Performed at [facility], [address] [place], [place] [address]    Culture   Final    NO GROWTH Performed at [facility], [address], [place], [place] [address]    Report Status 04/02/2018 FINAL  Final  MRSA PCR Screening     Status: None   Collection Time: 04/01/18 11:49 PM  Result Value Ref Range Status   MRSA by PCR NEGATIVE NEGATIVE Final    Comment:        The GeneXpert MRSA Assay (FDA approved for NASAL specimens only), is one component of a comprehensive MRSA colonization surveillance program. It is not intended to diagnose MRSA infection nor to guide or monitor treatment for MRSA infections. Performed at [facility], [address] [place], [place] [address]     Time  coordinating discharge: Approximately 40 minutes  Tyrone Nine, MD  [facility] 04/04/2018, 12:33 PM Pager [phone]

## 2018-04-04 NOTE — Consult Note (Addendum)
Cardiology Consultation:   Patient ID: KASSADY LABOY MRN: [id number]; DOB: [birth year]  Admit date: 04/01/2018 Date of Consult: 04/04/2018  Primary Care Provider: Merlene Laughter, MD Primary Cardiologist: Dietrich Pates, MD  Primary Electrophysiologist:  None    Patient Profile:   Natalie Peterson is a 84 y.o. female with a hx of HTN, DM-2, dementia living at [facility] who is being seen today for the evaluation of sinus Bradycardia at the request of Dr. Jarvis Newcomer after admit for acute CVA.  History of Present Illness:   Natalie Peterson with hx of HTN, DM-2, and dementia was admitted 04/01/18 with CVA-acute with altered mental status.  Now with isolated aphasia due to L MCA stroke.  Pt has been seen by OT/PT and plans were to discharge back to SNF.  She developed sinus brady and pause of 3 sec.  On no meds to cause brady.  Echo this admit with EF 55-60%,  There is hypokinesis of the mid-apicalanteroseptal myocardium, G1DD, mild AR and MR, PA pk pressure 33 mmHg.  Ventricular septum: Septal motion showed abnormal function and dyssynergy.  EKG on admit I personally reviewed ST with PACs and LBBB Telemetry:  Telemetry was personally reviewed and demonstrates:  SR , ST and then slowing around noon today to 28 with 3 sec pause in the middle   Troponin I today<0.03  T chol 188, TG 218, HDL 34 and LDL 110 TSH 1.607 NA 137, k+ 3.8, Cr 0.85 hgb 13.7.  BP 158/55.   Currently no complaints, denies any episodes of feeling lightheaded or dizzy.  Pt with dementia so not sure of her memory.    Past Medical History:  Diagnosis Date  . Dementia (HCC)   . Diabetes mellitus without complication (HCC)   . Hypertension     History reviewed. No pertinent surgical history.   Home Medications:  Prior to Admission medications   Medication Sig Start Date End Date Taking? Authorizing Provider  acetaminophen (TYLENOL) 500 MG tablet Take 500 mg by mouth every 4 (four) hours as needed for mild pain.    Yes [provider]  ammonium lactate (LAC-HYDRIN) 12 % lotion Apply at bedtime to legs, feet but not to toes. 12/29/17  Yes Helane Gunther, DPM  fexofenadine (ALLEGRA) 180 MG tablet Take 180 mg by mouth daily.   Yes [provider]  fluocinonide (LIDEX) 0.05 % external solution Apply 1 application topically 2 (two) times daily as needed (scalp).   Yes [provider]  gabapentin (NEURONTIN) 100 MG capsule Take 100 mg by mouth at bedtime.   Yes [provider]  glimepiride (AMARYL) 4 MG tablet Take 4 mg by mouth daily with breakfast.  03/03/18  Yes [provider]  JANUVIA 100 MG tablet Take 100 mg by mouth daily.  03/03/18  Yes [provider]  lisinopril (PRINIVIL,ZESTRIL) 5 MG tablet Take 5 mg by mouth daily.  11/25/17  Yes [provider]  Multiple Vitamins-Minerals (CEROVITE SENIOR) TABS Take 1 tablet by mouth daily.   Yes [provider]  Polyethyl Glycol-Propyl Glycol (SYSTANE ULTRA) 0.4-0.3 % SOLN Apply 1 drop to eye 4 (four) times daily as needed (dry eyes/eye irritation).   Yes [provider]  polyethylene glycol (MIRALAX / GLYCOLAX) packet Take 17 g by mouth daily.   Yes [provider]  aspirin 325 MG tablet Take 1 tablet (325 mg total) by mouth daily. 04/04/18   Tyrone Nine, MD  atorvastatin (LIPITOR) 80 MG tablet Take 1 tablet (80  mg total) by mouth daily at 6 PM. 04/04/18   Tyrone NineGrunz, Ryan B, MD    Inpatient Medications: Scheduled Meds: . aspirin  325 mg Oral Daily  . atorvastatin  80 mg Oral q1800  . enoxaparin (LOVENOX) injection  40 mg Subcutaneous Q24H  . insulin aspart  0-9 Units Subcutaneous TID WC  . senna-docusate  1 tablet Oral QHS   Continuous Infusions:  PRN Meds: acetaminophen **OR** acetaminophen (TYLENOL) oral liquid 160 mg/5 mL **OR** acetaminophen  Allergies:   No Known Allergies  Social History:   Social History   Socioeconomic History  . Marital status: Widowed     Spouse name: Not on file  . Number of children: Not on file  . Years of education: Not on file  . Highest education level: Not on file  Occupational History  . Not on file  Social Needs  . Financial resource strain: Not on file  . Food insecurity:    Worry: Not on file    Inability: Not on file  . Transportation needs:    Medical: Not on file    Non-medical: Not on file  Tobacco Use  . Smoking status: Never Smoker  . Smokeless tobacco: Never Used  Substance and Sexual Activity  . Alcohol use: No  . Drug use: No  . Sexual activity: Not on file  Lifestyle  . Physical activity:    Days per week: Not on file    Minutes per session: Not on file  . Stress: Not on file  Relationships  . Social connections:    Talks on phone: Not on file    Gets together: Not on file    Attends religious service: Not on file    Active member of club or organization: Not on file    Attends meetings of clubs or organizations: Not on file    Relationship status: Not on file  . Intimate partner violence:    Fear of current or ex partner: Not on file    Emotionally abused: Not on file    Physically abused: Not on file    Forced sexual activity: Not on file  Other Topics Concern  . Not on file  Social History Narrative  . Not on file    Family History:    Family History  Problem Relation Age of Onset  . Hypertension Mother   . Stroke Mother   . Hypertension Father   . Stroke Father      ROS:  Please see the history of present illness. Info was from H&P General:no colds or fevers, no weight changes Skin:no rashes or ulcers HEENT:no blurred vision, no congestion CV:see HPI PUL:see HPI GI:no diarrhea constipation or melena, no indigestion GU:no hematuria, no dysuria MS:no joint pain, no claudication Neuro:no syncope, no lightheadedness Endo:+ diabetes, no thyroid disease  All other ROS reviewed and negative.     Physical Exam/Data:   Vitals:   04/04/18 0432 04/04/18 0938  04/04/18 1215 04/04/18 1556  BP: (!) 176/59 (!) 157/65 (!) 164/78 (!) 158/55  Pulse: 81 85 90 70  Resp: 17 17 17 20   Temp: 98.4 F (36.9 C) 97.9 F (36.6 C) 98.2 F (36.8 C) 98.4 F (36.9 C)  TempSrc: Oral Oral Oral Oral  SpO2: 97% 98% 98% 97%  Weight:        Intake/Output Summary (Last 24 hours) at 04/04/2018 1705 Last data filed at 04/04/2018 1254 Gross per 24 hour  Intake -  Output 1400 ml  Net -1400 ml  Filed Weights   04/01/18 1938  Weight: 66.3 kg   There is no height or weight on file to calculate BMI.  General:  Well nourished, well developed, in no acute distress HEENT: normal Lymph: no adenopathy Neck: no JVD Endocrine:  No thryomegaly Vascular: No carotid bruits; pedal pulses 1+ bilaterally  Cardiac:  normal S1, S2; RRR; soft murmur  Lungs:  clear to auscultation bilaterally, no wheezing, rhonchi or rales  Abd: soft, nontender, no hepatomegaly  Ext: no edema Musculoskeletal:  No deformities, BUE and BLE strength normal and equal Skin: warm and dry  Neuro:  , no focal abnormalities noted Psych:  Normal affect    Relevant CV Studies: Echo 04/03/18 Study Conclusions  - Left ventricle: The cavity size was normal. There was mild   concentric hypertrophy. Systolic function was normal. The   estimated ejection fraction was in the range of 55% to 60%. There   is hypokinesis of the mid-apicalanteroseptal myocardium. Doppler   parameters are consistent with abnormal left ventricular   relaxation (grade 1 diastolic dysfunction). - Ventricular septum: Septal motion showed abnormal function and   dyssynergy. - Aortic valve: There was mild regurgitation. - Mitral valve: Calcified annulus. There was mild regurgitation. - Pulmonary arteries: Systolic pressure was mildly increased. PA   peak pressure: 33 mm Hg (S).  Impressions:  - No cardiac source of emboli was indentified.   Laboratory Data:  Chemistry Recent Labs  Lab 04/01/18 1225 04/01/18 2126  04/03/18 0802  NA 137  --  137  K 4.6  --  3.8  CL 102  --  105  CO2 30  --  22  GLUCOSE 139*  --  123*  BUN 24*  --  15  CREATININE 1.07* 0.94 0.85  CALCIUM 10.0  --  8.7*  GFRNONAA 46* 54* >60  GFRAA 53* >60 >60  ANIONGAP 5  --  10    Recent Labs  Lab 04/01/18 1225  PROT 6.7  ALBUMIN 3.4*  AST 22  ALT 18  ALKPHOS 43  BILITOT 0.8   Hematology Recent Labs  Lab 04/01/18 1225 04/02/18 0005 04/03/18 0802  WBC 11.3* 13.0* 9.0  RBC 4.89 4.91 4.57  HGB 15.0 14.7 13.7  HCT 45.5 43.8 41.0  MCV 93.0 89.2 89.7  MCH 30.7 29.9 30.0  MCHC 33.0 33.6 33.4  RDW 12.4 12.2 12.2  PLT 234 244 228   Cardiac Enzymes Recent Labs  Lab 04/04/18 0825  TROPONINI <0.03   No results for input(s): TROPIPOC in the last 168 hours.  BNPNo results for input(s): BNP, PROBNP in the last 168 hours.  DDimer No results for input(s): DDIMER in the last 168 hours.  Radiology/Studies:  Dg Chest 2 View  Result Date: 04/01/2018 CLINICAL DATA:  Altered mental status EXAM: CHEST - 2 VIEW COMPARISON:  None. FINDINGS: Normal heart size. Lungs are under aerated and grossly clear. No pneumothorax. No pleural effusion. IMPRESSION: No active cardiopulmonary disease. Electronically Signed   By: Jolaine Click M.D.   On: 04/01/2018 13:35   Ct Head Wo Contrast  Result Date: 04/01/2018 CLINICAL DATA:  Altered level of consciousness EXAM: CT HEAD WITHOUT CONTRAST TECHNIQUE: Contiguous axial images were obtained from the base of the skull through the vertex without intravenous contrast. COMPARISON:  None. FINDINGS: Brain: There is an area of low-density in the left parietal lobe compatible with acute to subacute infarct. No hemorrhage. No hydrocephalus or midline shift. Vascular: No hyperdense vessel or unexpected calcification. Skull: No acute calvarial  abnormality. Sinuses/Orbits: Visualized paranasal sinuses and mastoids clear. Orbital soft tissues unremarkable. Other: None IMPRESSION: Low-density in the left  parietal lobe compatible with acute to subacute infarction. No hemorrhage. Electronically Signed   By: Charlett Nose M.D.   On: 04/01/2018 16:03   Mr Brain Wo Contrast  Result Date: 04/01/2018 CLINICAL DATA:  Altered mental status EXAM: MRI HEAD WITHOUT CONTRAST TECHNIQUE: Multiplanar, multiecho pulse sequences of the brain and surrounding structures were obtained without intravenous contrast. COMPARISON:  None. FINDINGS: BRAIN: There is abnormal diffusion restriction within the posterior left MCA territory, within the left parietal and temporal lobes. The midline structures are normal. No midline shift or other mass effect. There is hyperintense T2-weighted signal that corresponds to the diffusion abnormality. Multifocal white matter hyperintensity, most commonly due to chronic ischemic microangiopathy. The cerebral and cerebellar volume are age-appropriate. There is a small amount of petechial hemorrhage at the infarct site. VASCULAR: Major intracranial arterial and venous sinus flow voids are normal. SKULL AND UPPER CERVICAL SPINE: Calvarial bone marrow signal is normal. There is no skull base mass. Visualized upper cervical spine and soft tissues are normal. SINUSES/ORBITS: No fluid levels or advanced mucosal thickening. No mastoid or middle ear effusion. The orbits are normal. IMPRESSION: Posterior left MCA territory infarct with minimal petechial hemorrhage, Heidelberg classification 1a: HI1, scattered small petechiae, no mass effect. Electronically Signed   By: Deatra Robinson M.D.   On: 04/01/2018 22:45   Vas US Carotid (at [facility] Only)  Result Date: 04/02/2018 Carotid Arterial Duplex Study Indications:  CVA and Altered mental status. Risk Factors: Hypertension, Diabetes. Performing Technologist: Sherren Kerns RVS  Examination Guidelines: A complete evaluation includes B-mode imaging, spectral Doppler, color Doppler, and power Doppler as needed of all accessible portions of each vessel. Bilateral  testing is considered an integral part of a complete examination. Limited examinations for reoccurring indications may be performed as noted.  Right Carotid Findings: +----------+--------+--------+--------+------------+------------------+           PSV cm/sEDV cm/sStenosisDescribe    Comments           +----------+--------+--------+--------+------------+------------------+ CCA Prox  99      15                          intimal thickening +----------+--------+--------+--------+------------+------------------+ CCA Distal98      17                          intimal thickening +----------+--------+--------+--------+------------+------------------+ ICA Prox  60      18              heterogenous                   +----------+--------+--------+--------+------------+------------------+ ICA Distal83      20                                             +----------+--------+--------+--------+------------+------------------+ ECA       111     3                                              +----------+--------+--------+--------+------------+------------------+ +----------+--------+-------+--------+-------------------+  PSV cm/sEDV cmsDescribeArm Pressure (mmHG) +----------+--------+-------+--------+-------------------+ [id number]                                         +----------+--------+-------+--------+-------------------+ +---------+--------+--+--------+-+ VertebralPSV cm/s54EDV cm/s9 +---------+--------+--+--------+-+  Left Carotid Findings: +----------+--------+-------+--------+------------+----------------------------+           PSV cm/sEDV    StenosisDescribe    Comments                                       cm/s                                                    +----------+--------+-------+--------+------------+----------------------------+ CCA Prox  84      11                         tortuos                       +----------+--------+-------+--------+------------+----------------------------+ CCA Distal114     17                         intimal thickening                                                        tortuosity                   +----------+--------+-------+--------+------------+----------------------------+ ICA Prox  115     22             heterogenoustortuous                     +----------+--------+-------+--------+------------+----------------------------+ ICA Distal56      14                         tortuous                     +----------+--------+-------+--------+------------+----------------------------+ ECA       125     9                                                       +----------+--------+-------+--------+------------+----------------------------+ +----------+--------+--------+--------+-------------------+ SubclavianPSV cm/sEDV cm/sDescribeArm Pressure (mmHG) +----------+--------+--------+--------+-------------------+           84                                          +----------+--------+--------+--------+-------------------+ +---------+--------+--+--------+--+ VertebralPSV cm/s55EDV cm/s13 +---------+--------+--+--------+--+  Summary: Right Carotid: The extracranial vessels were near-normal with only minimal wall                thickening or plaque. Left Carotid: The extracranial  vessels were near-normal with only minimal wall               thickening or plaque. Vertebrals:  Bilateral vertebral arteries demonstrate antegrade flow. Subclavians: Normal flow hemodynamics were seen in bilateral subclavian              arteries. *See table(s) above for measurements and observations.  Electronically signed by Delia Heady MD on 04/02/2018 at 1:05:22 PM.    Final     Assessment and Plan:   1. Sinus bradycardia with 3 sec pause.  And LBBB no old EKGs to compare.  Dr. Tenny Craw to see. Per pt no dizziness  No AV nodal blocking agents. Monitor  overnight but unless more significant brady no further workup  2. Acute CVA on ASA, statin and SSI 3. DM-2 on SSI  4. abnormal Echo- may be due to LBBB.      For questions or updates, please contact [facility] Please consult [email or web address] for contact info under     Signed, Nada Boozer, NP  04/04/2018 5:05 PM   Pt seen and examined   I agree with findings as noted above by L Ingold Pt is an 84 yo admitted with CVA     On telemetry today patient has an episode of SB at about 1 PM with 3 second pause   Not clear what she was doing at that time    Pt is not a good historian   Appears comfortable in bed Lungs are CTA  Cardiac RRR   Gr II/VI systolic murmur LSB  Abd is supple  Ext warm   No edema  EKG with SR   LBBB  Echo  LVEF is norma.   There is septal hypokinesis   May in part be due to conduction delay  1.   Sinus bradycardia with pause    Pt has been relatively hypertensive for most of day      Pt has conduction dz as demonstrated by LBBB   With age and other medical issues I would not pursue any other evaluation  2   LBBB   Again, with age and medical issues I would not pursue evaluation  3  HTN   Will need to be followed as outpt.   Currently a little high.     Dietrich Pates

## 2018-04-04 NOTE — Evaluation (Signed)
Speech Language Pathology Evaluation Patient Details Name: Natalie Peterson MRN: [id number] DOB: October 02, 1926 Today's Date: 04/04/2018 Time: [id number] SLP Time Calculation (min) (ACUTE ONLY): 20 min  Problem List:  Patient Active Problem List   Diagnosis Date Noted  . CVA (cerebral vascular accident) (HCC) 04/01/2018   Past Medical History:  Past Medical History:  Diagnosis Date  . Dementia (HCC)   . Diabetes mellitus without complication (HCC)   . Hypertension    Past Surgical History: History reviewed. No pertinent surgical history. HPI:  84 y.o. female with past medical history significant for diabetes, hypertension, dementia [facility] as patient appeared more confused today and unable to carry on a conversation. Last seen normal was yesterday night.  CT head suggestive for left parietal stroke.  An MRI brain was performed confirmed left MCA acute stroke.    Assessment / Plan / Recommendation Clinical Impression  Patient demonstrates moderate cognitive impairments impacting attention and recall which impacts her safety with functional and basic tasks. However, family not present, therefore, unable to determine baseline level of cognitive functioning. Patient also demonstrates a moderate aphasia characterized by impaired word-finding with perseveration. Patient demonstrates deficits in naming but was able to make her basic needs known at the phrase level and participate in a very basic conversation. Suspect patient's receptive language is grossly WFL, however, difficult to assess due to patient being severely hard of hearing combined with cognitive impairments. Patient would benefit from skilled SLP intervention to maximize her cognitive-linguistic functioning prior to discharge.     SLP Assessment  SLP Recommendation/Assessment: Patient needs continued Speech Lanaguage Pathology Services SLP Visit Diagnosis: Aphasia  (R47.01);Frontal lobe and executive function deficit Frontal lobe and executive function deficit following: Cerebral infarction    Follow Up Recommendations  Skilled Nursing facility;24 hour supervision/assistance    Frequency and Duration min 2x/week  1 week      SLP Evaluation Cognition  Overall Cognitive Status: History of cognitive impairments - at baseline Arousal/Alertness: Awake/alert Orientation Level: Oriented to place;Oriented to situation Attention: Selective Selective Attention: Impaired Selective Attention Impairment: Functional basic Memory: Impaired Memory Impairment: Decreased recall of new information;Decreased short term memory Awareness: Impaired Awareness Impairment: Emergent impairment       Comprehension  Auditory Comprehension Overall Auditory Comprehension: Impaired Yes/No Questions: Within Functional Limits Commands: Impaired One Step Basic Commands: 75-100% accurate Two Step Basic Commands: 75-100% accurate Multistep Basic Commands: 25-49% accurate Conversation: Simple Interfering Components: Hearing;Processing speed;Attention EffectiveTechniques: Increased volume;Visual/Gestural cues Visual Recognition/Discrimination Discrimination: Not tested Reading Comprehension Reading Status: Not tested    Expression Expression Primary Mode of Expression: Verbal Verbal Expression Overall Verbal Expression: Impaired Initiation: Impaired Automatic Speech: Name;Social Response Level of Generative/Spontaneous Verbalization: Phrase Naming: Impairment Responsive: 26-50% accurate Confrontation: Impaired Verbal Errors: Not aware of errors;Perseveration Interfering Components: Attention Written Expression Dominant Hand: Right Written Expression: Not tested   Oral / Motor  Oral Motor/Sensory Function Overall Oral Motor/Sensory Function: Within functional limits Motor Speech Overall Motor Speech: Appears within functional limits for tasks assessed    GO                    Alorah Mcree 04/04/2018, 1:24 PM  Feliberto Gottronourtney Emonii Wienke, MA, CCC-SLP [phone]

## 2018-04-04 NOTE — Progress Notes (Signed)
Called Dr Jarvis NewcomerGrunz regarding the [facility] stating the Ms. Natalie Peterson had bradied down to 28 but did not sustain and also had a 3.02sec asystole moment.  He stated he will look at the strip and proceed accordingly

## 2018-04-04 NOTE — Progress Notes (Signed)
Pt has a great appetite and drinks fluids well.  States that her headache is gone and she feels a lot better.  Pt appears to be comfortable at this time. Will continue to monitor the pt throughout the shift.

## 2018-04-04 NOTE — Clinical Social Work Placement (Signed)
   CLINICAL SOCIAL WORK PLACEMENT  NOTE  Date:  04/04/2018  Patient Details  Name: Natalie Peterson MRN: [id number] Date of Birth: [birth year]  Clinical Social Work is seeking post-discharge placement for this patient at the Skilled  Nursing Facility level of care (*CSW will initial, date and re-position this form in  chart as items are completed):      Patient/family provided with [facility] Clinical Social Work Department's list of facilities offering this level of care within the geographic area requested by the patient (or if unable, by the patient's family).  Yes   Patient/family informed of their freedom to choose among providers that offer the needed level of care, that participate in Medicare, Medicaid or managed care program needed by the patient, have an available bed and are willing to accept the patient.      Patient/family informed of [facility]'s ownership interest in [facility] and [facility], as well as of the fact that they are under no obligation to receive care at these facilities.  PASRR submitted to EDS on       PASRR number received on 04/03/18     Existing PASRR number confirmed on       FL2 transmitted to all facilities in geographic area requested by pt/family on 04/03/18     FL2 transmitted to all facilities within larger geographic area on       Patient informed that his/her managed care company has contracts with or will negotiate with certain facilities, including the following:        Yes   Patient/family informed of bed offers received.  [facility]     Physician recommends and patient chooses bed at      [facility]  Patient to be transferred to facility by [facility]     Patient family notified on 04/04/18 of transfer.  Name of family member notified:  [name]-daughter     PHYSICIAN Please prepare priority discharge summary, including medications, Please prepare  prescriptions     Additional Comment:    _______________________________________________ Maree KrabbeBridget A Harutyun Monteverde, LCSW 04/04/2018, 2:35 PM

## 2018-04-05 DIAGNOSIS — Z111 Encounter for screening for respiratory tuberculosis: Secondary | ICD-10-CM | POA: Diagnosis not present

## 2018-04-05 DIAGNOSIS — M7989 Other specified soft tissue disorders: Secondary | ICD-10-CM | POA: Diagnosis not present

## 2018-04-05 DIAGNOSIS — T7840XD Allergy, unspecified, subsequent encounter: Secondary | ICD-10-CM | POA: Diagnosis not present

## 2018-04-05 DIAGNOSIS — R41841 Cognitive communication deficit: Secondary | ICD-10-CM | POA: Diagnosis not present

## 2018-04-05 DIAGNOSIS — R234 Changes in skin texture: Secondary | ICD-10-CM | POA: Diagnosis not present

## 2018-04-05 DIAGNOSIS — I10 Essential (primary) hypertension: Secondary | ICD-10-CM | POA: Diagnosis not present

## 2018-04-05 DIAGNOSIS — Z7401 Bed confinement status: Secondary | ICD-10-CM | POA: Diagnosis not present

## 2018-04-05 DIAGNOSIS — R2689 Other abnormalities of gait and mobility: Secondary | ICD-10-CM | POA: Diagnosis not present

## 2018-04-05 DIAGNOSIS — I69320 Aphasia following cerebral infarction: Secondary | ICD-10-CM | POA: Diagnosis not present

## 2018-04-05 DIAGNOSIS — F0390 Unspecified dementia without behavioral disturbance: Secondary | ICD-10-CM | POA: Diagnosis not present

## 2018-04-05 DIAGNOSIS — H04129 Dry eye syndrome of unspecified lacrimal gland: Secondary | ICD-10-CM | POA: Diagnosis not present

## 2018-04-05 DIAGNOSIS — R109 Unspecified abdominal pain: Secondary | ICD-10-CM | POA: Diagnosis not present

## 2018-04-05 DIAGNOSIS — M6281 Muscle weakness (generalized): Secondary | ICD-10-CM | POA: Diagnosis not present

## 2018-04-05 DIAGNOSIS — K5900 Constipation, unspecified: Secondary | ICD-10-CM | POA: Diagnosis not present

## 2018-04-05 DIAGNOSIS — M79642 Pain in left hand: Secondary | ICD-10-CM | POA: Diagnosis not present

## 2018-04-05 DIAGNOSIS — M2550 Pain in unspecified joint: Secondary | ICD-10-CM | POA: Diagnosis not present

## 2018-04-05 DIAGNOSIS — I6300 Cerebral infarction due to thrombosis of unspecified precerebral artery: Secondary | ICD-10-CM | POA: Diagnosis not present

## 2018-04-05 DIAGNOSIS — E569 Vitamin deficiency, unspecified: Secondary | ICD-10-CM | POA: Diagnosis not present

## 2018-04-05 DIAGNOSIS — F0150 Vascular dementia without behavioral disturbance: Secondary | ICD-10-CM | POA: Diagnosis not present

## 2018-04-05 DIAGNOSIS — E119 Type 2 diabetes mellitus without complications: Secondary | ICD-10-CM | POA: Diagnosis not present

## 2018-04-05 DIAGNOSIS — E785 Hyperlipidemia, unspecified: Secondary | ICD-10-CM | POA: Diagnosis not present

## 2018-04-05 DIAGNOSIS — R001 Bradycardia, unspecified: Secondary | ICD-10-CM | POA: Diagnosis not present

## 2018-04-05 DIAGNOSIS — D72829 Elevated white blood cell count, unspecified: Secondary | ICD-10-CM | POA: Diagnosis not present

## 2018-04-05 DIAGNOSIS — I63532 Cerebral infarction due to unspecified occlusion or stenosis of left posterior cerebral artery: Secondary | ICD-10-CM | POA: Diagnosis not present

## 2018-04-05 DIAGNOSIS — I447 Left bundle-branch block, unspecified: Secondary | ICD-10-CM | POA: Diagnosis not present

## 2018-04-05 LAB — GLUCOSE, CAPILLARY
Glucose-Capillary: 100 mg/dL — ABNORMAL HIGH (ref 70–99)
Glucose-Capillary: 119 mg/dL — ABNORMAL HIGH (ref 70–99)

## 2018-04-05 NOTE — Progress Notes (Signed)
Occupational Therapy Treatment Patient Details Name: Natalie PenningMargaret C Peterson MRN: [id number] DOB: 02-Jul-1926 Today's Date: 04/05/2018    History of present illness  84 y.o. female with past medical history significant for diabetes, hypertension, dementia was transferred [facility] living facility [facility] department as patient appeared more confused today and unable to carry on a conversation. Last seen normal was yesterday night.  CT head suggestive for left parietal stroke.  An MRI brain was performed confirmed left MCA acute stroke.    OT comments  Pt seen for OT ADL retraining session today with focus on bed mobility, grooming sitting, toileting transfers and hygiene. Pt expressed that she was "cold" therefore room temperature was adjusted and blanket was placed. Pt elected getting back to bed vs sitting up in recliner chair. Cont to be limited by cognition/expression and generalized weakness. Overall Min guard assist.   Follow Up Recommendations  SNF;Supervision/Assistance - 24 hour    Equipment Recommendations  Other (comment)(Defer to next venue)    Recommendations for Other Services      Precautions / Restrictions Precautions Precautions: Fall Restrictions Weight Bearing Restrictions: No       Mobility Bed Mobility Overal bed mobility: Needs Assistance Bed Mobility: Supine to Sit;Sit to Supine     Supine to sit: Supervision;HOB elevated Sit to supine: Min guard   General bed mobility comments: Increased time and effort, min guard for safety when getting back to bed  Transfers Overall transfer level: Needs assistance Equipment used: Rolling walker (2 wheeled) Transfers: Sit to/from [employer]Stand;Stand Pivot Transfers Sit to Stand: Min guard Stand pivot transfers: Min guard       General transfer comment: Min guard for safety and balance , cueing for hand placement     Balance Overall balance assessment: Needs assistance Sitting-balance support: Feet supported Sitting  balance-Leahy Scale: Good     Standing balance support: No upper extremity supported;During functional activity Standing balance-Leahy Scale: Fair                             ADL either performed or assessed with clinical judgement   ADL Overall ADL's : Needs assistance/impaired Eating/Feeding: Set up;Sitting   Grooming: Wash/dry hands;Wash/dry face;Brushing hair;Set up;Sitting Grooming Details (indicate cue type and reason): VC's & increased time for tasks                 Toilet Transfer: Min guard;BSC;Stand-pivot;Ambulation   Toileting- Clothing Manipulation and Hygiene: Min guard;Sitting/lateral lean;Sit to/from stand;Cueing for safety       Functional mobility during ADLs: Min guard;Rolling walker;Cueing for safety;Cueing for sequencing General ADL Comments: Pt seen for OT ADL retraining session today with focus on bed mobility, grooming sitting, toileting transfers and hygiene. Pt expressed that she was "cold" therefore room temperature was adjusted and blanket was placed. Pt elected getting back to bed vs sitting up in recliner chair. Cont to be limited by cognition/expression and generalized weakness. Overall Min guard assist..     Vision       Perception     Praxis      Cognition Arousal/Alertness: Awake/alert Behavior During Therapy: WFL for tasks assessed/performed Overall Cognitive Status: History of cognitive impairments - at baseline                                 General Comments: pt able to follow 1 step commands inconsistenly given increased time, hx  of cognitive deficits at baseline        Exercises     Shoulder Instructions       General Comments      Pertinent Vitals/ Pain       Pain Assessment: Faces Faces Pain Scale: Hurts a little bit Pain Location: Pt holding head when sitting on 3:1, was asked x3 if she was hurting and was unable to confirm. Pain Descriptors / Indicators: Grimacing;Aching Pain  Intervention(s): Limited activity within patient's tolerance;Monitored during session;Repositioned  Home Living                                          Prior Functioning/Environment              Frequency  Min 2X/week        Progress Toward Goals  OT Goals(current goals can now be found in the care plan section)  Progress towards OT goals: Progressing toward goals  Acute Rehab OT Goals Patient Stated Goal: None stated  Plan Discharge plan remains appropriate    Co-evaluation                 AM-PAC OT "6 Clicks" Daily Activity     Outcome Measure   Help from another person eating meals?: A Little Help from another person taking care of personal grooming?: A Little Help from another person toileting, which includes using toliet, bedpan, or urinal?: A Little Help from another person bathing (including washing, rinsing, drying)?: A Little Help from another person to put on and taking off regular upper body clothing?: A Little Help from another person to put on and taking off regular lower body clothing?: A Lot 6 Click Score: 17    End of Session Equipment Utilized During Treatment: Gait belt;Rolling walker  OT Visit Diagnosis: Unsteadiness on feet (R26.81);Other abnormalities of gait and mobility (R26.89);Muscle weakness (generalized) (M62.81);Other symptoms and signs involving cognitive function   Activity Tolerance Patient tolerated treatment well   Patient Left in bed;with call bell/phone within reach;with bed alarm set   Nurse Communication          Time: [phone] OT Time Calculation (min): 18 min  Charges: OT General Charges $OT Visit: 1 Visit OT Treatments $Self Care/Home Management : 8-22 mins    Barnhill, Amy Beth Dixon, OTR/L 04/05/2018, 10:04 AM

## 2018-04-05 NOTE — Progress Notes (Signed)
[facility] PTAR present to pick up patient. No IV present and telemetry discontinued.  Discharge information given to caregiver at facility. Lawson RadarHeather M Nycole Kawahara

## 2018-04-05 NOTE — Progress Notes (Signed)
Discharge to: [facility] Anticipated discharge date: 04/05/18 Family notified: Yes, daughter Seward GraterMaggie, by phone Transportation by: PTAR  Report #: [id number], Room 603B  CSW signing off.  Blenda Nicelylizabeth Lajuane Leatham LCSW [phone]

## 2018-04-05 NOTE — Progress Notes (Signed)
Physical Therapy Treatment Patient Details Name: Natalie Peterson MRN: [id number] DOB: Dec 02, 1926 Today's Date: 04/05/2018    History of Present Illness  84 y.o. female with past medical history significant for diabetes, hypertension, dementia was transferred from living facility to [facility] emergency department as patient appeared more confused today and unable to carry on a conversation. Last seen normal was yesterday night.  CT head suggestive for left parietal stroke.  An MRI brain was performed confirmed left MCA acute stroke.     PT Comments    Patient's tolerance to treatment today was good.  Patient was in bed with no visitors present upon PT arrival.  Patient was noted to be confused and only A&Ox2.  Patient's confusion more apparent when nutrition entered with patient not providing appropriate responses to questions concerning lunch menu.  Pt utilized bed railing for support during bed mobility.  Pt ambulated in hallway with RW requiring min VC to look forward.  I have discussed the patient's current level of function related to strength and mobility with the patient.  She acknowledges understanding of this and does not feel she would be able to have her care needs met at home.  She is interested in post-acute rehab in an inpatient setting.  Patient continues to be a good candidate for SNF placement at this time based on current functional status.   Follow Up Recommendations  SNF;Supervision/Assistance - 24 hour     Equipment Recommendations  None recommended by PT    Recommendations for Other Services       Precautions / Restrictions Precautions Precautions: Fall Restrictions Weight Bearing Restrictions: No    Mobility  Bed Mobility Overal bed mobility: Needs Assistance Bed Mobility: Supine to Sit;Sit to Supine     Supine to sit: Supervision;HOB elevated Sit to supine: Min guard   General bed mobility comments: Pt required min guard for safety when getting back to bed  and did not reach for bed surface after VC provided.  Transfers Overall transfer level: Needs assistance Equipment used: Rolling walker (2 wheeled) Transfers: Sit to/from Stand Sit to Stand: Min guard Stand pivot transfers: Min guard       General transfer comment: Min guard for safety and balance , cueing for hand placement   Ambulation/Gait Ambulation/Gait assistance: Min assist Gait Distance (Feet): 160 Feet Assistive device: Rolling walker (2 wheeled) Gait Pattern/deviations: Step-through pattern;Decreased stride length Gait velocity: decreased   General Gait Details: Pt required VC to look forward during ambulation and reliant on UE support from RW.   Stairs             Wheelchair Mobility    Modified Rankin (Stroke Patients Only) Modified Rankin (Stroke Patients Only) Pre-Morbid Rankin Score: No symptoms Modified Rankin: Moderate disability     Balance Overall balance assessment: Needs assistance Sitting-balance support: Feet supported Sitting balance-Leahy Scale: Good     Standing balance support: Bilateral upper extremity supported;During functional activity Standing balance-Leahy Scale: Fair Standing balance comment: reliance on BUE support                             Cognition Arousal/Alertness: Awake/alert Behavior During Therapy: WFL for tasks assessed/performed Overall Cognitive Status: History of cognitive impairments - at baseline                                 General Comments: A&Ox2 (disoriented to month  and situation)      Exercises General Exercises - Lower Extremity Long Arc Quad: AROM;Both;10 reps;Seated    General Comments        Pertinent Vitals/Pain Pain Assessment: No/denies pain Faces Pain Scale: Hurts a little bit Pain Location: Pt holding head when sitting on 3:1, was asked x3 if she was hurting and was unable to confirm. Pain Descriptors / Indicators: Grimacing;Aching Pain Intervention(s):  Limited activity within patient's tolerance;Monitored during session;Repositioned    Home Living                      Prior Function            PT Goals (current goals can now be found in the care plan section) Acute Rehab PT Goals Patient Stated Goal: None stated PT Goal Formulation: With patient Time For Goal Achievement: 04/16/18 Potential to Achieve Goals: Good Progress towards PT goals: Progressing toward goals    Frequency    Min 3X/week      PT Plan      Co-evaluation              AM-PAC PT "6 Clicks" Mobility   Outcome Measure  Help needed turning from your back to your side while in a flat bed without using bedrails?: A Little Help needed moving from lying on your back to sitting on the side of a flat bed without using bedrails?: A Little Help needed moving to and from a bed to a chair (including a wheelchair)?: A Little Help needed standing up from a chair using your arms (e.g., wheelchair or bedside chair)?: A Little Help needed to walk in hospital room?: A Lot Help needed climbing 3-5 steps with a railing? : Total 6 Click Score: 15    End of Session Equipment Utilized During Treatment: Gait belt Activity Tolerance: Patient tolerated treatment well Patient left: in bed;with call bell/phone within reach;with bed alarm set Nurse Communication: Mobility status PT Visit Diagnosis: Difficulty in walking, not elsewhere classified (R26.2);Other symptoms and signs involving the nervous system (R29.898)     Time: 1517-6160 PT Time Calculation (min) (ACUTE ONLY): 20 min  Charges:  $Gait Training: 8-22 mins                     [address] SPTA    Ismar Yabut 04/05/2018, 12:30 PM

## 2018-04-06 DIAGNOSIS — R001 Bradycardia, unspecified: Secondary | ICD-10-CM | POA: Diagnosis not present

## 2018-04-06 DIAGNOSIS — I63532 Cerebral infarction due to unspecified occlusion or stenosis of left posterior cerebral artery: Secondary | ICD-10-CM | POA: Diagnosis not present

## 2018-04-06 DIAGNOSIS — F0150 Vascular dementia without behavioral disturbance: Secondary | ICD-10-CM | POA: Diagnosis not present

## 2018-04-06 DIAGNOSIS — I447 Left bundle-branch block, unspecified: Secondary | ICD-10-CM | POA: Diagnosis not present

## 2018-04-07 DIAGNOSIS — M6281 Muscle weakness (generalized): Secondary | ICD-10-CM | POA: Diagnosis not present

## 2018-04-07 DIAGNOSIS — R001 Bradycardia, unspecified: Secondary | ICD-10-CM | POA: Diagnosis not present

## 2018-04-07 DIAGNOSIS — I63532 Cerebral infarction due to unspecified occlusion or stenosis of left posterior cerebral artery: Secondary | ICD-10-CM | POA: Diagnosis not present

## 2018-04-07 DIAGNOSIS — I447 Left bundle-branch block, unspecified: Secondary | ICD-10-CM | POA: Diagnosis not present

## 2018-04-08 DIAGNOSIS — I447 Left bundle-branch block, unspecified: Secondary | ICD-10-CM | POA: Diagnosis not present

## 2018-04-08 DIAGNOSIS — I63532 Cerebral infarction due to unspecified occlusion or stenosis of left posterior cerebral artery: Secondary | ICD-10-CM | POA: Diagnosis not present

## 2018-04-08 DIAGNOSIS — F0150 Vascular dementia without behavioral disturbance: Secondary | ICD-10-CM | POA: Diagnosis not present

## 2018-04-08 DIAGNOSIS — R001 Bradycardia, unspecified: Secondary | ICD-10-CM | POA: Diagnosis not present

## 2018-04-11 DIAGNOSIS — I63532 Cerebral infarction due to unspecified occlusion or stenosis of left posterior cerebral artery: Secondary | ICD-10-CM | POA: Diagnosis not present

## 2018-04-11 DIAGNOSIS — R001 Bradycardia, unspecified: Secondary | ICD-10-CM | POA: Diagnosis not present

## 2018-04-11 DIAGNOSIS — M6281 Muscle weakness (generalized): Secondary | ICD-10-CM | POA: Diagnosis not present

## 2018-04-11 DIAGNOSIS — I447 Left bundle-branch block, unspecified: Secondary | ICD-10-CM | POA: Diagnosis not present

## 2018-04-14 ENCOUNTER — Telehealth: Payer: Self-pay | Admitting: Podiatry

## 2018-04-15 DIAGNOSIS — R001 Bradycardia, unspecified: Secondary | ICD-10-CM | POA: Diagnosis not present

## 2018-04-15 DIAGNOSIS — M6281 Muscle weakness (generalized): Secondary | ICD-10-CM | POA: Diagnosis not present

## 2018-04-15 DIAGNOSIS — I63532 Cerebral infarction due to unspecified occlusion or stenosis of left posterior cerebral artery: Secondary | ICD-10-CM | POA: Diagnosis not present

## 2018-04-15 DIAGNOSIS — I447 Left bundle-branch block, unspecified: Secondary | ICD-10-CM | POA: Diagnosis not present

## 2018-04-18 NOTE — Telephone Encounter (Signed)
I informed pt's dtr, [name] of pt's current diagnosis as evaluated by Dr. Stacie AcresMayer, and that her gabapentin for the neuropathy was managed by Dr. Worthy RancherSutphin. [name] states pt's feet swell and she would like to get her shoes. I told her to get soft sided athletic tie shoes and make sure they are big enough without being so loose to fall off or trip and have the aides in her care facility tie the shoes and may wear sock to cushion and stabilize shoe fit from rubbing.

## 2018-04-18 NOTE — Telephone Encounter (Signed)
I would like to have some information from my mothers last visit which was 30 March 2018. Since that visit, she has had a stroke and is now in the [facility] wellness center. I'm her POA and so I'm wanting to find out more about her condition and maybe ask a few questions. My number is [phone]. [facility] in [place] [facility], but I'll be going back home to [place] on Sunday. Thank you.

## 2018-04-20 DIAGNOSIS — H353221 Exudative age-related macular degeneration, left eye, with active choroidal neovascularization: Secondary | ICD-10-CM | POA: Diagnosis not present

## 2018-04-22 DIAGNOSIS — I63532 Cerebral infarction due to unspecified occlusion or stenosis of left posterior cerebral artery: Secondary | ICD-10-CM | POA: Diagnosis not present

## 2018-04-22 DIAGNOSIS — R001 Bradycardia, unspecified: Secondary | ICD-10-CM | POA: Diagnosis not present

## 2018-04-22 DIAGNOSIS — M6281 Muscle weakness (generalized): Secondary | ICD-10-CM | POA: Diagnosis not present

## 2018-04-22 DIAGNOSIS — I447 Left bundle-branch block, unspecified: Secondary | ICD-10-CM | POA: Diagnosis not present

## 2018-05-12 DIAGNOSIS — R001 Bradycardia, unspecified: Secondary | ICD-10-CM | POA: Diagnosis not present

## 2018-05-12 DIAGNOSIS — I447 Left bundle-branch block, unspecified: Secondary | ICD-10-CM | POA: Diagnosis not present

## 2018-05-12 DIAGNOSIS — M6281 Muscle weakness (generalized): Secondary | ICD-10-CM | POA: Diagnosis not present

## 2018-05-12 DIAGNOSIS — I63532 Cerebral infarction due to unspecified occlusion or stenosis of left posterior cerebral artery: Secondary | ICD-10-CM | POA: Diagnosis not present

## 2018-06-01 DIAGNOSIS — E119 Type 2 diabetes mellitus without complications: Secondary | ICD-10-CM | POA: Diagnosis not present

## 2018-06-01 DIAGNOSIS — Z0000 Encounter for general adult medical examination without abnormal findings: Secondary | ICD-10-CM | POA: Diagnosis not present

## 2018-06-03 DIAGNOSIS — H353221 Exudative age-related macular degeneration, left eye, with active choroidal neovascularization: Secondary | ICD-10-CM | POA: Diagnosis not present

## 2018-06-03 DIAGNOSIS — H353112 Nonexudative age-related macular degeneration, right eye, intermediate dry stage: Secondary | ICD-10-CM | POA: Diagnosis not present

## 2018-06-03 DIAGNOSIS — H3562 Retinal hemorrhage, left eye: Secondary | ICD-10-CM | POA: Diagnosis not present

## 2018-06-03 DIAGNOSIS — H35363 Drusen (degenerative) of macula, bilateral: Secondary | ICD-10-CM | POA: Diagnosis not present

## 2018-06-09 DIAGNOSIS — R54 Age-related physical debility: Secondary | ICD-10-CM | POA: Diagnosis not present

## 2018-06-09 DIAGNOSIS — I447 Left bundle-branch block, unspecified: Secondary | ICD-10-CM | POA: Diagnosis not present

## 2018-06-09 DIAGNOSIS — I6930 Unspecified sequelae of cerebral infarction: Secondary | ICD-10-CM | POA: Diagnosis not present

## 2018-06-09 DIAGNOSIS — I10 Essential (primary) hypertension: Secondary | ICD-10-CM | POA: Diagnosis not present

## 2018-06-17 DIAGNOSIS — H353221 Exudative age-related macular degeneration, left eye, with active choroidal neovascularization: Secondary | ICD-10-CM | POA: Diagnosis not present

## 2018-06-29 ENCOUNTER — Encounter: Payer: Self-pay | Admitting: Podiatry

## 2018-06-29 ENCOUNTER — Ambulatory Visit (INDEPENDENT_AMBULATORY_CARE_PROVIDER_SITE_OTHER): Payer: Medicare Other | Admitting: Podiatry

## 2018-06-29 DIAGNOSIS — B351 Tinea unguium: Secondary | ICD-10-CM | POA: Diagnosis not present

## 2018-06-29 DIAGNOSIS — E1142 Type 2 diabetes mellitus with diabetic polyneuropathy: Secondary | ICD-10-CM

## 2018-06-29 DIAGNOSIS — M79676 Pain in unspecified toe(s): Secondary | ICD-10-CM

## 2018-06-29 NOTE — Progress Notes (Signed)
Complaint:  Visit Type: Patient returns to my office for continued preventative foot care services. Complaint: Patient states" my nails have grown long and thick and become painful to walk and wear shoes" Patient has been diagnosed with DM with no foot complications. The patient presents for preventative foot care services. No changes to ROS  Podiatric Exam: Vascular: dorsalis pedis and posterior tibial pulses are weakly  palpable bilateral. Capillary return is immediate. Temperature gradient is WNL. Skin turgor WNL  Sensorium: Diminished  Semmes Weinstein monofilament test. Normal tactile sensation bilaterally. Nail Exam: Pt has thick disfigured discolored nails with subungual debris noted hallux nails  B/L and second right. Ulcer Exam: There is no evidence of ulcer or pre-ulcerative changes or infection. Orthopedic Exam: Muscle tone and strength are WNL. No limitations in general ROM. No crepitus or effusions noted. Foot type and digits show no abnormalities. Bony prominences are unremarkable. Skin: No Porokeratosis. No infection or ulcers  Diagnosis:  Onychomycosis, , Pain in right toe, pain in left toes  Treatment & Plan Procedures and Treatment: Consent by patient was obtained for treatment procedures.   Debridement of mycotic and hypertrophic toenails, 1 through 5 bilateral and clearing of subungual debris. No ulceration, no infection noted.  Return Visit-Office Procedure: Patient instructed to return to the office for a follow up visit 3 months for continued evaluation and treatment.    Tahji  DPM 

## 2018-07-07 DIAGNOSIS — E118 Type 2 diabetes mellitus with unspecified complications: Secondary | ICD-10-CM | POA: Diagnosis not present

## 2018-07-07 DIAGNOSIS — I6930 Unspecified sequelae of cerebral infarction: Secondary | ICD-10-CM | POA: Diagnosis not present

## 2018-07-07 DIAGNOSIS — R54 Age-related physical debility: Secondary | ICD-10-CM | POA: Diagnosis not present

## 2018-07-07 DIAGNOSIS — I10 Essential (primary) hypertension: Secondary | ICD-10-CM | POA: Diagnosis not present

## 2018-07-15 DIAGNOSIS — G4700 Insomnia, unspecified: Secondary | ICD-10-CM | POA: Diagnosis not present

## 2018-07-25 DIAGNOSIS — H353221 Exudative age-related macular degeneration, left eye, with active choroidal neovascularization: Secondary | ICD-10-CM | POA: Diagnosis not present

## 2018-08-05 DIAGNOSIS — K089 Disorder of teeth and supporting structures, unspecified: Secondary | ICD-10-CM | POA: Diagnosis not present

## 2018-08-05 DIAGNOSIS — H9190 Unspecified hearing loss, unspecified ear: Secondary | ICD-10-CM | POA: Diagnosis not present

## 2018-08-05 DIAGNOSIS — Z7409 Other reduced mobility: Secondary | ICD-10-CM | POA: Diagnosis not present

## 2018-08-05 DIAGNOSIS — R54 Age-related physical debility: Secondary | ICD-10-CM | POA: Diagnosis not present

## 2018-08-28 DIAGNOSIS — I10 Essential (primary) hypertension: Secondary | ICD-10-CM | POA: Diagnosis not present

## 2018-08-28 DIAGNOSIS — M6281 Muscle weakness (generalized): Secondary | ICD-10-CM | POA: Diagnosis not present

## 2018-08-28 DIAGNOSIS — R2681 Unsteadiness on feet: Secondary | ICD-10-CM | POA: Diagnosis not present

## 2018-08-28 DIAGNOSIS — R2689 Other abnormalities of gait and mobility: Secondary | ICD-10-CM | POA: Diagnosis not present

## 2018-08-29 DIAGNOSIS — H353221 Exudative age-related macular degeneration, left eye, with active choroidal neovascularization: Secondary | ICD-10-CM | POA: Diagnosis not present

## 2018-08-29 DIAGNOSIS — R42 Dizziness and giddiness: Secondary | ICD-10-CM | POA: Diagnosis not present

## 2018-08-30 DIAGNOSIS — M6281 Muscle weakness (generalized): Secondary | ICD-10-CM | POA: Diagnosis not present

## 2018-08-30 DIAGNOSIS — R2681 Unsteadiness on feet: Secondary | ICD-10-CM | POA: Diagnosis not present

## 2018-08-30 DIAGNOSIS — R2689 Other abnormalities of gait and mobility: Secondary | ICD-10-CM | POA: Diagnosis not present

## 2018-08-30 DIAGNOSIS — I10 Essential (primary) hypertension: Secondary | ICD-10-CM | POA: Diagnosis not present

## 2018-08-31 DIAGNOSIS — M6281 Muscle weakness (generalized): Secondary | ICD-10-CM | POA: Diagnosis not present

## 2018-08-31 DIAGNOSIS — R2681 Unsteadiness on feet: Secondary | ICD-10-CM | POA: Diagnosis not present

## 2018-08-31 DIAGNOSIS — I10 Essential (primary) hypertension: Secondary | ICD-10-CM | POA: Diagnosis not present

## 2018-08-31 DIAGNOSIS — R54 Age-related physical debility: Secondary | ICD-10-CM | POA: Diagnosis not present

## 2018-08-31 DIAGNOSIS — R2689 Other abnormalities of gait and mobility: Secondary | ICD-10-CM | POA: Diagnosis not present

## 2018-09-01 DIAGNOSIS — R2681 Unsteadiness on feet: Secondary | ICD-10-CM | POA: Diagnosis not present

## 2018-09-01 DIAGNOSIS — M6281 Muscle weakness (generalized): Secondary | ICD-10-CM | POA: Diagnosis not present

## 2018-09-01 DIAGNOSIS — R2689 Other abnormalities of gait and mobility: Secondary | ICD-10-CM | POA: Diagnosis not present

## 2018-09-01 DIAGNOSIS — I10 Essential (primary) hypertension: Secondary | ICD-10-CM | POA: Diagnosis not present

## 2018-09-02 DIAGNOSIS — R2681 Unsteadiness on feet: Secondary | ICD-10-CM | POA: Diagnosis not present

## 2018-09-02 DIAGNOSIS — M6281 Muscle weakness (generalized): Secondary | ICD-10-CM | POA: Diagnosis not present

## 2018-09-02 DIAGNOSIS — R2689 Other abnormalities of gait and mobility: Secondary | ICD-10-CM | POA: Diagnosis not present

## 2018-09-02 DIAGNOSIS — I10 Essential (primary) hypertension: Secondary | ICD-10-CM | POA: Diagnosis not present

## 2018-09-05 DIAGNOSIS — I10 Essential (primary) hypertension: Secondary | ICD-10-CM | POA: Diagnosis not present

## 2018-09-05 DIAGNOSIS — M6281 Muscle weakness (generalized): Secondary | ICD-10-CM | POA: Diagnosis not present

## 2018-09-05 DIAGNOSIS — R2681 Unsteadiness on feet: Secondary | ICD-10-CM | POA: Diagnosis not present

## 2018-09-05 DIAGNOSIS — R2689 Other abnormalities of gait and mobility: Secondary | ICD-10-CM | POA: Diagnosis not present

## 2018-09-06 DIAGNOSIS — M6281 Muscle weakness (generalized): Secondary | ICD-10-CM | POA: Diagnosis not present

## 2018-09-06 DIAGNOSIS — R2681 Unsteadiness on feet: Secondary | ICD-10-CM | POA: Diagnosis not present

## 2018-09-06 DIAGNOSIS — I10 Essential (primary) hypertension: Secondary | ICD-10-CM | POA: Diagnosis not present

## 2018-09-06 DIAGNOSIS — R2689 Other abnormalities of gait and mobility: Secondary | ICD-10-CM | POA: Diagnosis not present

## 2018-09-07 DIAGNOSIS — M6281 Muscle weakness (generalized): Secondary | ICD-10-CM | POA: Diagnosis not present

## 2018-09-07 DIAGNOSIS — R2681 Unsteadiness on feet: Secondary | ICD-10-CM | POA: Diagnosis not present

## 2018-09-07 DIAGNOSIS — R2689 Other abnormalities of gait and mobility: Secondary | ICD-10-CM | POA: Diagnosis not present

## 2018-09-07 DIAGNOSIS — I10 Essential (primary) hypertension: Secondary | ICD-10-CM | POA: Diagnosis not present

## 2018-09-08 ENCOUNTER — Other Ambulatory Visit: Payer: Self-pay | Admitting: Geriatric Medicine

## 2018-09-08 DIAGNOSIS — R2681 Unsteadiness on feet: Secondary | ICD-10-CM | POA: Diagnosis not present

## 2018-09-08 DIAGNOSIS — R2689 Other abnormalities of gait and mobility: Secondary | ICD-10-CM | POA: Diagnosis not present

## 2018-09-08 DIAGNOSIS — R42 Dizziness and giddiness: Secondary | ICD-10-CM

## 2018-09-08 DIAGNOSIS — G4700 Insomnia, unspecified: Secondary | ICD-10-CM | POA: Diagnosis not present

## 2018-09-08 DIAGNOSIS — M6281 Muscle weakness (generalized): Secondary | ICD-10-CM | POA: Diagnosis not present

## 2018-09-08 DIAGNOSIS — I10 Essential (primary) hypertension: Secondary | ICD-10-CM | POA: Diagnosis not present

## 2018-09-09 DIAGNOSIS — R2681 Unsteadiness on feet: Secondary | ICD-10-CM | POA: Diagnosis not present

## 2018-09-09 DIAGNOSIS — R2689 Other abnormalities of gait and mobility: Secondary | ICD-10-CM | POA: Diagnosis not present

## 2018-09-09 DIAGNOSIS — M6281 Muscle weakness (generalized): Secondary | ICD-10-CM | POA: Diagnosis not present

## 2018-09-09 DIAGNOSIS — I10 Essential (primary) hypertension: Secondary | ICD-10-CM | POA: Diagnosis not present

## 2018-09-12 DIAGNOSIS — I10 Essential (primary) hypertension: Secondary | ICD-10-CM | POA: Diagnosis not present

## 2018-09-12 DIAGNOSIS — M6281 Muscle weakness (generalized): Secondary | ICD-10-CM | POA: Diagnosis not present

## 2018-09-12 DIAGNOSIS — R2681 Unsteadiness on feet: Secondary | ICD-10-CM | POA: Diagnosis not present

## 2018-09-12 DIAGNOSIS — R2689 Other abnormalities of gait and mobility: Secondary | ICD-10-CM | POA: Diagnosis not present

## 2018-09-13 ENCOUNTER — Other Ambulatory Visit: Payer: Self-pay | Admitting: Geriatric Medicine

## 2018-09-13 DIAGNOSIS — R2689 Other abnormalities of gait and mobility: Secondary | ICD-10-CM | POA: Diagnosis not present

## 2018-09-13 DIAGNOSIS — M6281 Muscle weakness (generalized): Secondary | ICD-10-CM | POA: Diagnosis not present

## 2018-09-13 DIAGNOSIS — I10 Essential (primary) hypertension: Secondary | ICD-10-CM | POA: Diagnosis not present

## 2018-09-13 DIAGNOSIS — R2681 Unsteadiness on feet: Secondary | ICD-10-CM | POA: Diagnosis not present

## 2018-09-14 ENCOUNTER — Other Ambulatory Visit: Payer: Self-pay | Admitting: Geriatric Medicine

## 2018-09-14 DIAGNOSIS — R2681 Unsteadiness on feet: Secondary | ICD-10-CM | POA: Diagnosis not present

## 2018-09-14 DIAGNOSIS — I10 Essential (primary) hypertension: Secondary | ICD-10-CM | POA: Diagnosis not present

## 2018-09-14 DIAGNOSIS — M6281 Muscle weakness (generalized): Secondary | ICD-10-CM | POA: Diagnosis not present

## 2018-09-14 DIAGNOSIS — R2689 Other abnormalities of gait and mobility: Secondary | ICD-10-CM | POA: Diagnosis not present

## 2018-09-15 ENCOUNTER — Ambulatory Visit
Admission: RE | Admit: 2018-09-15 | Discharge: 2018-09-15 | Disposition: A | Discharge status: Home / Self Care | Payer: Medicare Other | Source: Ambulatory Visit | Attending: Geriatric Medicine | Admitting: Geriatric Medicine

## 2018-09-15 DIAGNOSIS — M6281 Muscle weakness (generalized): Secondary | ICD-10-CM | POA: Diagnosis not present

## 2018-09-15 DIAGNOSIS — R2689 Other abnormalities of gait and mobility: Secondary | ICD-10-CM | POA: Diagnosis not present

## 2018-09-15 DIAGNOSIS — R2681 Unsteadiness on feet: Secondary | ICD-10-CM | POA: Diagnosis not present

## 2018-09-15 DIAGNOSIS — R42 Dizziness and giddiness: Secondary | ICD-10-CM | POA: Diagnosis not present

## 2018-09-15 DIAGNOSIS — I10 Essential (primary) hypertension: Secondary | ICD-10-CM | POA: Diagnosis not present

## 2018-09-16 DIAGNOSIS — R2681 Unsteadiness on feet: Secondary | ICD-10-CM | POA: Diagnosis not present

## 2018-09-16 DIAGNOSIS — I10 Essential (primary) hypertension: Secondary | ICD-10-CM | POA: Diagnosis not present

## 2018-09-16 DIAGNOSIS — L218 Other seborrheic dermatitis: Secondary | ICD-10-CM | POA: Diagnosis not present

## 2018-09-16 DIAGNOSIS — R2689 Other abnormalities of gait and mobility: Secondary | ICD-10-CM | POA: Diagnosis not present

## 2018-09-16 DIAGNOSIS — M6281 Muscle weakness (generalized): Secondary | ICD-10-CM | POA: Diagnosis not present

## 2018-09-19 DIAGNOSIS — I10 Essential (primary) hypertension: Secondary | ICD-10-CM | POA: Diagnosis not present

## 2018-09-19 DIAGNOSIS — M6281 Muscle weakness (generalized): Secondary | ICD-10-CM | POA: Diagnosis not present

## 2018-09-19 DIAGNOSIS — R2689 Other abnormalities of gait and mobility: Secondary | ICD-10-CM | POA: Diagnosis not present

## 2018-09-19 DIAGNOSIS — R2681 Unsteadiness on feet: Secondary | ICD-10-CM | POA: Diagnosis not present

## 2018-09-20 DIAGNOSIS — R54 Age-related physical debility: Secondary | ICD-10-CM | POA: Diagnosis not present

## 2018-09-20 DIAGNOSIS — Z7409 Other reduced mobility: Secondary | ICD-10-CM | POA: Diagnosis not present

## 2018-09-20 DIAGNOSIS — R2681 Unsteadiness on feet: Secondary | ICD-10-CM | POA: Diagnosis not present

## 2018-09-20 DIAGNOSIS — I10 Essential (primary) hypertension: Secondary | ICD-10-CM | POA: Diagnosis not present

## 2018-09-20 DIAGNOSIS — M6281 Muscle weakness (generalized): Secondary | ICD-10-CM | POA: Diagnosis not present

## 2018-09-20 DIAGNOSIS — I6930 Unspecified sequelae of cerebral infarction: Secondary | ICD-10-CM | POA: Diagnosis not present

## 2018-09-20 DIAGNOSIS — R2689 Other abnormalities of gait and mobility: Secondary | ICD-10-CM | POA: Diagnosis not present

## 2018-09-21 DIAGNOSIS — G4701 Insomnia due to medical condition: Secondary | ICD-10-CM | POA: Diagnosis not present

## 2018-09-21 DIAGNOSIS — R2689 Other abnormalities of gait and mobility: Secondary | ICD-10-CM | POA: Diagnosis not present

## 2018-09-21 DIAGNOSIS — F0150 Vascular dementia without behavioral disturbance: Secondary | ICD-10-CM | POA: Diagnosis not present

## 2018-09-21 DIAGNOSIS — M6281 Muscle weakness (generalized): Secondary | ICD-10-CM | POA: Diagnosis not present

## 2018-09-21 DIAGNOSIS — I10 Essential (primary) hypertension: Secondary | ICD-10-CM | POA: Diagnosis not present

## 2018-09-21 DIAGNOSIS — R2681 Unsteadiness on feet: Secondary | ICD-10-CM | POA: Diagnosis not present

## 2018-09-22 DIAGNOSIS — R2681 Unsteadiness on feet: Secondary | ICD-10-CM | POA: Diagnosis not present

## 2018-09-22 DIAGNOSIS — M6281 Muscle weakness (generalized): Secondary | ICD-10-CM | POA: Diagnosis not present

## 2018-09-22 DIAGNOSIS — I10 Essential (primary) hypertension: Secondary | ICD-10-CM | POA: Diagnosis not present

## 2018-09-22 DIAGNOSIS — R2689 Other abnormalities of gait and mobility: Secondary | ICD-10-CM | POA: Diagnosis not present

## 2018-09-23 DIAGNOSIS — R2681 Unsteadiness on feet: Secondary | ICD-10-CM | POA: Diagnosis not present

## 2018-09-23 DIAGNOSIS — M6281 Muscle weakness (generalized): Secondary | ICD-10-CM | POA: Diagnosis not present

## 2018-09-23 DIAGNOSIS — R2689 Other abnormalities of gait and mobility: Secondary | ICD-10-CM | POA: Diagnosis not present

## 2018-09-23 DIAGNOSIS — I10 Essential (primary) hypertension: Secondary | ICD-10-CM | POA: Diagnosis not present

## 2018-09-26 DIAGNOSIS — I10 Essential (primary) hypertension: Secondary | ICD-10-CM | POA: Diagnosis not present

## 2018-09-26 DIAGNOSIS — R2681 Unsteadiness on feet: Secondary | ICD-10-CM | POA: Diagnosis not present

## 2018-09-26 DIAGNOSIS — R2689 Other abnormalities of gait and mobility: Secondary | ICD-10-CM | POA: Diagnosis not present

## 2018-09-26 DIAGNOSIS — M6281 Muscle weakness (generalized): Secondary | ICD-10-CM | POA: Diagnosis not present

## 2018-09-28 ENCOUNTER — Encounter: Payer: Self-pay | Admitting: Podiatry

## 2018-09-28 ENCOUNTER — Other Ambulatory Visit: Payer: Self-pay

## 2018-09-28 ENCOUNTER — Ambulatory Visit (INDEPENDENT_AMBULATORY_CARE_PROVIDER_SITE_OTHER): Payer: Medicare Other | Admitting: Podiatry

## 2018-09-28 VITALS — Temp 97.5°F

## 2018-09-28 DIAGNOSIS — M79676 Pain in unspecified toe(s): Secondary | ICD-10-CM | POA: Diagnosis not present

## 2018-09-28 DIAGNOSIS — I10 Essential (primary) hypertension: Secondary | ICD-10-CM | POA: Diagnosis not present

## 2018-09-28 DIAGNOSIS — R2689 Other abnormalities of gait and mobility: Secondary | ICD-10-CM | POA: Diagnosis not present

## 2018-09-28 DIAGNOSIS — R2681 Unsteadiness on feet: Secondary | ICD-10-CM | POA: Diagnosis not present

## 2018-09-28 DIAGNOSIS — M6281 Muscle weakness (generalized): Secondary | ICD-10-CM | POA: Diagnosis not present

## 2018-09-28 DIAGNOSIS — B351 Tinea unguium: Secondary | ICD-10-CM | POA: Diagnosis not present

## 2018-09-28 DIAGNOSIS — E1142 Type 2 diabetes mellitus with diabetic polyneuropathy: Secondary | ICD-10-CM

## 2018-09-28 NOTE — Progress Notes (Signed)
Complaint:  Visit Type: Patient returns to my office for continued preventative foot care services. Complaint: Patient states" my nails have grown long and thick and become painful to walk and wear shoes" Patient has been diagnosed with DM with no foot complications. The patient presents for preventative foot care services. No changes to ROS  Podiatric Exam: Vascular: dorsalis pedis and posterior tibial pulses are weakly  palpable bilateral. Capillary return is immediate. Temperature gradient is WNL. Skin turgor WNL  Sensorium: Diminished  Semmes Weinstein monofilament test. Normal tactile sensation bilaterally. Nail Exam: Pt has thick disfigured discolored nails with subungual debris noted hallux nails  B/L and second right. Ulcer Exam: There is no evidence of ulcer or pre-ulcerative changes or infection. Orthopedic Exam: Muscle tone and strength are WNL. No limitations in general ROM. No crepitus or effusions noted. Foot type and digits show no abnormalities. Bony prominences are unremarkable. Skin: No Porokeratosis. No infection or ulcers  Diagnosis:  Onychomycosis, , Pain in right toe, pain in left toes  Treatment & Plan Procedures and Treatment: Consent by patient was obtained for treatment procedures.   Debridement of mycotic and hypertrophic toenails, 1 through 5 bilateral and clearing of subungual debris. No ulceration, no infection noted.  Return Visit-Office Procedure: Patient instructed to return to the office for a follow up visit 3 months for continued evaluation and treatment.    Charlott Calvario DPM 

## 2018-09-29 DIAGNOSIS — E119 Type 2 diabetes mellitus without complications: Secondary | ICD-10-CM | POA: Diagnosis not present

## 2018-10-03 DIAGNOSIS — H353221 Exudative age-related macular degeneration, left eye, with active choroidal neovascularization: Secondary | ICD-10-CM | POA: Diagnosis not present

## 2018-10-19 DIAGNOSIS — F0150 Vascular dementia without behavioral disturbance: Secondary | ICD-10-CM | POA: Diagnosis not present

## 2018-10-19 DIAGNOSIS — G4701 Insomnia due to medical condition: Secondary | ICD-10-CM | POA: Diagnosis not present

## 2018-11-07 DIAGNOSIS — H353221 Exudative age-related macular degeneration, left eye, with active choroidal neovascularization: Secondary | ICD-10-CM | POA: Diagnosis not present

## 2018-11-11 DIAGNOSIS — I10 Essential (primary) hypertension: Secondary | ICD-10-CM | POA: Diagnosis not present

## 2018-11-11 DIAGNOSIS — Z7409 Other reduced mobility: Secondary | ICD-10-CM | POA: Diagnosis not present

## 2018-11-11 DIAGNOSIS — R54 Age-related physical debility: Secondary | ICD-10-CM | POA: Diagnosis not present

## 2018-11-11 DIAGNOSIS — I6930 Unspecified sequelae of cerebral infarction: Secondary | ICD-10-CM | POA: Diagnosis not present

## 2018-11-14 DIAGNOSIS — E119 Type 2 diabetes mellitus without complications: Secondary | ICD-10-CM | POA: Diagnosis not present

## 2018-11-17 DIAGNOSIS — I6930 Unspecified sequelae of cerebral infarction: Secondary | ICD-10-CM | POA: Diagnosis not present

## 2018-11-17 DIAGNOSIS — R54 Age-related physical debility: Secondary | ICD-10-CM | POA: Diagnosis not present

## 2018-11-17 DIAGNOSIS — I10 Essential (primary) hypertension: Secondary | ICD-10-CM | POA: Diagnosis not present

## 2018-11-17 DIAGNOSIS — E7849 Other hyperlipidemia: Secondary | ICD-10-CM | POA: Diagnosis not present

## 2018-12-01 DIAGNOSIS — R319 Hematuria, unspecified: Secondary | ICD-10-CM | POA: Diagnosis not present

## 2018-12-01 DIAGNOSIS — N390 Urinary tract infection, site not specified: Secondary | ICD-10-CM | POA: Diagnosis not present

## 2018-12-12 DIAGNOSIS — H353221 Exudative age-related macular degeneration, left eye, with active choroidal neovascularization: Secondary | ICD-10-CM | POA: Diagnosis not present

## 2018-12-21 DIAGNOSIS — J309 Allergic rhinitis, unspecified: Secondary | ICD-10-CM | POA: Diagnosis not present

## 2018-12-21 DIAGNOSIS — K5904 Chronic idiopathic constipation: Secondary | ICD-10-CM | POA: Diagnosis not present

## 2018-12-21 DIAGNOSIS — E559 Vitamin D deficiency, unspecified: Secondary | ICD-10-CM | POA: Diagnosis not present

## 2018-12-21 DIAGNOSIS — H04123 Dry eye syndrome of bilateral lacrimal glands: Secondary | ICD-10-CM | POA: Diagnosis not present

## 2018-12-29 DIAGNOSIS — Z1159 Encounter for screening for other viral diseases: Secondary | ICD-10-CM | POA: Diagnosis not present

## 2019-01-05 DIAGNOSIS — E11649 Type 2 diabetes mellitus with hypoglycemia without coma: Secondary | ICD-10-CM | POA: Diagnosis not present

## 2019-01-05 DIAGNOSIS — R5383 Other fatigue: Secondary | ICD-10-CM | POA: Diagnosis not present

## 2019-01-09 DIAGNOSIS — E7849 Other hyperlipidemia: Secondary | ICD-10-CM | POA: Diagnosis not present

## 2019-01-09 DIAGNOSIS — I69398 Other sequelae of cerebral infarction: Secondary | ICD-10-CM | POA: Diagnosis not present

## 2019-01-09 DIAGNOSIS — R54 Age-related physical debility: Secondary | ICD-10-CM | POA: Diagnosis not present

## 2019-01-09 DIAGNOSIS — I10 Essential (primary) hypertension: Secondary | ICD-10-CM | POA: Diagnosis not present

## 2019-01-11 ENCOUNTER — Encounter: Payer: Self-pay | Admitting: Podiatry

## 2019-01-11 ENCOUNTER — Other Ambulatory Visit: Payer: Self-pay

## 2019-01-11 ENCOUNTER — Ambulatory Visit (INDEPENDENT_AMBULATORY_CARE_PROVIDER_SITE_OTHER): Payer: Medicare Other | Admitting: Podiatry

## 2019-01-11 DIAGNOSIS — B351 Tinea unguium: Secondary | ICD-10-CM

## 2019-01-11 DIAGNOSIS — E1142 Type 2 diabetes mellitus with diabetic polyneuropathy: Secondary | ICD-10-CM

## 2019-01-11 DIAGNOSIS — M79676 Pain in unspecified toe(s): Secondary | ICD-10-CM

## 2019-01-11 NOTE — Progress Notes (Signed)
Complaint:  Visit Type: Patient returns to my office for continued preventative foot care services. Complaint: Patient states" my nails have grown long and thick and become painful to walk and wear shoes" Patient has been diagnosed with DM with no foot complications. The patient presents for preventative foot care services. No changes to ROS  Podiatric Exam: Vascular: dorsalis pedis and posterior tibial pulses are weakly  palpable bilateral. Capillary return is immediate. Temperature gradient is WNL. Skin turgor WNL  Sensorium: Diminished  Semmes Weinstein monofilament test. Normal tactile sensation bilaterally. Nail Exam: Pt has thick disfigured discolored nails with subungual debris noted hallux nails  B/L and second right. Ulcer Exam: There is no evidence of ulcer or pre-ulcerative changes or infection. Orthopedic Exam: Muscle tone and strength are WNL. No limitations in general ROM. No crepitus or effusions noted. Foot type and digits show no abnormalities. Bony prominences are unremarkable. Skin: No Porokeratosis. No infection or ulcers  Diagnosis:  Onychomycosis, , Pain in right toe, pain in left toes  Treatment & Plan Procedures and Treatment: Consent by patient was obtained for treatment procedures.   Debridement of mycotic and hypertrophic toenails, 1 through 5 bilateral and clearing of subungual debris. No ulceration, no infection noted.  Return Visit-Office Procedure: Patient instructed to return to the office for a follow up visit 3 months for continued evaluation and treatment.    Gardiner Barefoot DPM

## 2019-01-12 DIAGNOSIS — Z03818 Encounter for observation for suspected exposure to other biological agents ruled out: Secondary | ICD-10-CM | POA: Diagnosis not present

## 2019-01-16 DIAGNOSIS — R05 Cough: Secondary | ICD-10-CM | POA: Diagnosis not present

## 2019-01-16 DIAGNOSIS — H353221 Exudative age-related macular degeneration, left eye, with active choroidal neovascularization: Secondary | ICD-10-CM | POA: Diagnosis not present

## 2019-01-23 DIAGNOSIS — F329 Major depressive disorder, single episode, unspecified: Secondary | ICD-10-CM | POA: Diagnosis not present

## 2019-01-25 DIAGNOSIS — I10 Essential (primary) hypertension: Secondary | ICD-10-CM | POA: Diagnosis not present

## 2019-01-25 DIAGNOSIS — F0150 Vascular dementia without behavioral disturbance: Secondary | ICD-10-CM | POA: Diagnosis not present

## 2019-01-25 DIAGNOSIS — I69398 Other sequelae of cerebral infarction: Secondary | ICD-10-CM | POA: Diagnosis not present

## 2019-01-25 DIAGNOSIS — R54 Age-related physical debility: Secondary | ICD-10-CM | POA: Diagnosis not present

## 2019-01-25 DIAGNOSIS — F339 Major depressive disorder, recurrent, unspecified: Secondary | ICD-10-CM | POA: Diagnosis not present

## 2019-01-25 DIAGNOSIS — F329 Major depressive disorder, single episode, unspecified: Secondary | ICD-10-CM | POA: Diagnosis not present

## 2019-01-26 DIAGNOSIS — E119 Type 2 diabetes mellitus without complications: Secondary | ICD-10-CM | POA: Diagnosis not present

## 2019-01-26 DIAGNOSIS — N390 Urinary tract infection, site not specified: Secondary | ICD-10-CM | POA: Diagnosis not present

## 2019-01-26 DIAGNOSIS — D649 Anemia, unspecified: Secondary | ICD-10-CM | POA: Diagnosis not present

## 2019-01-26 DIAGNOSIS — E785 Hyperlipidemia, unspecified: Secondary | ICD-10-CM | POA: Diagnosis not present

## 2019-02-14 DIAGNOSIS — Z1159 Encounter for screening for other viral diseases: Secondary | ICD-10-CM | POA: Diagnosis not present

## 2019-02-20 DIAGNOSIS — H353221 Exudative age-related macular degeneration, left eye, with active choroidal neovascularization: Secondary | ICD-10-CM | POA: Diagnosis not present

## 2019-02-23 DIAGNOSIS — F0390 Unspecified dementia without behavioral disturbance: Secondary | ICD-10-CM | POA: Diagnosis not present

## 2019-03-01 DIAGNOSIS — R05 Cough: Secondary | ICD-10-CM | POA: Diagnosis not present

## 2019-03-02 DIAGNOSIS — F5105 Insomnia due to other mental disorder: Secondary | ICD-10-CM | POA: Diagnosis not present

## 2019-03-02 DIAGNOSIS — F330 Major depressive disorder, recurrent, mild: Secondary | ICD-10-CM | POA: Diagnosis not present

## 2019-03-02 DIAGNOSIS — F0150 Vascular dementia without behavioral disturbance: Secondary | ICD-10-CM | POA: Diagnosis not present

## 2019-03-04 DIAGNOSIS — M1612 Unilateral primary osteoarthritis, left hip: Secondary | ICD-10-CM | POA: Diagnosis not present

## 2019-03-06 DIAGNOSIS — N390 Urinary tract infection, site not specified: Secondary | ICD-10-CM | POA: Diagnosis not present

## 2019-03-06 DIAGNOSIS — F5105 Insomnia due to other mental disorder: Secondary | ICD-10-CM | POA: Diagnosis not present

## 2019-03-06 DIAGNOSIS — F0150 Vascular dementia without behavioral disturbance: Secondary | ICD-10-CM | POA: Diagnosis not present

## 2019-03-06 DIAGNOSIS — R319 Hematuria, unspecified: Secondary | ICD-10-CM | POA: Diagnosis not present

## 2019-03-06 DIAGNOSIS — F330 Major depressive disorder, recurrent, mild: Secondary | ICD-10-CM | POA: Diagnosis not present

## 2019-03-06 DIAGNOSIS — F0390 Unspecified dementia without behavioral disturbance: Secondary | ICD-10-CM | POA: Diagnosis not present

## 2019-03-07 DIAGNOSIS — Z03818 Encounter for observation for suspected exposure to other biological agents ruled out: Secondary | ICD-10-CM | POA: Diagnosis not present

## 2019-03-16 DIAGNOSIS — E118 Type 2 diabetes mellitus with unspecified complications: Secondary | ICD-10-CM | POA: Diagnosis not present

## 2019-03-16 DIAGNOSIS — I69398 Other sequelae of cerebral infarction: Secondary | ICD-10-CM | POA: Diagnosis not present

## 2019-03-16 DIAGNOSIS — R54 Age-related physical debility: Secondary | ICD-10-CM | POA: Diagnosis not present

## 2019-03-16 DIAGNOSIS — J309 Allergic rhinitis, unspecified: Secondary | ICD-10-CM | POA: Diagnosis not present

## 2019-03-18 DIAGNOSIS — Z03818 Encounter for observation for suspected exposure to other biological agents ruled out: Secondary | ICD-10-CM | POA: Diagnosis not present

## 2019-03-28 DIAGNOSIS — H353221 Exudative age-related macular degeneration, left eye, with active choroidal neovascularization: Secondary | ICD-10-CM | POA: Diagnosis not present

## 2019-03-28 DIAGNOSIS — Z1159 Encounter for screening for other viral diseases: Secondary | ICD-10-CM | POA: Diagnosis not present

## 2019-03-29 DIAGNOSIS — F0150 Vascular dementia without behavioral disturbance: Secondary | ICD-10-CM | POA: Diagnosis not present

## 2019-03-29 DIAGNOSIS — F330 Major depressive disorder, recurrent, mild: Secondary | ICD-10-CM | POA: Diagnosis not present

## 2019-03-29 DIAGNOSIS — F5105 Insomnia due to other mental disorder: Secondary | ICD-10-CM | POA: Diagnosis not present

## 2019-04-05 DIAGNOSIS — Z03818 Encounter for observation for suspected exposure to other biological agents ruled out: Secondary | ICD-10-CM | POA: Diagnosis not present

## 2019-04-10 DIAGNOSIS — I10 Essential (primary) hypertension: Secondary | ICD-10-CM | POA: Diagnosis not present

## 2019-04-10 DIAGNOSIS — E119 Type 2 diabetes mellitus without complications: Secondary | ICD-10-CM | POA: Diagnosis not present

## 2019-04-12 ENCOUNTER — Ambulatory Visit (INDEPENDENT_AMBULATORY_CARE_PROVIDER_SITE_OTHER): Payer: Medicare Other | Admitting: Podiatry

## 2019-04-12 ENCOUNTER — Encounter: Payer: Self-pay | Admitting: Podiatry

## 2019-04-12 ENCOUNTER — Other Ambulatory Visit: Payer: Self-pay

## 2019-04-12 DIAGNOSIS — J309 Allergic rhinitis, unspecified: Secondary | ICD-10-CM | POA: Diagnosis not present

## 2019-04-12 DIAGNOSIS — M79676 Pain in unspecified toe(s): Secondary | ICD-10-CM

## 2019-04-12 DIAGNOSIS — E1142 Type 2 diabetes mellitus with diabetic polyneuropathy: Secondary | ICD-10-CM

## 2019-04-12 DIAGNOSIS — I69398 Other sequelae of cerebral infarction: Secondary | ICD-10-CM | POA: Diagnosis not present

## 2019-04-12 DIAGNOSIS — R54 Age-related physical debility: Secondary | ICD-10-CM | POA: Diagnosis not present

## 2019-04-12 DIAGNOSIS — B351 Tinea unguium: Secondary | ICD-10-CM | POA: Diagnosis not present

## 2019-04-12 DIAGNOSIS — E118 Type 2 diabetes mellitus with unspecified complications: Secondary | ICD-10-CM | POA: Diagnosis not present

## 2019-04-12 NOTE — Progress Notes (Signed)
Complaint:  Visit Type: Patient returns to my office for continued preventative foot care services. Complaint: Patient states" my nails have grown long and thick and become painful to walk and wear shoes" Patient has been diagnosed with DM with no foot complications. The patient presents for preventative foot care services. No changes to ROS  Podiatric Exam: Vascular: dorsalis pedis and posterior tibial pulses are weakly  palpable bilateral. Capillary return is immediate. Temperature gradient is WNL. Skin turgor WNL  Sensorium: Diminished  Semmes Weinstein monofilament test. Normal tactile sensation bilaterally. Nail Exam: Pt has thick disfigured discolored nails with subungual debris noted hallux nails  B/L and second right. Ulcer Exam: There is no evidence of ulcer or pre-ulcerative changes or infection. Orthopedic Exam: Muscle tone and strength are WNL. No limitations in general ROM. No crepitus or effusions noted. Foot type and digits show no abnormalities. Bony prominences are unremarkable. Skin: No Porokeratosis. No infection or ulcers  Diagnosis:  Onychomycosis, , Pain in right toe, pain in left toes  Treatment & Plan Procedures and Treatment: Consent by patient was obtained for treatment procedures.   Debridement of mycotic and hypertrophic toenails, 1 through 5 bilateral and clearing of subungual debris. No ulceration, no infection noted.  Return Visit-Office Procedure: Patient instructed to return to the office for a follow up visit 3 months for continued evaluation and treatment.    Gardiner Barefoot DPM

## 2019-04-16 DIAGNOSIS — R109 Unspecified abdominal pain: Secondary | ICD-10-CM | POA: Diagnosis not present

## 2019-04-18 DIAGNOSIS — R319 Hematuria, unspecified: Secondary | ICD-10-CM | POA: Diagnosis not present

## 2019-04-18 DIAGNOSIS — N390 Urinary tract infection, site not specified: Secondary | ICD-10-CM | POA: Diagnosis not present

## 2019-04-18 DIAGNOSIS — E119 Type 2 diabetes mellitus without complications: Secondary | ICD-10-CM | POA: Diagnosis not present

## 2019-04-24 DIAGNOSIS — R296 Repeated falls: Secondary | ICD-10-CM | POA: Diagnosis not present

## 2019-04-26 DIAGNOSIS — F0150 Vascular dementia without behavioral disturbance: Secondary | ICD-10-CM | POA: Diagnosis not present

## 2019-04-26 DIAGNOSIS — F5105 Insomnia due to other mental disorder: Secondary | ICD-10-CM | POA: Diagnosis not present

## 2019-04-26 DIAGNOSIS — F330 Major depressive disorder, recurrent, mild: Secondary | ICD-10-CM | POA: Diagnosis not present

## 2019-04-26 DIAGNOSIS — Z1159 Encounter for screening for other viral diseases: Secondary | ICD-10-CM | POA: Diagnosis not present

## 2019-05-01 DIAGNOSIS — Z03818 Encounter for observation for suspected exposure to other biological agents ruled out: Secondary | ICD-10-CM | POA: Diagnosis not present

## 2019-05-02 DIAGNOSIS — Z03818 Encounter for observation for suspected exposure to other biological agents ruled out: Secondary | ICD-10-CM | POA: Diagnosis not present

## 2019-05-05 DIAGNOSIS — I10 Essential (primary) hypertension: Secondary | ICD-10-CM | POA: Diagnosis not present

## 2019-05-05 DIAGNOSIS — E7800 Pure hypercholesterolemia, unspecified: Secondary | ICD-10-CM | POA: Diagnosis not present

## 2019-05-05 DIAGNOSIS — R05 Cough: Secondary | ICD-10-CM | POA: Diagnosis not present

## 2019-05-05 DIAGNOSIS — E08311 Diabetes mellitus due to underlying condition with unspecified diabetic retinopathy with macular edema: Secondary | ICD-10-CM | POA: Diagnosis not present

## 2019-05-05 DIAGNOSIS — D649 Anemia, unspecified: Secondary | ICD-10-CM | POA: Diagnosis not present

## 2019-05-07 DIAGNOSIS — D649 Anemia, unspecified: Secondary | ICD-10-CM | POA: Diagnosis not present

## 2019-05-07 DIAGNOSIS — E7800 Pure hypercholesterolemia, unspecified: Secondary | ICD-10-CM | POA: Diagnosis not present

## 2019-05-07 DIAGNOSIS — B342 Coronavirus infection, unspecified: Secondary | ICD-10-CM | POA: Diagnosis not present

## 2019-05-07 DIAGNOSIS — U071 COVID-19: Secondary | ICD-10-CM | POA: Diagnosis not present

## 2019-05-08 DIAGNOSIS — E7800 Pure hypercholesterolemia, unspecified: Secondary | ICD-10-CM | POA: Diagnosis not present

## 2019-05-08 DIAGNOSIS — I10 Essential (primary) hypertension: Secondary | ICD-10-CM | POA: Diagnosis not present

## 2019-05-08 DIAGNOSIS — B342 Coronavirus infection, unspecified: Secondary | ICD-10-CM | POA: Diagnosis not present

## 2019-05-08 DIAGNOSIS — D649 Anemia, unspecified: Secondary | ICD-10-CM | POA: Diagnosis not present

## 2019-05-09 DIAGNOSIS — U071 COVID-19: Secondary | ICD-10-CM | POA: Diagnosis not present

## 2019-05-10 DIAGNOSIS — I10 Essential (primary) hypertension: Secondary | ICD-10-CM | POA: Diagnosis not present

## 2019-05-10 DIAGNOSIS — D649 Anemia, unspecified: Secondary | ICD-10-CM | POA: Diagnosis not present

## 2019-05-10 DIAGNOSIS — E119 Type 2 diabetes mellitus without complications: Secondary | ICD-10-CM | POA: Diagnosis not present

## 2019-05-10 DIAGNOSIS — U071 COVID-19: Secondary | ICD-10-CM | POA: Diagnosis not present

## 2019-05-16 DIAGNOSIS — F0390 Unspecified dementia without behavioral disturbance: Secondary | ICD-10-CM | POA: Diagnosis not present

## 2019-05-16 DIAGNOSIS — U071 COVID-19: Secondary | ICD-10-CM | POA: Diagnosis not present

## 2019-05-16 DIAGNOSIS — R2689 Other abnormalities of gait and mobility: Secondary | ICD-10-CM | POA: Diagnosis not present

## 2019-05-16 DIAGNOSIS — R2681 Unsteadiness on feet: Secondary | ICD-10-CM | POA: Diagnosis not present

## 2019-05-16 DIAGNOSIS — I10 Essential (primary) hypertension: Secondary | ICD-10-CM | POA: Diagnosis not present

## 2019-05-16 DIAGNOSIS — M6281 Muscle weakness (generalized): Secondary | ICD-10-CM | POA: Diagnosis not present

## 2019-05-23 DIAGNOSIS — U071 COVID-19: Secondary | ICD-10-CM | POA: Diagnosis not present

## 2019-05-23 DIAGNOSIS — D649 Anemia, unspecified: Secondary | ICD-10-CM | POA: Diagnosis not present

## 2019-05-30 DIAGNOSIS — U071 COVID-19: Secondary | ICD-10-CM | POA: Diagnosis not present

## 2019-05-30 DIAGNOSIS — E7849 Other hyperlipidemia: Secondary | ICD-10-CM | POA: Diagnosis not present

## 2019-05-30 DIAGNOSIS — R54 Age-related physical debility: Secondary | ICD-10-CM | POA: Diagnosis not present

## 2019-05-30 DIAGNOSIS — I69398 Other sequelae of cerebral infarction: Secondary | ICD-10-CM | POA: Diagnosis not present

## 2019-06-05 DIAGNOSIS — R2681 Unsteadiness on feet: Secondary | ICD-10-CM | POA: Diagnosis not present

## 2019-06-05 DIAGNOSIS — F0390 Unspecified dementia without behavioral disturbance: Secondary | ICD-10-CM | POA: Diagnosis not present

## 2019-06-05 DIAGNOSIS — U071 COVID-19: Secondary | ICD-10-CM | POA: Diagnosis not present

## 2019-06-05 DIAGNOSIS — R2689 Other abnormalities of gait and mobility: Secondary | ICD-10-CM | POA: Diagnosis not present

## 2019-06-05 DIAGNOSIS — I69320 Aphasia following cerebral infarction: Secondary | ICD-10-CM | POA: Diagnosis not present

## 2019-06-05 DIAGNOSIS — M6281 Muscle weakness (generalized): Secondary | ICD-10-CM | POA: Diagnosis not present

## 2019-06-05 DIAGNOSIS — I6930 Unspecified sequelae of cerebral infarction: Secondary | ICD-10-CM | POA: Diagnosis not present

## 2019-06-05 DIAGNOSIS — I10 Essential (primary) hypertension: Secondary | ICD-10-CM | POA: Diagnosis not present

## 2019-06-05 DIAGNOSIS — R41841 Cognitive communication deficit: Secondary | ICD-10-CM | POA: Diagnosis not present

## 2019-06-06 DIAGNOSIS — M6281 Muscle weakness (generalized): Secondary | ICD-10-CM | POA: Diagnosis not present

## 2019-06-06 DIAGNOSIS — I69320 Aphasia following cerebral infarction: Secondary | ICD-10-CM | POA: Diagnosis not present

## 2019-06-06 DIAGNOSIS — R2689 Other abnormalities of gait and mobility: Secondary | ICD-10-CM | POA: Diagnosis not present

## 2019-06-06 DIAGNOSIS — U071 COVID-19: Secondary | ICD-10-CM | POA: Diagnosis not present

## 2019-06-06 DIAGNOSIS — I6930 Unspecified sequelae of cerebral infarction: Secondary | ICD-10-CM | POA: Diagnosis not present

## 2019-06-06 DIAGNOSIS — R2681 Unsteadiness on feet: Secondary | ICD-10-CM | POA: Diagnosis not present

## 2019-06-06 DIAGNOSIS — I10 Essential (primary) hypertension: Secondary | ICD-10-CM | POA: Diagnosis not present

## 2019-06-06 DIAGNOSIS — F0390 Unspecified dementia without behavioral disturbance: Secondary | ICD-10-CM | POA: Diagnosis not present

## 2019-06-07 DIAGNOSIS — R2681 Unsteadiness on feet: Secondary | ICD-10-CM | POA: Diagnosis not present

## 2019-06-07 DIAGNOSIS — I6930 Unspecified sequelae of cerebral infarction: Secondary | ICD-10-CM | POA: Diagnosis not present

## 2019-06-07 DIAGNOSIS — R2689 Other abnormalities of gait and mobility: Secondary | ICD-10-CM | POA: Diagnosis not present

## 2019-06-07 DIAGNOSIS — M6281 Muscle weakness (generalized): Secondary | ICD-10-CM | POA: Diagnosis not present

## 2019-06-07 DIAGNOSIS — F0390 Unspecified dementia without behavioral disturbance: Secondary | ICD-10-CM | POA: Diagnosis not present

## 2019-06-07 DIAGNOSIS — U071 COVID-19: Secondary | ICD-10-CM | POA: Diagnosis not present

## 2019-06-07 DIAGNOSIS — I69320 Aphasia following cerebral infarction: Secondary | ICD-10-CM | POA: Diagnosis not present

## 2019-06-07 DIAGNOSIS — I10 Essential (primary) hypertension: Secondary | ICD-10-CM | POA: Diagnosis not present

## 2019-06-08 DIAGNOSIS — R2689 Other abnormalities of gait and mobility: Secondary | ICD-10-CM | POA: Diagnosis not present

## 2019-06-08 DIAGNOSIS — R2681 Unsteadiness on feet: Secondary | ICD-10-CM | POA: Diagnosis not present

## 2019-06-08 DIAGNOSIS — I10 Essential (primary) hypertension: Secondary | ICD-10-CM | POA: Diagnosis not present

## 2019-06-08 DIAGNOSIS — M6281 Muscle weakness (generalized): Secondary | ICD-10-CM | POA: Diagnosis not present

## 2019-06-08 DIAGNOSIS — I69320 Aphasia following cerebral infarction: Secondary | ICD-10-CM | POA: Diagnosis not present

## 2019-06-08 DIAGNOSIS — I6930 Unspecified sequelae of cerebral infarction: Secondary | ICD-10-CM | POA: Diagnosis not present

## 2019-06-08 DIAGNOSIS — U071 COVID-19: Secondary | ICD-10-CM | POA: Diagnosis not present

## 2019-06-08 DIAGNOSIS — F0390 Unspecified dementia without behavioral disturbance: Secondary | ICD-10-CM | POA: Diagnosis not present

## 2019-06-09 DIAGNOSIS — R2681 Unsteadiness on feet: Secondary | ICD-10-CM | POA: Diagnosis not present

## 2019-06-09 DIAGNOSIS — I10 Essential (primary) hypertension: Secondary | ICD-10-CM | POA: Diagnosis not present

## 2019-06-09 DIAGNOSIS — I69320 Aphasia following cerebral infarction: Secondary | ICD-10-CM | POA: Diagnosis not present

## 2019-06-09 DIAGNOSIS — U071 COVID-19: Secondary | ICD-10-CM | POA: Diagnosis not present

## 2019-06-09 DIAGNOSIS — F0390 Unspecified dementia without behavioral disturbance: Secondary | ICD-10-CM | POA: Diagnosis not present

## 2019-06-09 DIAGNOSIS — I6930 Unspecified sequelae of cerebral infarction: Secondary | ICD-10-CM | POA: Diagnosis not present

## 2019-06-09 DIAGNOSIS — M6281 Muscle weakness (generalized): Secondary | ICD-10-CM | POA: Diagnosis not present

## 2019-06-09 DIAGNOSIS — R2689 Other abnormalities of gait and mobility: Secondary | ICD-10-CM | POA: Diagnosis not present

## 2019-06-21 DIAGNOSIS — F0150 Vascular dementia without behavioral disturbance: Secondary | ICD-10-CM | POA: Diagnosis not present

## 2019-06-21 DIAGNOSIS — U071 COVID-19: Secondary | ICD-10-CM | POA: Diagnosis not present

## 2019-06-21 DIAGNOSIS — F5105 Insomnia due to other mental disorder: Secondary | ICD-10-CM | POA: Diagnosis not present

## 2019-06-21 DIAGNOSIS — E7849 Other hyperlipidemia: Secondary | ICD-10-CM | POA: Diagnosis not present

## 2019-06-21 DIAGNOSIS — F330 Major depressive disorder, recurrent, mild: Secondary | ICD-10-CM | POA: Diagnosis not present

## 2019-06-21 DIAGNOSIS — I69398 Other sequelae of cerebral infarction: Secondary | ICD-10-CM | POA: Diagnosis not present

## 2019-06-21 DIAGNOSIS — R54 Age-related physical debility: Secondary | ICD-10-CM | POA: Diagnosis not present

## 2019-06-26 DIAGNOSIS — H353221 Exudative age-related macular degeneration, left eye, with active choroidal neovascularization: Secondary | ICD-10-CM | POA: Diagnosis not present

## 2019-06-26 DIAGNOSIS — H53131 Sudden visual loss, right eye: Secondary | ICD-10-CM | POA: Diagnosis not present

## 2019-06-26 DIAGNOSIS — H34811 Central retinal vein occlusion, right eye, with macular edema: Secondary | ICD-10-CM | POA: Diagnosis not present

## 2019-06-26 DIAGNOSIS — H3582 Retinal ischemia: Secondary | ICD-10-CM | POA: Diagnosis not present

## 2019-06-27 DIAGNOSIS — E1169 Type 2 diabetes mellitus with other specified complication: Secondary | ICD-10-CM | POA: Diagnosis not present

## 2019-06-27 DIAGNOSIS — Z79899 Other long term (current) drug therapy: Secondary | ICD-10-CM | POA: Diagnosis not present

## 2019-06-30 DIAGNOSIS — H34811 Central retinal vein occlusion, right eye, with macular edema: Secondary | ICD-10-CM | POA: Diagnosis not present

## 2019-07-07 DIAGNOSIS — R509 Fever, unspecified: Secondary | ICD-10-CM | POA: Diagnosis not present

## 2019-07-10 DIAGNOSIS — E119 Type 2 diabetes mellitus without complications: Secondary | ICD-10-CM | POA: Diagnosis not present

## 2019-07-12 ENCOUNTER — Ambulatory Visit: Payer: Self-pay | Admitting: Podiatry

## 2019-07-14 DIAGNOSIS — H353221 Exudative age-related macular degeneration, left eye, with active choroidal neovascularization: Secondary | ICD-10-CM | POA: Diagnosis not present

## 2019-07-19 DIAGNOSIS — F330 Major depressive disorder, recurrent, mild: Secondary | ICD-10-CM | POA: Diagnosis not present

## 2019-07-19 DIAGNOSIS — F0150 Vascular dementia without behavioral disturbance: Secondary | ICD-10-CM | POA: Diagnosis not present

## 2019-07-21 DIAGNOSIS — G4701 Insomnia due to medical condition: Secondary | ICD-10-CM | POA: Diagnosis not present

## 2019-07-21 DIAGNOSIS — F0150 Vascular dementia without behavioral disturbance: Secondary | ICD-10-CM | POA: Diagnosis not present

## 2019-07-28 DIAGNOSIS — E7849 Other hyperlipidemia: Secondary | ICD-10-CM | POA: Diagnosis not present

## 2019-07-28 DIAGNOSIS — E118 Type 2 diabetes mellitus with unspecified complications: Secondary | ICD-10-CM | POA: Diagnosis not present

## 2019-07-28 DIAGNOSIS — I639 Cerebral infarction, unspecified: Secondary | ICD-10-CM | POA: Diagnosis not present

## 2019-07-28 DIAGNOSIS — R54 Age-related physical debility: Secondary | ICD-10-CM | POA: Diagnosis not present

## 2019-08-02 DIAGNOSIS — H34811 Central retinal vein occlusion, right eye, with macular edema: Secondary | ICD-10-CM | POA: Diagnosis not present

## 2019-08-21 DIAGNOSIS — H353221 Exudative age-related macular degeneration, left eye, with active choroidal neovascularization: Secondary | ICD-10-CM | POA: Diagnosis not present

## 2019-08-21 DIAGNOSIS — Z041 Encounter for examination and observation following transport accident: Secondary | ICD-10-CM | POA: Diagnosis not present

## 2019-08-30 DIAGNOSIS — F330 Major depressive disorder, recurrent, mild: Secondary | ICD-10-CM | POA: Diagnosis not present

## 2019-08-30 DIAGNOSIS — F0150 Vascular dementia without behavioral disturbance: Secondary | ICD-10-CM | POA: Diagnosis not present

## 2019-09-06 DIAGNOSIS — H34811 Central retinal vein occlusion, right eye, with macular edema: Secondary | ICD-10-CM | POA: Diagnosis not present

## 2019-09-28 DIAGNOSIS — E119 Type 2 diabetes mellitus without complications: Secondary | ICD-10-CM | POA: Diagnosis not present

## 2019-09-28 DIAGNOSIS — Z8673 Personal history of transient ischemic attack (TIA), and cerebral infarction without residual deficits: Secondary | ICD-10-CM | POA: Diagnosis not present

## 2019-09-28 DIAGNOSIS — E785 Hyperlipidemia, unspecified: Secondary | ICD-10-CM | POA: Diagnosis not present

## 2019-09-28 DIAGNOSIS — R54 Age-related physical debility: Secondary | ICD-10-CM | POA: Diagnosis not present

## 2019-10-09 DIAGNOSIS — H34811 Central retinal vein occlusion, right eye, with macular edema: Secondary | ICD-10-CM | POA: Diagnosis not present

## 2019-10-11 DIAGNOSIS — E118 Type 2 diabetes mellitus with unspecified complications: Secondary | ICD-10-CM | POA: Diagnosis not present

## 2019-10-12 DIAGNOSIS — E1169 Type 2 diabetes mellitus with other specified complication: Secondary | ICD-10-CM | POA: Diagnosis not present

## 2019-10-12 DIAGNOSIS — E119 Type 2 diabetes mellitus without complications: Secondary | ICD-10-CM | POA: Diagnosis not present

## 2019-11-08 DIAGNOSIS — E785 Hyperlipidemia, unspecified: Secondary | ICD-10-CM | POA: Diagnosis not present

## 2019-11-08 DIAGNOSIS — I739 Peripheral vascular disease, unspecified: Secondary | ICD-10-CM | POA: Diagnosis not present

## 2019-11-08 DIAGNOSIS — E118 Type 2 diabetes mellitus with unspecified complications: Secondary | ICD-10-CM | POA: Diagnosis not present

## 2019-11-08 DIAGNOSIS — Z8673 Personal history of transient ischemic attack (TIA), and cerebral infarction without residual deficits: Secondary | ICD-10-CM | POA: Diagnosis not present

## 2019-11-09 DIAGNOSIS — E119 Type 2 diabetes mellitus without complications: Secondary | ICD-10-CM | POA: Diagnosis not present

## 2019-11-09 DIAGNOSIS — B342 Coronavirus infection, unspecified: Secondary | ICD-10-CM | POA: Diagnosis not present

## 2019-11-09 DIAGNOSIS — H34811 Central retinal vein occlusion, right eye, with macular edema: Secondary | ICD-10-CM | POA: Diagnosis not present

## 2019-11-09 DIAGNOSIS — E1169 Type 2 diabetes mellitus with other specified complication: Secondary | ICD-10-CM | POA: Diagnosis not present

## 2019-11-09 DIAGNOSIS — E08311 Diabetes mellitus due to underlying condition with unspecified diabetic retinopathy with macular edema: Secondary | ICD-10-CM | POA: Diagnosis not present

## 2019-12-14 DIAGNOSIS — U071 COVID-19: Secondary | ICD-10-CM | POA: Diagnosis not present

## 2019-12-14 DIAGNOSIS — R2689 Other abnormalities of gait and mobility: Secondary | ICD-10-CM | POA: Diagnosis not present

## 2019-12-14 DIAGNOSIS — I10 Essential (primary) hypertension: Secondary | ICD-10-CM | POA: Diagnosis not present

## 2019-12-14 DIAGNOSIS — H34811 Central retinal vein occlusion, right eye, with macular edema: Secondary | ICD-10-CM | POA: Diagnosis not present

## 2019-12-14 DIAGNOSIS — F0390 Unspecified dementia without behavioral disturbance: Secondary | ICD-10-CM | POA: Diagnosis not present

## 2019-12-14 DIAGNOSIS — R2681 Unsteadiness on feet: Secondary | ICD-10-CM | POA: Diagnosis not present

## 2019-12-14 DIAGNOSIS — I63532 Cerebral infarction due to unspecified occlusion or stenosis of left posterior cerebral artery: Secondary | ICD-10-CM | POA: Diagnosis not present

## 2019-12-14 DIAGNOSIS — M6281 Muscle weakness (generalized): Secondary | ICD-10-CM | POA: Diagnosis not present

## 2019-12-19 DIAGNOSIS — R2681 Unsteadiness on feet: Secondary | ICD-10-CM | POA: Diagnosis not present

## 2019-12-19 DIAGNOSIS — F0390 Unspecified dementia without behavioral disturbance: Secondary | ICD-10-CM | POA: Diagnosis not present

## 2019-12-19 DIAGNOSIS — I63532 Cerebral infarction due to unspecified occlusion or stenosis of left posterior cerebral artery: Secondary | ICD-10-CM | POA: Diagnosis not present

## 2019-12-19 DIAGNOSIS — U071 COVID-19: Secondary | ICD-10-CM | POA: Diagnosis not present

## 2019-12-19 DIAGNOSIS — I10 Essential (primary) hypertension: Secondary | ICD-10-CM | POA: Diagnosis not present

## 2019-12-19 DIAGNOSIS — R2689 Other abnormalities of gait and mobility: Secondary | ICD-10-CM | POA: Diagnosis not present

## 2019-12-19 DIAGNOSIS — M6281 Muscle weakness (generalized): Secondary | ICD-10-CM | POA: Diagnosis not present

## 2019-12-21 DIAGNOSIS — I10 Essential (primary) hypertension: Secondary | ICD-10-CM | POA: Diagnosis not present

## 2019-12-21 DIAGNOSIS — I63532 Cerebral infarction due to unspecified occlusion or stenosis of left posterior cerebral artery: Secondary | ICD-10-CM | POA: Diagnosis not present

## 2019-12-21 DIAGNOSIS — U071 COVID-19: Secondary | ICD-10-CM | POA: Diagnosis not present

## 2019-12-21 DIAGNOSIS — M6281 Muscle weakness (generalized): Secondary | ICD-10-CM | POA: Diagnosis not present

## 2019-12-21 DIAGNOSIS — R2681 Unsteadiness on feet: Secondary | ICD-10-CM | POA: Diagnosis not present

## 2019-12-21 DIAGNOSIS — R2689 Other abnormalities of gait and mobility: Secondary | ICD-10-CM | POA: Diagnosis not present

## 2019-12-21 DIAGNOSIS — F0390 Unspecified dementia without behavioral disturbance: Secondary | ICD-10-CM | POA: Diagnosis not present

## 2019-12-26 DIAGNOSIS — I63532 Cerebral infarction due to unspecified occlusion or stenosis of left posterior cerebral artery: Secondary | ICD-10-CM | POA: Diagnosis not present

## 2019-12-26 DIAGNOSIS — M6281 Muscle weakness (generalized): Secondary | ICD-10-CM | POA: Diagnosis not present

## 2019-12-26 DIAGNOSIS — U071 COVID-19: Secondary | ICD-10-CM | POA: Diagnosis not present

## 2019-12-26 DIAGNOSIS — F0390 Unspecified dementia without behavioral disturbance: Secondary | ICD-10-CM | POA: Diagnosis not present

## 2019-12-26 DIAGNOSIS — R2689 Other abnormalities of gait and mobility: Secondary | ICD-10-CM | POA: Diagnosis not present

## 2019-12-26 DIAGNOSIS — R2681 Unsteadiness on feet: Secondary | ICD-10-CM | POA: Diagnosis not present

## 2019-12-26 DIAGNOSIS — I10 Essential (primary) hypertension: Secondary | ICD-10-CM | POA: Diagnosis not present

## 2019-12-28 DIAGNOSIS — R2689 Other abnormalities of gait and mobility: Secondary | ICD-10-CM | POA: Diagnosis not present

## 2019-12-28 DIAGNOSIS — I63532 Cerebral infarction due to unspecified occlusion or stenosis of left posterior cerebral artery: Secondary | ICD-10-CM | POA: Diagnosis not present

## 2019-12-28 DIAGNOSIS — F0390 Unspecified dementia without behavioral disturbance: Secondary | ICD-10-CM | POA: Diagnosis not present

## 2019-12-28 DIAGNOSIS — R2681 Unsteadiness on feet: Secondary | ICD-10-CM | POA: Diagnosis not present

## 2019-12-28 DIAGNOSIS — I10 Essential (primary) hypertension: Secondary | ICD-10-CM | POA: Diagnosis not present

## 2019-12-28 DIAGNOSIS — M6281 Muscle weakness (generalized): Secondary | ICD-10-CM | POA: Diagnosis not present

## 2019-12-28 DIAGNOSIS — U071 COVID-19: Secondary | ICD-10-CM | POA: Diagnosis not present

## 2020-01-02 DIAGNOSIS — M6281 Muscle weakness (generalized): Secondary | ICD-10-CM | POA: Diagnosis not present

## 2020-01-02 DIAGNOSIS — U071 COVID-19: Secondary | ICD-10-CM | POA: Diagnosis not present

## 2020-01-02 DIAGNOSIS — I63532 Cerebral infarction due to unspecified occlusion or stenosis of left posterior cerebral artery: Secondary | ICD-10-CM | POA: Diagnosis not present

## 2020-01-02 DIAGNOSIS — R2689 Other abnormalities of gait and mobility: Secondary | ICD-10-CM | POA: Diagnosis not present

## 2020-01-02 DIAGNOSIS — R2681 Unsteadiness on feet: Secondary | ICD-10-CM | POA: Diagnosis not present

## 2020-01-02 DIAGNOSIS — I10 Essential (primary) hypertension: Secondary | ICD-10-CM | POA: Diagnosis not present

## 2020-01-02 DIAGNOSIS — F0390 Unspecified dementia without behavioral disturbance: Secondary | ICD-10-CM | POA: Diagnosis not present

## 2020-01-04 DIAGNOSIS — I10 Essential (primary) hypertension: Secondary | ICD-10-CM | POA: Diagnosis not present

## 2020-01-04 DIAGNOSIS — U071 COVID-19: Secondary | ICD-10-CM | POA: Diagnosis not present

## 2020-01-04 DIAGNOSIS — F0390 Unspecified dementia without behavioral disturbance: Secondary | ICD-10-CM | POA: Diagnosis not present

## 2020-01-04 DIAGNOSIS — M6281 Muscle weakness (generalized): Secondary | ICD-10-CM | POA: Diagnosis not present

## 2020-01-04 DIAGNOSIS — R2689 Other abnormalities of gait and mobility: Secondary | ICD-10-CM | POA: Diagnosis not present

## 2020-01-04 DIAGNOSIS — R2681 Unsteadiness on feet: Secondary | ICD-10-CM | POA: Diagnosis not present

## 2020-01-04 DIAGNOSIS — I63532 Cerebral infarction due to unspecified occlusion or stenosis of left posterior cerebral artery: Secondary | ICD-10-CM | POA: Diagnosis not present

## 2020-01-09 DIAGNOSIS — I63532 Cerebral infarction due to unspecified occlusion or stenosis of left posterior cerebral artery: Secondary | ICD-10-CM | POA: Diagnosis not present

## 2020-01-09 DIAGNOSIS — R2689 Other abnormalities of gait and mobility: Secondary | ICD-10-CM | POA: Diagnosis not present

## 2020-01-09 DIAGNOSIS — M6281 Muscle weakness (generalized): Secondary | ICD-10-CM | POA: Diagnosis not present

## 2020-01-09 DIAGNOSIS — R2681 Unsteadiness on feet: Secondary | ICD-10-CM | POA: Diagnosis not present

## 2020-01-09 DIAGNOSIS — U071 COVID-19: Secondary | ICD-10-CM | POA: Diagnosis not present

## 2020-01-09 DIAGNOSIS — F0390 Unspecified dementia without behavioral disturbance: Secondary | ICD-10-CM | POA: Diagnosis not present

## 2020-01-09 DIAGNOSIS — I10 Essential (primary) hypertension: Secondary | ICD-10-CM | POA: Diagnosis not present

## 2020-01-23 DIAGNOSIS — H34811 Central retinal vein occlusion, right eye, with macular edema: Secondary | ICD-10-CM | POA: Diagnosis not present

## 2020-01-23 DIAGNOSIS — L602 Onychogryphosis: Secondary | ICD-10-CM | POA: Diagnosis not present

## 2020-01-23 DIAGNOSIS — E1140 Type 2 diabetes mellitus with diabetic neuropathy, unspecified: Secondary | ICD-10-CM | POA: Diagnosis not present

## 2020-02-05 DIAGNOSIS — N390 Urinary tract infection, site not specified: Secondary | ICD-10-CM | POA: Diagnosis not present

## 2020-02-27 DIAGNOSIS — H34811 Central retinal vein occlusion, right eye, with macular edema: Secondary | ICD-10-CM | POA: Diagnosis not present

## 2020-03-18 IMAGING — CT CT HEAD W/O CM
3 series · 15 of 47 positions shown, 18 images · non-contrast
Comparison: None.

CLINICAL DATA: Altered level of consciousness

EXAM:
CT HEAD WITHOUT CONTRAST
TECHNIQUE: Contiguous axial images were obtained from the base of the skull
through the vertex without intravenous contrast.

[Series 2: head wo · axial · 0.43mm/px · z∈[-60,+64]mm · 9 of 31 slices shown, 12 images]
[im 3/31  brain]
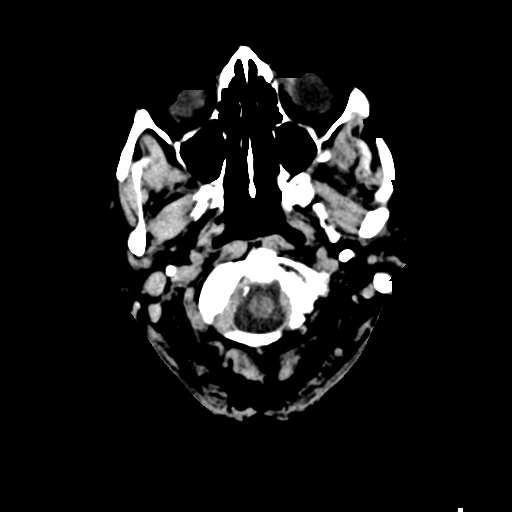
[im 3/31  bone]
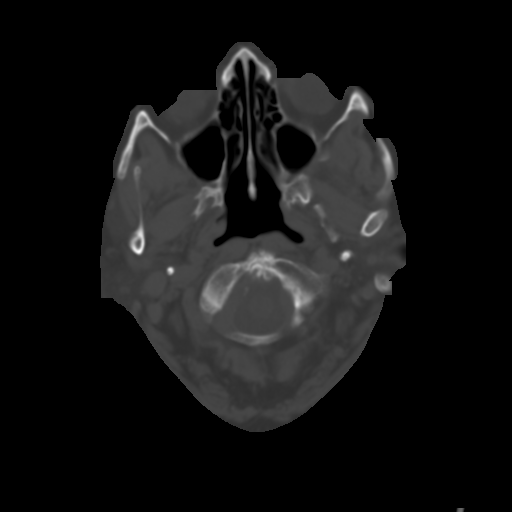
[im 6/31  brain]
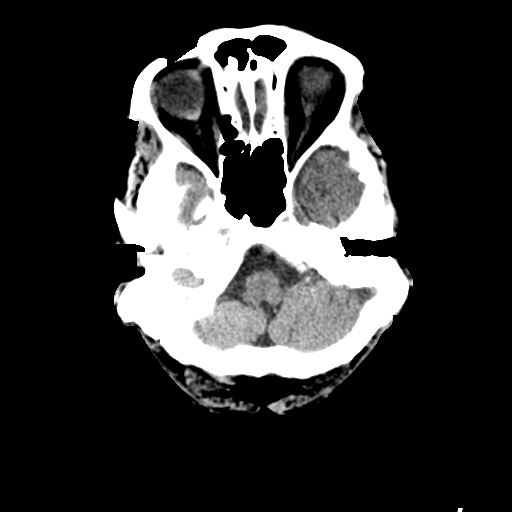
[im 9/31  brain]
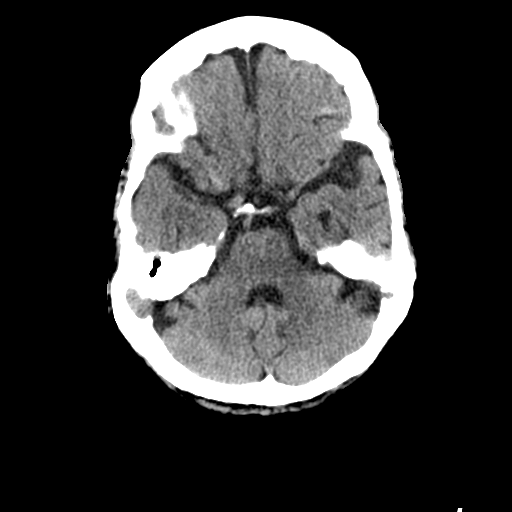
[im 12/31  brain]
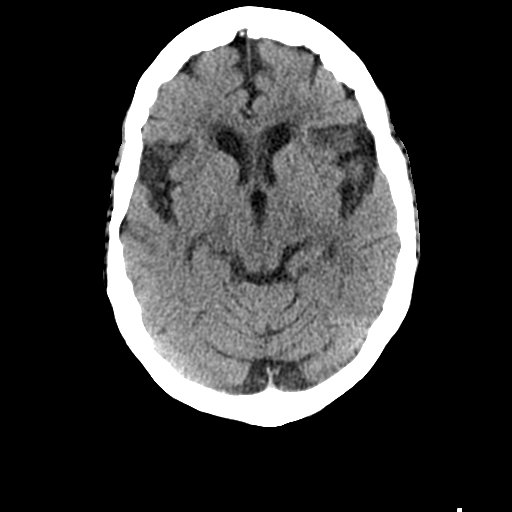
[im 16/31  brain]
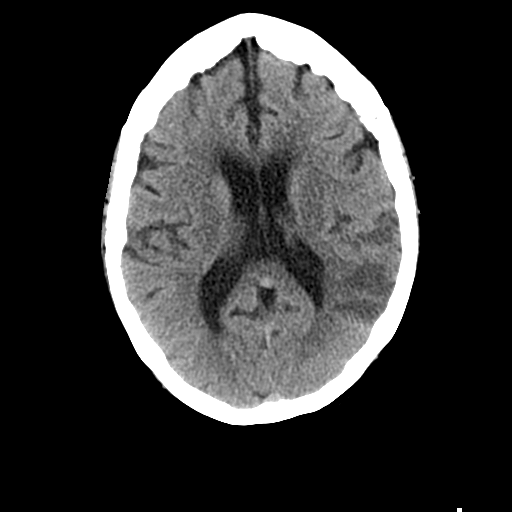
[im 16/31  bone]
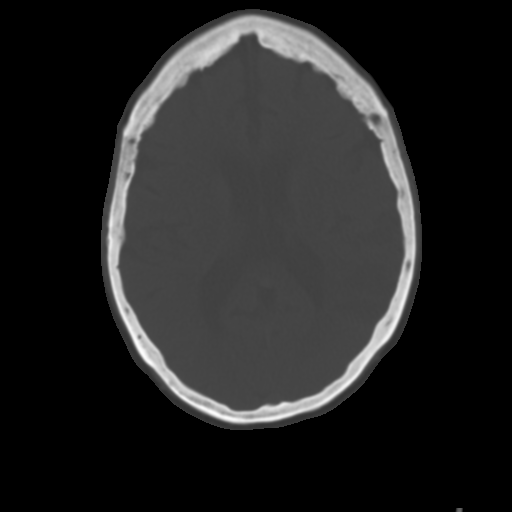
[im 19/31  brain]
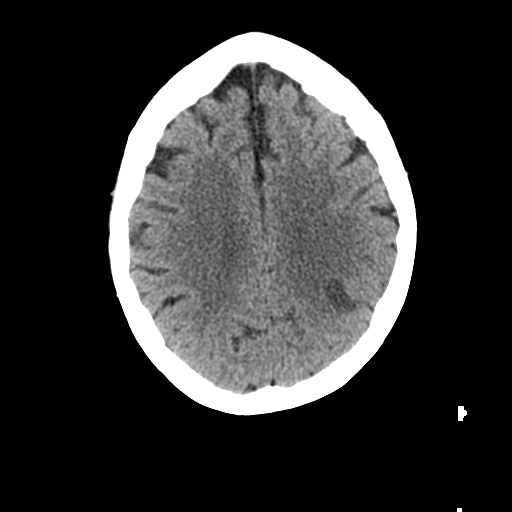
[im 22/31  brain]
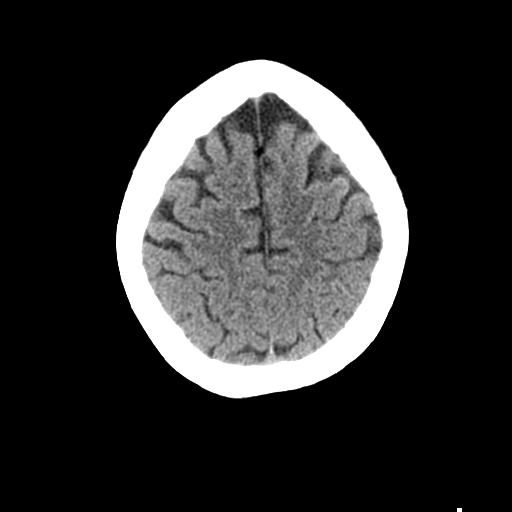
[im 25/31  brain]
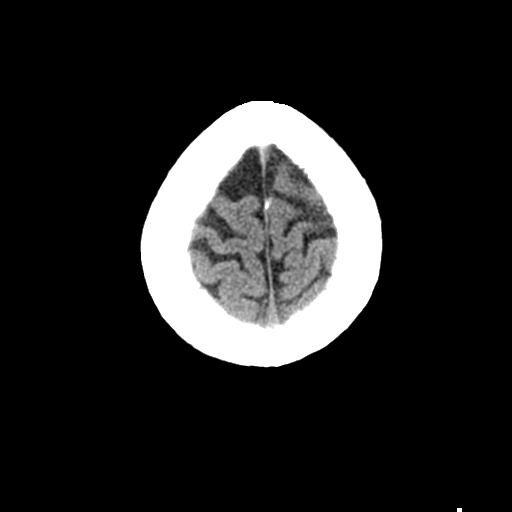
[im 28/31  brain]
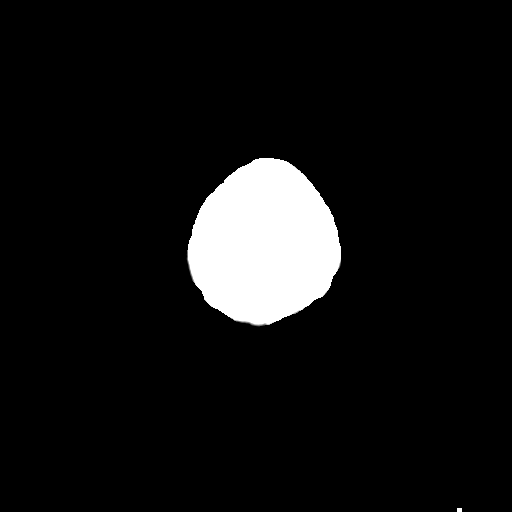
[im 28/31  bone]
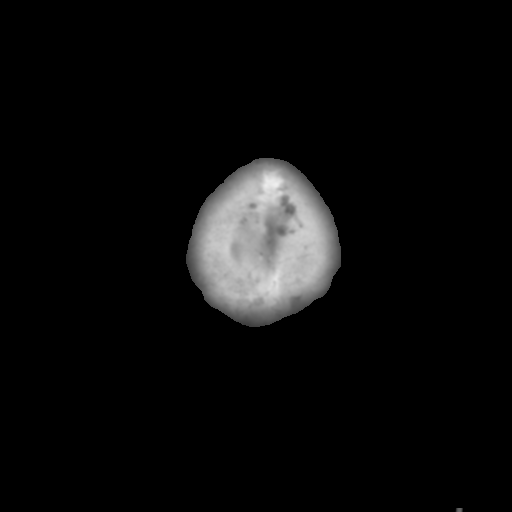

[Series 5: coronal soft tissue · coronal · 0.30mm/px · 3 of 69 slices shown]
[im 23/69  brain]
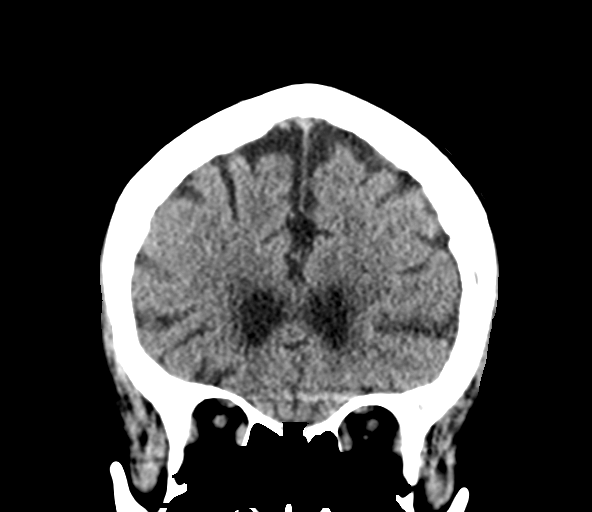
[im 31/69  brain]
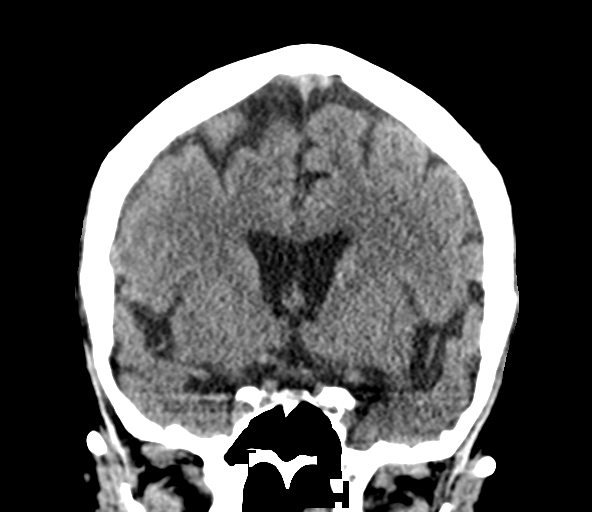
[im 38/69  brain]
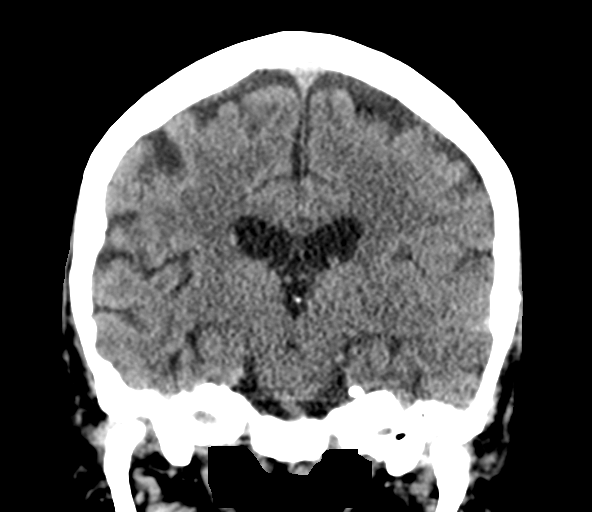

[Series 6: sagittal soft tissue · sagittal · 0.30mm/px · 3 of 60 slices shown]
[im 20/60  brain]
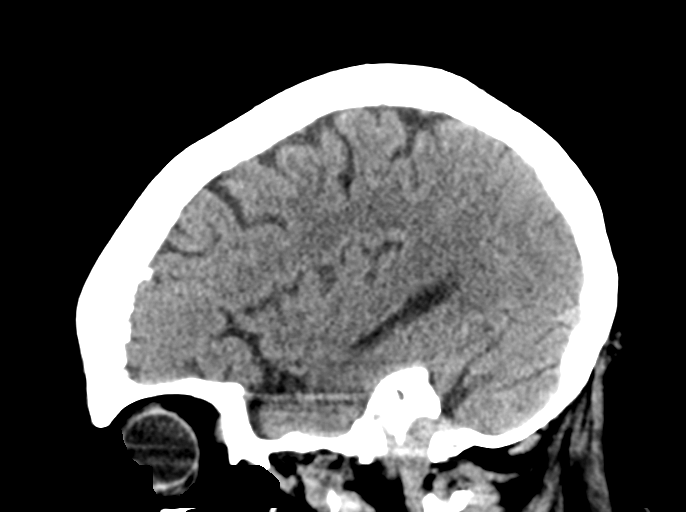
[im 30/60  brain]
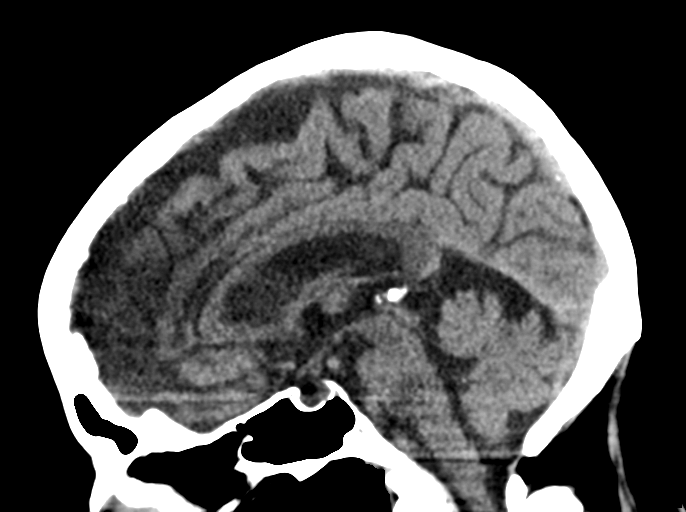
[im 40/60  brain]
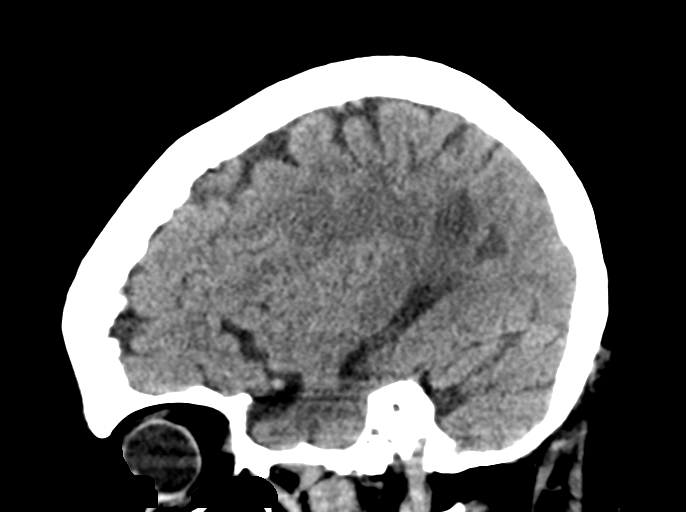

[15 of 47 positions shown; findings below may reference images not displayed]

FINDINGS: Brain: There is an area of low-density in the left parietal lobe
compatible with acute to subacute infarct. No hemorrhage. No
hydrocephalus or midline shift.

Vascular: No hyperdense vessel or unexpected calcification.

Skull: No acute calvarial abnormality.

Sinuses/Orbits: Visualized paranasal sinuses and mastoids clear.
Orbital soft tissues unremarkable.

Other: None
IMPRESSION: Low-density in the left parietal lobe compatible with acute to
subacute infarction. No hemorrhage.

## 2020-03-18 IMAGING — CR DG CHEST 2V
2 series · 2 of 2 positions shown · non-contrast
Comparison: None.

CLINICAL DATA: Altered mental status

EXAM:
CHEST - 2 VIEW

[w chest lat]
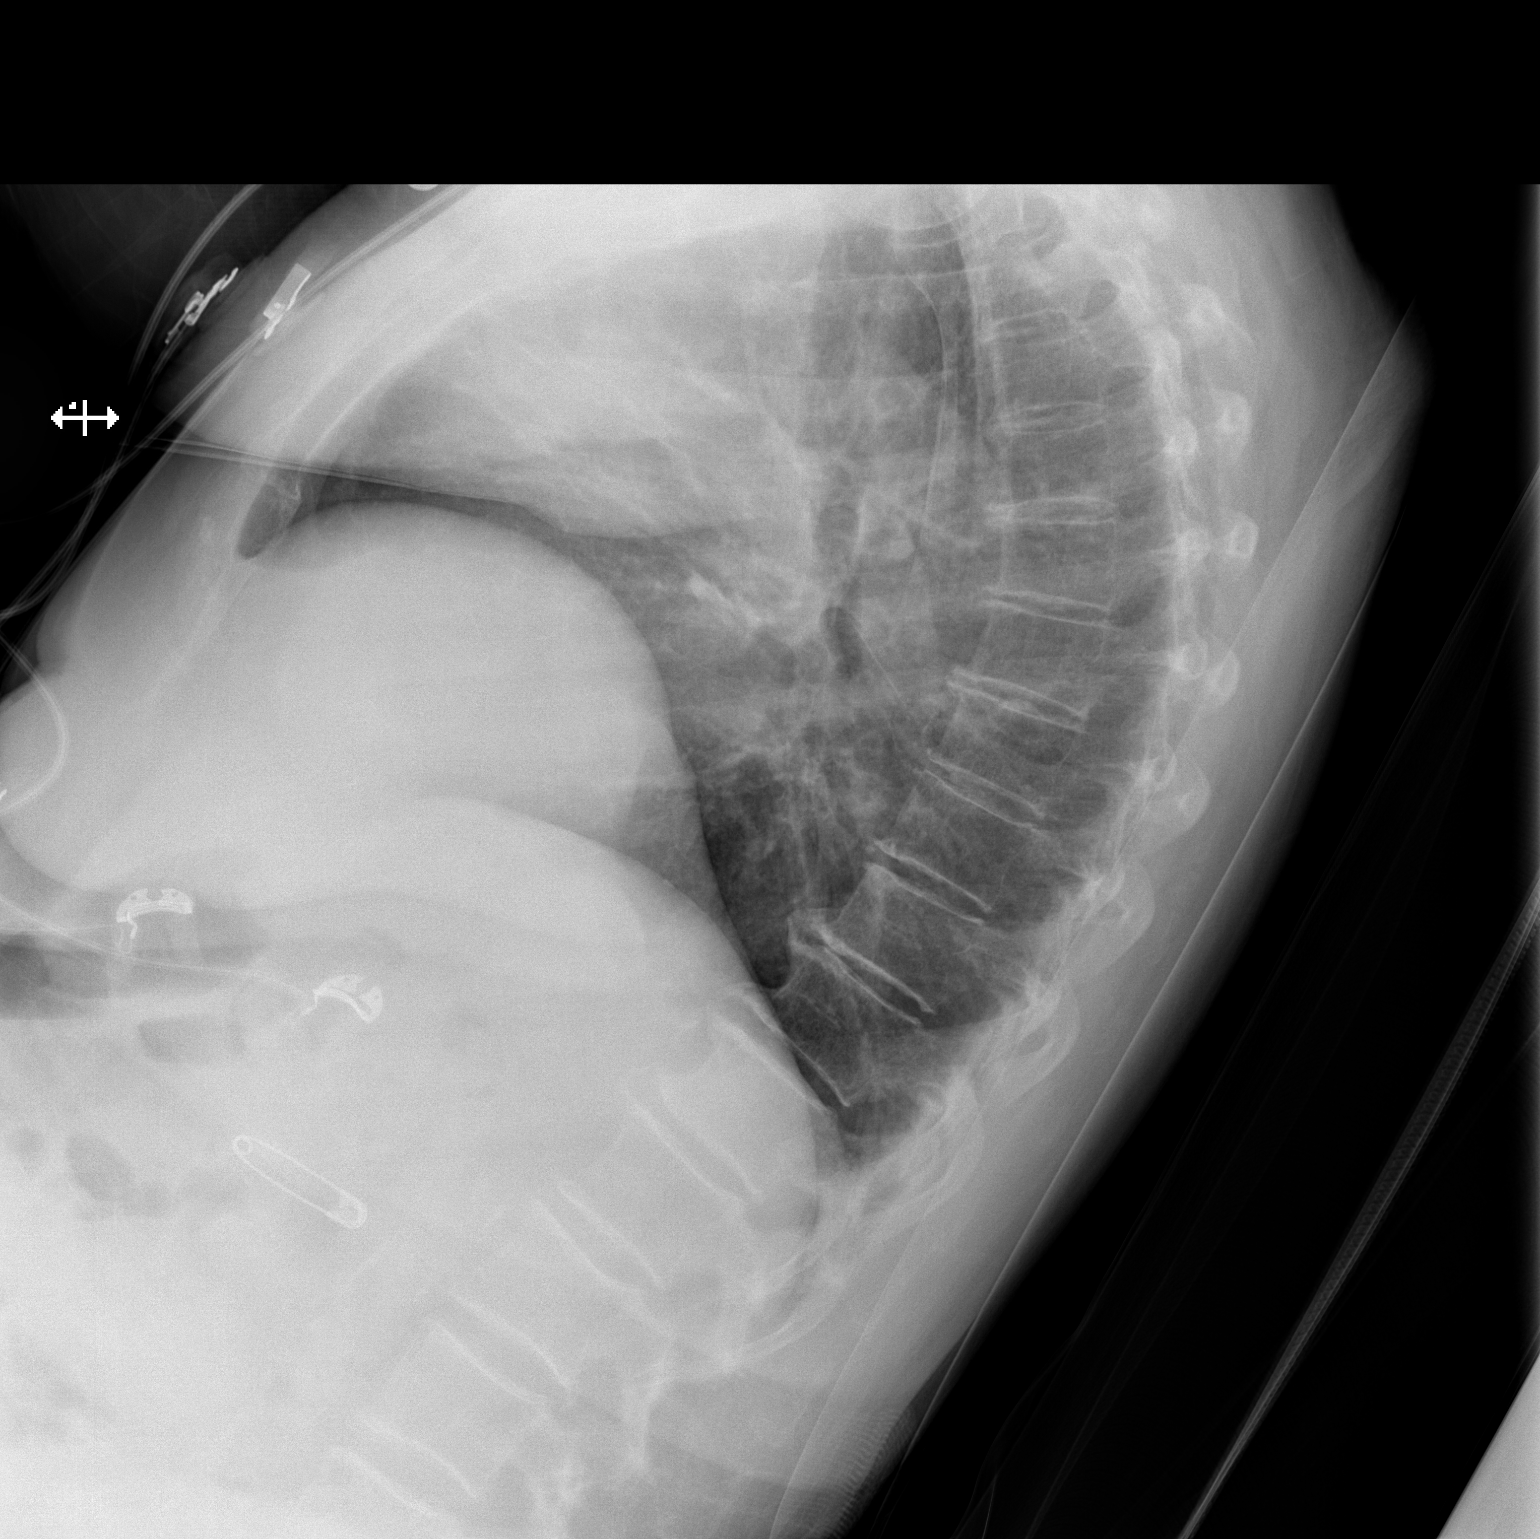

[x chest ap]
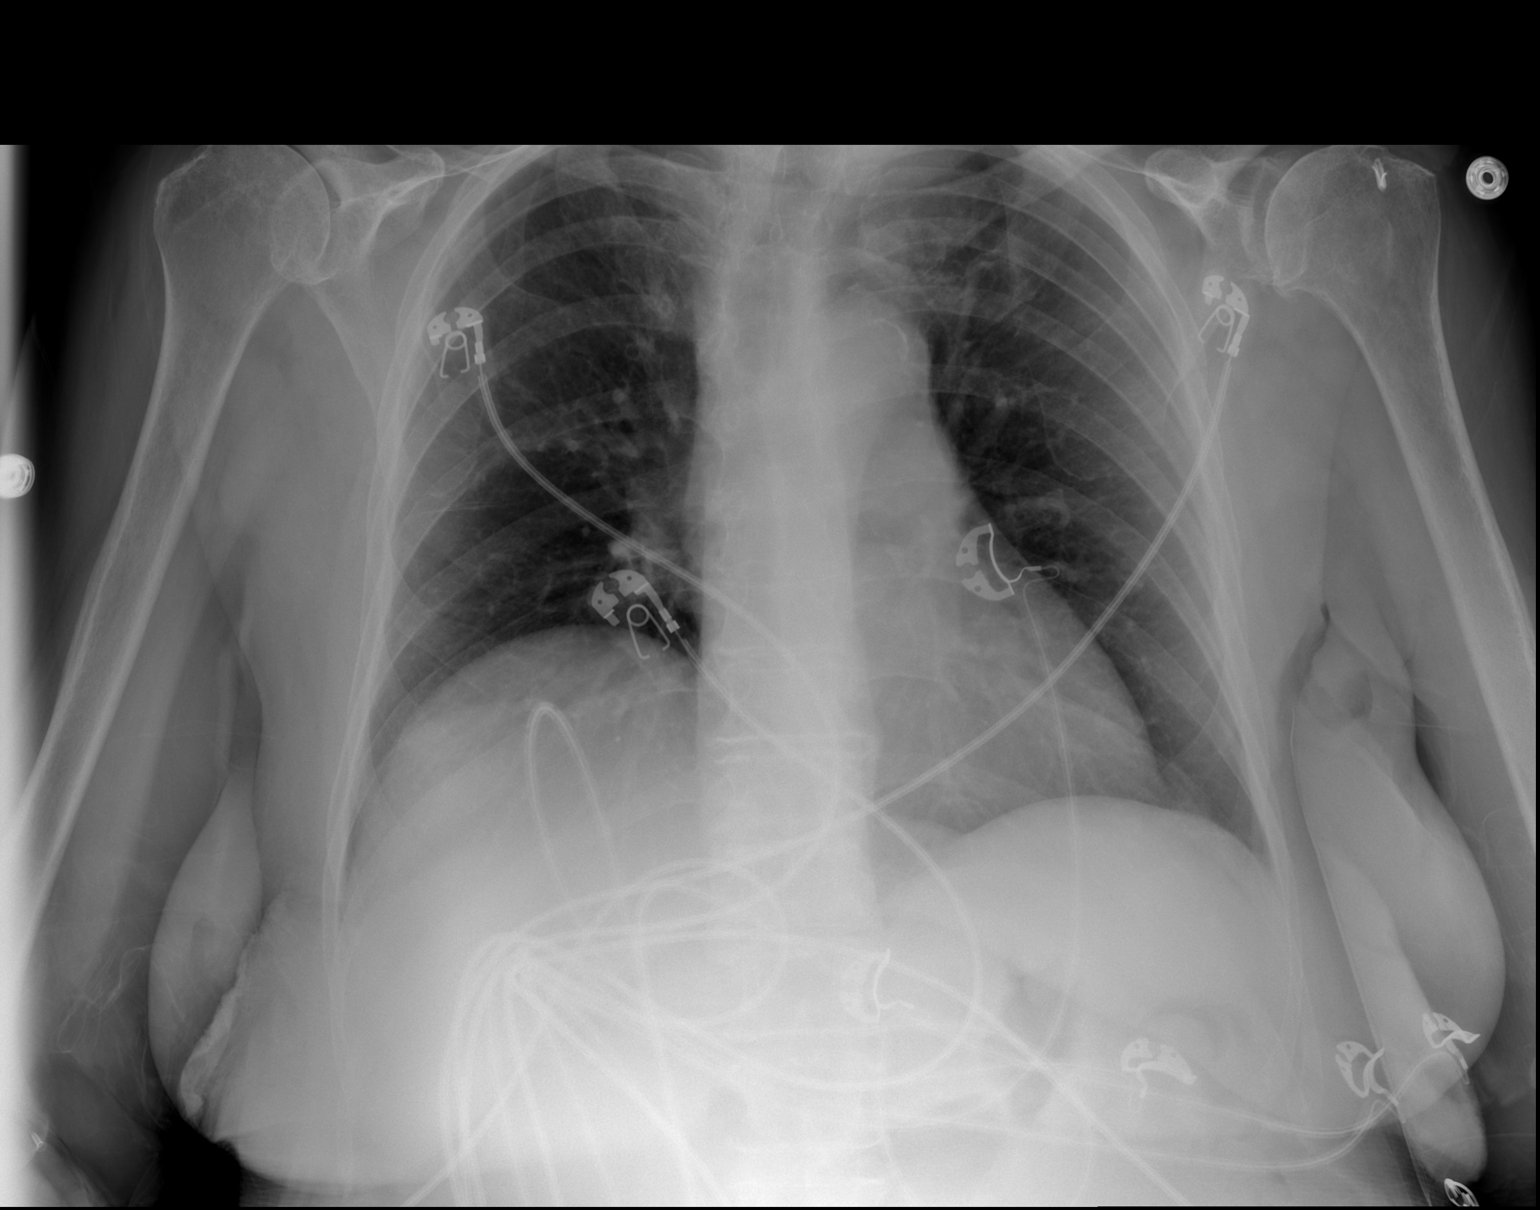

[2 of 2 positions shown; findings below may reference images not displayed]

FINDINGS: Normal heart size. Lungs are under aerated and grossly clear. No
pneumothorax. No pleural effusion.
IMPRESSION: No active cardiopulmonary disease.

## 2020-04-10 DIAGNOSIS — H34811 Central retinal vein occlusion, right eye, with macular edema: Secondary | ICD-10-CM | POA: Diagnosis not present

## 2020-04-12 DIAGNOSIS — E119 Type 2 diabetes mellitus without complications: Secondary | ICD-10-CM | POA: Diagnosis not present

## 2020-05-08 DIAGNOSIS — H3563 Retinal hemorrhage, bilateral: Secondary | ICD-10-CM | POA: Diagnosis not present

## 2020-05-08 DIAGNOSIS — H353221 Exudative age-related macular degeneration, left eye, with active choroidal neovascularization: Secondary | ICD-10-CM | POA: Diagnosis not present

## 2020-05-08 DIAGNOSIS — H34811 Central retinal vein occlusion, right eye, with macular edema: Secondary | ICD-10-CM | POA: Diagnosis not present

## 2020-05-08 DIAGNOSIS — H353112 Nonexudative age-related macular degeneration, right eye, intermediate dry stage: Secondary | ICD-10-CM | POA: Diagnosis not present

## 2020-05-10 DIAGNOSIS — R42 Dizziness and giddiness: Secondary | ICD-10-CM | POA: Diagnosis not present

## 2020-05-10 DIAGNOSIS — E1169 Type 2 diabetes mellitus with other specified complication: Secondary | ICD-10-CM | POA: Diagnosis not present

## 2020-05-10 DIAGNOSIS — Z79899 Other long term (current) drug therapy: Secondary | ICD-10-CM | POA: Diagnosis not present

## 2020-05-22 DIAGNOSIS — H353221 Exudative age-related macular degeneration, left eye, with active choroidal neovascularization: Secondary | ICD-10-CM | POA: Diagnosis not present

## 2020-06-05 DIAGNOSIS — H34811 Central retinal vein occlusion, right eye, with macular edema: Secondary | ICD-10-CM | POA: Diagnosis not present

## 2020-06-24 DIAGNOSIS — I10 Essential (primary) hypertension: Secondary | ICD-10-CM | POA: Diagnosis not present

## 2020-06-24 DIAGNOSIS — M6281 Muscle weakness (generalized): Secondary | ICD-10-CM | POA: Diagnosis not present

## 2020-06-24 DIAGNOSIS — E785 Hyperlipidemia, unspecified: Secondary | ICD-10-CM | POA: Diagnosis not present

## 2020-06-24 DIAGNOSIS — F0390 Unspecified dementia without behavioral disturbance: Secondary | ICD-10-CM | POA: Diagnosis not present

## 2020-06-25 DIAGNOSIS — E119 Type 2 diabetes mellitus without complications: Secondary | ICD-10-CM | POA: Diagnosis not present

## 2020-06-25 DIAGNOSIS — E1169 Type 2 diabetes mellitus with other specified complication: Secondary | ICD-10-CM | POA: Diagnosis not present

## 2020-06-25 DIAGNOSIS — E08311 Diabetes mellitus due to underlying condition with unspecified diabetic retinopathy with macular edema: Secondary | ICD-10-CM | POA: Diagnosis not present

## 2020-06-25 DIAGNOSIS — B342 Coronavirus infection, unspecified: Secondary | ICD-10-CM | POA: Diagnosis not present

## 2020-06-26 DIAGNOSIS — H353221 Exudative age-related macular degeneration, left eye, with active choroidal neovascularization: Secondary | ICD-10-CM | POA: Diagnosis not present

## 2020-07-17 DIAGNOSIS — E119 Type 2 diabetes mellitus without complications: Secondary | ICD-10-CM | POA: Diagnosis not present

## 2020-07-31 DIAGNOSIS — H34811 Central retinal vein occlusion, right eye, with macular edema: Secondary | ICD-10-CM | POA: Diagnosis not present

## 2020-08-07 DIAGNOSIS — H353221 Exudative age-related macular degeneration, left eye, with active choroidal neovascularization: Secondary | ICD-10-CM | POA: Diagnosis not present

## 2020-08-30 DIAGNOSIS — E785 Hyperlipidemia, unspecified: Secondary | ICD-10-CM | POA: Diagnosis not present

## 2020-08-30 DIAGNOSIS — I69398 Other sequelae of cerebral infarction: Secondary | ICD-10-CM | POA: Diagnosis not present

## 2020-08-30 DIAGNOSIS — E1122 Type 2 diabetes mellitus with diabetic chronic kidney disease: Secondary | ICD-10-CM | POA: Diagnosis not present

## 2020-08-30 DIAGNOSIS — I10 Essential (primary) hypertension: Secondary | ICD-10-CM | POA: Diagnosis not present

## 2020-09-02 DIAGNOSIS — E119 Type 2 diabetes mellitus without complications: Secondary | ICD-10-CM | POA: Diagnosis not present

## 2020-09-09 DIAGNOSIS — Z8673 Personal history of transient ischemic attack (TIA), and cerebral infarction without residual deficits: Secondary | ICD-10-CM | POA: Diagnosis not present

## 2020-09-09 DIAGNOSIS — I10 Essential (primary) hypertension: Secondary | ICD-10-CM | POA: Diagnosis not present

## 2020-09-11 DIAGNOSIS — R251 Tremor, unspecified: Secondary | ICD-10-CM | POA: Diagnosis not present

## 2020-09-11 DIAGNOSIS — H353221 Exudative age-related macular degeneration, left eye, with active choroidal neovascularization: Secondary | ICD-10-CM | POA: Diagnosis not present

## 2020-09-11 DIAGNOSIS — Z8673 Personal history of transient ischemic attack (TIA), and cerebral infarction without residual deficits: Secondary | ICD-10-CM | POA: Diagnosis not present

## 2020-09-11 DIAGNOSIS — F0390 Unspecified dementia without behavioral disturbance: Secondary | ICD-10-CM | POA: Diagnosis not present

## 2020-09-17 DIAGNOSIS — I10 Essential (primary) hypertension: Secondary | ICD-10-CM | POA: Diagnosis not present

## 2020-09-17 DIAGNOSIS — Z8673 Personal history of transient ischemic attack (TIA), and cerebral infarction without residual deficits: Secondary | ICD-10-CM | POA: Diagnosis not present

## 2020-09-17 DIAGNOSIS — F0390 Unspecified dementia without behavioral disturbance: Secondary | ICD-10-CM | POA: Diagnosis not present

## 2020-09-17 DIAGNOSIS — R251 Tremor, unspecified: Secondary | ICD-10-CM | POA: Diagnosis not present

## 2020-09-25 DIAGNOSIS — H34811 Central retinal vein occlusion, right eye, with macular edema: Secondary | ICD-10-CM | POA: Diagnosis not present

## 2020-10-01 DIAGNOSIS — H34811 Central retinal vein occlusion, right eye, with macular edema: Secondary | ICD-10-CM | POA: Diagnosis not present

## 2020-10-07 DIAGNOSIS — N390 Urinary tract infection, site not specified: Secondary | ICD-10-CM | POA: Diagnosis not present

## 2020-10-08 DIAGNOSIS — I10 Essential (primary) hypertension: Secondary | ICD-10-CM | POA: Diagnosis not present

## 2020-10-08 DIAGNOSIS — R251 Tremor, unspecified: Secondary | ICD-10-CM | POA: Diagnosis not present

## 2020-10-08 DIAGNOSIS — F0390 Unspecified dementia without behavioral disturbance: Secondary | ICD-10-CM | POA: Diagnosis not present

## 2020-10-23 DIAGNOSIS — H353221 Exudative age-related macular degeneration, left eye, with active choroidal neovascularization: Secondary | ICD-10-CM | POA: Diagnosis not present

## 2020-11-14 DIAGNOSIS — N390 Urinary tract infection, site not specified: Secondary | ICD-10-CM | POA: Diagnosis not present

## 2020-11-20 DIAGNOSIS — H34811 Central retinal vein occlusion, right eye, with macular edema: Secondary | ICD-10-CM | POA: Diagnosis not present

## 2020-11-26 DIAGNOSIS — F0390 Unspecified dementia without behavioral disturbance: Secondary | ICD-10-CM | POA: Diagnosis not present

## 2020-11-26 DIAGNOSIS — L03115 Cellulitis of right lower limb: Secondary | ICD-10-CM | POA: Diagnosis not present

## 2020-11-26 DIAGNOSIS — L299 Pruritus, unspecified: Secondary | ICD-10-CM | POA: Diagnosis not present

## 2020-11-27 DIAGNOSIS — E119 Type 2 diabetes mellitus without complications: Secondary | ICD-10-CM | POA: Diagnosis not present

## 2020-11-27 DIAGNOSIS — Z79899 Other long term (current) drug therapy: Secondary | ICD-10-CM | POA: Diagnosis not present

## 2020-11-28 DIAGNOSIS — F0390 Unspecified dementia without behavioral disturbance: Secondary | ICD-10-CM | POA: Diagnosis not present

## 2020-11-28 DIAGNOSIS — G250 Essential tremor: Secondary | ICD-10-CM | POA: Diagnosis not present

## 2020-12-02 DIAGNOSIS — L03119 Cellulitis of unspecified part of limb: Secondary | ICD-10-CM | POA: Diagnosis not present

## 2020-12-02 DIAGNOSIS — F0390 Unspecified dementia without behavioral disturbance: Secondary | ICD-10-CM | POA: Diagnosis not present

## 2020-12-02 DIAGNOSIS — H04123 Dry eye syndrome of bilateral lacrimal glands: Secondary | ICD-10-CM | POA: Diagnosis not present

## 2020-12-02 DIAGNOSIS — R6889 Other general symptoms and signs: Secondary | ICD-10-CM | POA: Diagnosis not present

## 2020-12-03 DIAGNOSIS — Z79899 Other long term (current) drug therapy: Secondary | ICD-10-CM | POA: Diagnosis not present

## 2020-12-03 DIAGNOSIS — E1169 Type 2 diabetes mellitus with other specified complication: Secondary | ICD-10-CM | POA: Diagnosis not present

## 2020-12-03 DIAGNOSIS — I10 Essential (primary) hypertension: Secondary | ICD-10-CM | POA: Diagnosis not present

## 2020-12-04 DIAGNOSIS — H34811 Central retinal vein occlusion, right eye, with macular edema: Secondary | ICD-10-CM | POA: Diagnosis not present

## 2020-12-04 DIAGNOSIS — H33321 Round hole, right eye: Secondary | ICD-10-CM | POA: Diagnosis not present

## 2020-12-04 DIAGNOSIS — H3561 Retinal hemorrhage, right eye: Secondary | ICD-10-CM | POA: Diagnosis not present

## 2020-12-04 DIAGNOSIS — H34231 Retinal artery branch occlusion, right eye: Secondary | ICD-10-CM | POA: Diagnosis not present

## 2021-01-02 DIAGNOSIS — Z79899 Other long term (current) drug therapy: Secondary | ICD-10-CM | POA: Diagnosis not present

## 2021-01-15 DIAGNOSIS — H34811 Central retinal vein occlusion, right eye, with macular edema: Secondary | ICD-10-CM | POA: Diagnosis not present

## 2021-01-23 DIAGNOSIS — H353221 Exudative age-related macular degeneration, left eye, with active choroidal neovascularization: Secondary | ICD-10-CM | POA: Diagnosis not present

## 2021-02-12 DIAGNOSIS — H34811 Central retinal vein occlusion, right eye, with macular edema: Secondary | ICD-10-CM | POA: Diagnosis not present

## 2021-02-25 DIAGNOSIS — F0390 Unspecified dementia without behavioral disturbance: Secondary | ICD-10-CM | POA: Diagnosis not present

## 2021-02-25 DIAGNOSIS — R410 Disorientation, unspecified: Secondary | ICD-10-CM | POA: Diagnosis not present

## 2021-02-28 DIAGNOSIS — H34811 Central retinal vein occlusion, right eye, with macular edema: Secondary | ICD-10-CM | POA: Diagnosis not present

## 2021-03-06 DIAGNOSIS — H353221 Exudative age-related macular degeneration, left eye, with active choroidal neovascularization: Secondary | ICD-10-CM | POA: Diagnosis not present

## 2021-04-10 DIAGNOSIS — K5900 Constipation, unspecified: Secondary | ICD-10-CM | POA: Diagnosis not present

## 2021-04-10 DIAGNOSIS — E1165 Type 2 diabetes mellitus with hyperglycemia: Secondary | ICD-10-CM | POA: Diagnosis not present

## 2021-04-10 DIAGNOSIS — L853 Xerosis cutis: Secondary | ICD-10-CM | POA: Diagnosis not present

## 2021-04-10 DIAGNOSIS — E785 Hyperlipidemia, unspecified: Secondary | ICD-10-CM | POA: Diagnosis not present

## 2021-04-18 DIAGNOSIS — E08311 Diabetes mellitus due to underlying condition with unspecified diabetic retinopathy with macular edema: Secondary | ICD-10-CM | POA: Diagnosis not present

## 2021-05-15 DIAGNOSIS — H353221 Exudative age-related macular degeneration, left eye, with active choroidal neovascularization: Secondary | ICD-10-CM | POA: Diagnosis not present

## 2021-05-20 DIAGNOSIS — H34811 Central retinal vein occlusion, right eye, with macular edema: Secondary | ICD-10-CM | POA: Diagnosis not present

## 2021-07-15 DIAGNOSIS — E785 Hyperlipidemia, unspecified: Secondary | ICD-10-CM | POA: Diagnosis not present

## 2021-07-15 DIAGNOSIS — K5900 Constipation, unspecified: Secondary | ICD-10-CM | POA: Diagnosis not present

## 2021-07-15 DIAGNOSIS — I10 Essential (primary) hypertension: Secondary | ICD-10-CM | POA: Diagnosis not present

## 2021-07-15 DIAGNOSIS — E1165 Type 2 diabetes mellitus with hyperglycemia: Secondary | ICD-10-CM | POA: Diagnosis not present

## 2021-07-16 DIAGNOSIS — E1169 Type 2 diabetes mellitus with other specified complication: Secondary | ICD-10-CM | POA: Diagnosis not present

## 2021-07-16 DIAGNOSIS — I10 Essential (primary) hypertension: Secondary | ICD-10-CM | POA: Diagnosis not present

## 2021-07-16 DIAGNOSIS — E559 Vitamin D deficiency, unspecified: Secondary | ICD-10-CM | POA: Diagnosis not present

## 2021-07-17 DIAGNOSIS — R41841 Cognitive communication deficit: Secondary | ICD-10-CM | POA: Diagnosis not present

## 2021-07-17 DIAGNOSIS — N1831 Chronic kidney disease, stage 3a: Secondary | ICD-10-CM | POA: Diagnosis not present

## 2021-07-17 DIAGNOSIS — E1165 Type 2 diabetes mellitus with hyperglycemia: Secondary | ICD-10-CM | POA: Diagnosis not present

## 2021-07-24 DIAGNOSIS — H34811 Central retinal vein occlusion, right eye, with macular edema: Secondary | ICD-10-CM | POA: Diagnosis not present

## 2021-07-28 DIAGNOSIS — R3981 Functional urinary incontinence: Secondary | ICD-10-CM | POA: Diagnosis not present

## 2021-07-28 DIAGNOSIS — R41841 Cognitive communication deficit: Secondary | ICD-10-CM | POA: Diagnosis not present

## 2021-07-29 DIAGNOSIS — N390 Urinary tract infection, site not specified: Secondary | ICD-10-CM | POA: Diagnosis not present

## 2021-07-29 DIAGNOSIS — H353221 Exudative age-related macular degeneration, left eye, with active choroidal neovascularization: Secondary | ICD-10-CM | POA: Diagnosis not present

## 2021-07-30 DIAGNOSIS — R41841 Cognitive communication deficit: Secondary | ICD-10-CM | POA: Diagnosis not present

## 2021-07-30 DIAGNOSIS — Z7689 Persons encountering health services in other specified circumstances: Secondary | ICD-10-CM | POA: Diagnosis not present

## 2021-08-12 DIAGNOSIS — R41841 Cognitive communication deficit: Secondary | ICD-10-CM | POA: Diagnosis not present

## 2021-08-12 DIAGNOSIS — H3562 Retinal hemorrhage, left eye: Secondary | ICD-10-CM | POA: Diagnosis not present

## 2021-08-12 DIAGNOSIS — G250 Essential tremor: Secondary | ICD-10-CM | POA: Diagnosis not present

## 2021-08-19 DIAGNOSIS — M6281 Muscle weakness (generalized): Secondary | ICD-10-CM | POA: Diagnosis not present

## 2021-08-19 DIAGNOSIS — I63532 Cerebral infarction due to unspecified occlusion or stenosis of left posterior cerebral artery: Secondary | ICD-10-CM | POA: Diagnosis not present

## 2021-08-21 DIAGNOSIS — M6281 Muscle weakness (generalized): Secondary | ICD-10-CM | POA: Diagnosis not present

## 2021-08-21 DIAGNOSIS — I63532 Cerebral infarction due to unspecified occlusion or stenosis of left posterior cerebral artery: Secondary | ICD-10-CM | POA: Diagnosis not present

## 2021-08-22 DIAGNOSIS — I63532 Cerebral infarction due to unspecified occlusion or stenosis of left posterior cerebral artery: Secondary | ICD-10-CM | POA: Diagnosis not present

## 2021-08-22 DIAGNOSIS — M6281 Muscle weakness (generalized): Secondary | ICD-10-CM | POA: Diagnosis not present

## 2021-08-26 DIAGNOSIS — I63532 Cerebral infarction due to unspecified occlusion or stenosis of left posterior cerebral artery: Secondary | ICD-10-CM | POA: Diagnosis not present

## 2021-08-26 DIAGNOSIS — M6281 Muscle weakness (generalized): Secondary | ICD-10-CM | POA: Diagnosis not present

## 2021-08-28 DIAGNOSIS — I63532 Cerebral infarction due to unspecified occlusion or stenosis of left posterior cerebral artery: Secondary | ICD-10-CM | POA: Diagnosis not present

## 2021-08-28 DIAGNOSIS — M6281 Muscle weakness (generalized): Secondary | ICD-10-CM | POA: Diagnosis not present

## 2021-08-29 DIAGNOSIS — I63532 Cerebral infarction due to unspecified occlusion or stenosis of left posterior cerebral artery: Secondary | ICD-10-CM | POA: Diagnosis not present

## 2021-08-29 DIAGNOSIS — M6281 Muscle weakness (generalized): Secondary | ICD-10-CM | POA: Diagnosis not present

## 2021-08-29 DIAGNOSIS — H34811 Central retinal vein occlusion, right eye, with macular edema: Secondary | ICD-10-CM | POA: Diagnosis not present

## 2021-09-29 DIAGNOSIS — R41841 Cognitive communication deficit: Secondary | ICD-10-CM | POA: Diagnosis not present

## 2021-09-29 DIAGNOSIS — F0390 Unspecified dementia without behavioral disturbance: Secondary | ICD-10-CM | POA: Diagnosis not present

## 2021-09-29 DIAGNOSIS — I63532 Cerebral infarction due to unspecified occlusion or stenosis of left posterior cerebral artery: Secondary | ICD-10-CM | POA: Diagnosis not present

## 2021-09-29 DIAGNOSIS — R279 Unspecified lack of coordination: Secondary | ICD-10-CM | POA: Diagnosis not present

## 2021-09-29 DIAGNOSIS — I69320 Aphasia following cerebral infarction: Secondary | ICD-10-CM | POA: Diagnosis not present

## 2021-09-30 DIAGNOSIS — H353221 Exudative age-related macular degeneration, left eye, with active choroidal neovascularization: Secondary | ICD-10-CM | POA: Diagnosis not present

## 2021-10-01 DIAGNOSIS — F0390 Unspecified dementia without behavioral disturbance: Secondary | ICD-10-CM | POA: Diagnosis not present

## 2021-10-01 DIAGNOSIS — R41841 Cognitive communication deficit: Secondary | ICD-10-CM | POA: Diagnosis not present

## 2021-10-01 DIAGNOSIS — I69320 Aphasia following cerebral infarction: Secondary | ICD-10-CM | POA: Diagnosis not present

## 2021-10-01 DIAGNOSIS — I63532 Cerebral infarction due to unspecified occlusion or stenosis of left posterior cerebral artery: Secondary | ICD-10-CM | POA: Diagnosis not present

## 2021-10-01 DIAGNOSIS — R279 Unspecified lack of coordination: Secondary | ICD-10-CM | POA: Diagnosis not present

## 2021-10-02 DIAGNOSIS — H34811 Central retinal vein occlusion, right eye, with macular edema: Secondary | ICD-10-CM | POA: Diagnosis not present

## 2021-10-03 DIAGNOSIS — I69320 Aphasia following cerebral infarction: Secondary | ICD-10-CM | POA: Diagnosis not present

## 2021-10-03 DIAGNOSIS — I63532 Cerebral infarction due to unspecified occlusion or stenosis of left posterior cerebral artery: Secondary | ICD-10-CM | POA: Diagnosis not present

## 2021-10-03 DIAGNOSIS — F0390 Unspecified dementia without behavioral disturbance: Secondary | ICD-10-CM | POA: Diagnosis not present

## 2021-10-03 DIAGNOSIS — R41841 Cognitive communication deficit: Secondary | ICD-10-CM | POA: Diagnosis not present

## 2021-10-06 DIAGNOSIS — F0390 Unspecified dementia without behavioral disturbance: Secondary | ICD-10-CM | POA: Diagnosis not present

## 2021-10-06 DIAGNOSIS — I69320 Aphasia following cerebral infarction: Secondary | ICD-10-CM | POA: Diagnosis not present

## 2021-10-06 DIAGNOSIS — I63532 Cerebral infarction due to unspecified occlusion or stenosis of left posterior cerebral artery: Secondary | ICD-10-CM | POA: Diagnosis not present

## 2021-10-06 DIAGNOSIS — R41841 Cognitive communication deficit: Secondary | ICD-10-CM | POA: Diagnosis not present

## 2021-10-08 DIAGNOSIS — R41841 Cognitive communication deficit: Secondary | ICD-10-CM | POA: Diagnosis not present

## 2021-10-08 DIAGNOSIS — I63532 Cerebral infarction due to unspecified occlusion or stenosis of left posterior cerebral artery: Secondary | ICD-10-CM | POA: Diagnosis not present

## 2021-10-08 DIAGNOSIS — F0390 Unspecified dementia without behavioral disturbance: Secondary | ICD-10-CM | POA: Diagnosis not present

## 2021-10-08 DIAGNOSIS — I69320 Aphasia following cerebral infarction: Secondary | ICD-10-CM | POA: Diagnosis not present

## 2021-10-10 DIAGNOSIS — I63532 Cerebral infarction due to unspecified occlusion or stenosis of left posterior cerebral artery: Secondary | ICD-10-CM | POA: Diagnosis not present

## 2021-10-10 DIAGNOSIS — I69320 Aphasia following cerebral infarction: Secondary | ICD-10-CM | POA: Diagnosis not present

## 2021-10-10 DIAGNOSIS — R41841 Cognitive communication deficit: Secondary | ICD-10-CM | POA: Diagnosis not present

## 2021-10-10 DIAGNOSIS — F0390 Unspecified dementia without behavioral disturbance: Secondary | ICD-10-CM | POA: Diagnosis not present

## 2021-10-13 DIAGNOSIS — F0390 Unspecified dementia without behavioral disturbance: Secondary | ICD-10-CM | POA: Diagnosis not present

## 2021-10-13 DIAGNOSIS — R41841 Cognitive communication deficit: Secondary | ICD-10-CM | POA: Diagnosis not present

## 2021-10-13 DIAGNOSIS — I69320 Aphasia following cerebral infarction: Secondary | ICD-10-CM | POA: Diagnosis not present

## 2021-10-13 DIAGNOSIS — I63532 Cerebral infarction due to unspecified occlusion or stenosis of left posterior cerebral artery: Secondary | ICD-10-CM | POA: Diagnosis not present

## 2021-10-16 DIAGNOSIS — I63532 Cerebral infarction due to unspecified occlusion or stenosis of left posterior cerebral artery: Secondary | ICD-10-CM | POA: Diagnosis not present

## 2021-10-16 DIAGNOSIS — I69320 Aphasia following cerebral infarction: Secondary | ICD-10-CM | POA: Diagnosis not present

## 2021-10-16 DIAGNOSIS — R41841 Cognitive communication deficit: Secondary | ICD-10-CM | POA: Diagnosis not present

## 2021-10-16 DIAGNOSIS — F0390 Unspecified dementia without behavioral disturbance: Secondary | ICD-10-CM | POA: Diagnosis not present

## 2021-10-17 DIAGNOSIS — I63532 Cerebral infarction due to unspecified occlusion or stenosis of left posterior cerebral artery: Secondary | ICD-10-CM | POA: Diagnosis not present

## 2021-10-17 DIAGNOSIS — F0390 Unspecified dementia without behavioral disturbance: Secondary | ICD-10-CM | POA: Diagnosis not present

## 2021-10-17 DIAGNOSIS — R41841 Cognitive communication deficit: Secondary | ICD-10-CM | POA: Diagnosis not present

## 2021-10-17 DIAGNOSIS — I69320 Aphasia following cerebral infarction: Secondary | ICD-10-CM | POA: Diagnosis not present

## 2021-10-20 DIAGNOSIS — I69320 Aphasia following cerebral infarction: Secondary | ICD-10-CM | POA: Diagnosis not present

## 2021-10-20 DIAGNOSIS — I63532 Cerebral infarction due to unspecified occlusion or stenosis of left posterior cerebral artery: Secondary | ICD-10-CM | POA: Diagnosis not present

## 2021-10-20 DIAGNOSIS — R41841 Cognitive communication deficit: Secondary | ICD-10-CM | POA: Diagnosis not present

## 2021-10-20 DIAGNOSIS — F0390 Unspecified dementia without behavioral disturbance: Secondary | ICD-10-CM | POA: Diagnosis not present

## 2021-10-22 DIAGNOSIS — F0390 Unspecified dementia without behavioral disturbance: Secondary | ICD-10-CM | POA: Diagnosis not present

## 2021-10-22 DIAGNOSIS — I69320 Aphasia following cerebral infarction: Secondary | ICD-10-CM | POA: Diagnosis not present

## 2021-10-22 DIAGNOSIS — R41841 Cognitive communication deficit: Secondary | ICD-10-CM | POA: Diagnosis not present

## 2021-10-22 DIAGNOSIS — I63532 Cerebral infarction due to unspecified occlusion or stenosis of left posterior cerebral artery: Secondary | ICD-10-CM | POA: Diagnosis not present

## 2021-10-24 DIAGNOSIS — I69320 Aphasia following cerebral infarction: Secondary | ICD-10-CM | POA: Diagnosis not present

## 2021-10-24 DIAGNOSIS — F0390 Unspecified dementia without behavioral disturbance: Secondary | ICD-10-CM | POA: Diagnosis not present

## 2021-10-24 DIAGNOSIS — R41841 Cognitive communication deficit: Secondary | ICD-10-CM | POA: Diagnosis not present

## 2021-10-24 DIAGNOSIS — I63532 Cerebral infarction due to unspecified occlusion or stenosis of left posterior cerebral artery: Secondary | ICD-10-CM | POA: Diagnosis not present

## 2021-11-03 DIAGNOSIS — H34811 Central retinal vein occlusion, right eye, with macular edema: Secondary | ICD-10-CM | POA: Diagnosis not present

## 2021-11-05 DIAGNOSIS — L853 Xerosis cutis: Secondary | ICD-10-CM | POA: Diagnosis not present

## 2021-11-05 DIAGNOSIS — K5900 Constipation, unspecified: Secondary | ICD-10-CM | POA: Diagnosis not present

## 2021-11-05 DIAGNOSIS — E785 Hyperlipidemia, unspecified: Secondary | ICD-10-CM | POA: Diagnosis not present

## 2021-11-05 DIAGNOSIS — E1165 Type 2 diabetes mellitus with hyperglycemia: Secondary | ICD-10-CM | POA: Diagnosis not present

## 2021-11-19 DIAGNOSIS — R41841 Cognitive communication deficit: Secondary | ICD-10-CM | POA: Diagnosis not present

## 2021-11-19 DIAGNOSIS — R410 Disorientation, unspecified: Secondary | ICD-10-CM | POA: Diagnosis not present

## 2021-11-20 ENCOUNTER — Emergency Department (HOSPITAL_COMMUNITY): Payer: Medicare Other

## 2021-11-20 ENCOUNTER — Inpatient Hospital Stay (HOSPITAL_COMMUNITY)
Admission: EM | Admit: 2021-11-20 | Discharge: 2021-11-22 | DRG: 071 | Disposition: A | Discharge status: Skilled Nursing Facility | Payer: Medicare Other | Source: Skilled Nursing Facility | Attending: Internal Medicine | Admitting: Internal Medicine

## 2021-11-20 ENCOUNTER — Encounter (HOSPITAL_COMMUNITY): Payer: Self-pay

## 2021-11-20 ENCOUNTER — Other Ambulatory Visit: Payer: Self-pay

## 2021-11-20 DIAGNOSIS — I130 Hypertensive heart and chronic kidney disease with heart failure and stage 1 through stage 4 chronic kidney disease, or unspecified chronic kidney disease: Secondary | ICD-10-CM | POA: Diagnosis present

## 2021-11-20 DIAGNOSIS — R400 Somnolence: Secondary | ICD-10-CM | POA: Diagnosis not present

## 2021-11-20 DIAGNOSIS — I10 Essential (primary) hypertension: Secondary | ICD-10-CM | POA: Diagnosis present

## 2021-11-20 DIAGNOSIS — R4182 Altered mental status, unspecified: Secondary | ICD-10-CM | POA: Diagnosis not present

## 2021-11-20 DIAGNOSIS — M2578 Osteophyte, vertebrae: Secondary | ICD-10-CM | POA: Diagnosis not present

## 2021-11-20 DIAGNOSIS — N390 Urinary tract infection, site not specified: Secondary | ICD-10-CM | POA: Diagnosis not present

## 2021-11-20 DIAGNOSIS — Z7984 Long term (current) use of oral hypoglycemic drugs: Secondary | ICD-10-CM

## 2021-11-20 DIAGNOSIS — F0390 Unspecified dementia without behavioral disturbance: Secondary | ICD-10-CM | POA: Diagnosis present

## 2021-11-20 DIAGNOSIS — E782 Mixed hyperlipidemia: Secondary | ICD-10-CM

## 2021-11-20 DIAGNOSIS — Z7982 Long term (current) use of aspirin: Secondary | ICD-10-CM

## 2021-11-20 DIAGNOSIS — Z823 Family history of stroke: Secondary | ICD-10-CM

## 2021-11-20 DIAGNOSIS — Z515 Encounter for palliative care: Secondary | ICD-10-CM

## 2021-11-20 DIAGNOSIS — Z9181 History of falling: Secondary | ICD-10-CM

## 2021-11-20 DIAGNOSIS — Z8673 Personal history of transient ischemic attack (TIA), and cerebral infarction without residual deficits: Secondary | ICD-10-CM

## 2021-11-20 DIAGNOSIS — E860 Dehydration: Secondary | ICD-10-CM | POA: Diagnosis present

## 2021-11-20 DIAGNOSIS — Z79899 Other long term (current) drug therapy: Secondary | ICD-10-CM

## 2021-11-20 DIAGNOSIS — Z043 Encounter for examination and observation following other accident: Secondary | ICD-10-CM | POA: Diagnosis not present

## 2021-11-20 DIAGNOSIS — R52 Pain, unspecified: Secondary | ICD-10-CM | POA: Diagnosis not present

## 2021-11-20 DIAGNOSIS — E785 Hyperlipidemia, unspecified: Secondary | ICD-10-CM | POA: Diagnosis not present

## 2021-11-20 DIAGNOSIS — M4312 Spondylolisthesis, cervical region: Secondary | ICD-10-CM | POA: Diagnosis not present

## 2021-11-20 DIAGNOSIS — R300 Dysuria: Secondary | ICD-10-CM | POA: Diagnosis not present

## 2021-11-20 DIAGNOSIS — Z1152 Encounter for screening for COVID-19: Secondary | ICD-10-CM | POA: Diagnosis not present

## 2021-11-20 DIAGNOSIS — E1122 Type 2 diabetes mellitus with diabetic chronic kidney disease: Secondary | ICD-10-CM | POA: Diagnosis present

## 2021-11-20 DIAGNOSIS — Z8249 Family history of ischemic heart disease and other diseases of the circulatory system: Secondary | ICD-10-CM | POA: Diagnosis not present

## 2021-11-20 DIAGNOSIS — D72828 Other elevated white blood cell count: Secondary | ICD-10-CM | POA: Diagnosis not present

## 2021-11-20 DIAGNOSIS — W19XXXA Unspecified fall, initial encounter: Secondary | ICD-10-CM | POA: Diagnosis present

## 2021-11-20 DIAGNOSIS — N179 Acute kidney failure, unspecified: Secondary | ICD-10-CM | POA: Diagnosis present

## 2021-11-20 DIAGNOSIS — G9341 Metabolic encephalopathy: Principal | ICD-10-CM | POA: Diagnosis present

## 2021-11-20 DIAGNOSIS — N1832 Chronic kidney disease, stage 3b: Secondary | ICD-10-CM | POA: Diagnosis not present

## 2021-11-20 DIAGNOSIS — I5032 Chronic diastolic (congestive) heart failure: Secondary | ICD-10-CM | POA: Diagnosis not present

## 2021-11-20 DIAGNOSIS — Z66 Do not resuscitate: Secondary | ICD-10-CM | POA: Diagnosis not present

## 2021-11-20 DIAGNOSIS — E119 Type 2 diabetes mellitus without complications: Secondary | ICD-10-CM

## 2021-11-20 DIAGNOSIS — I2510 Atherosclerotic heart disease of native coronary artery without angina pectoris: Secondary | ICD-10-CM | POA: Diagnosis not present

## 2021-11-20 DIAGNOSIS — M47816 Spondylosis without myelopathy or radiculopathy, lumbar region: Secondary | ICD-10-CM | POA: Diagnosis not present

## 2021-11-20 DIAGNOSIS — Z87440 Personal history of urinary (tract) infections: Secondary | ICD-10-CM | POA: Diagnosis not present

## 2021-11-20 DIAGNOSIS — R42 Dizziness and giddiness: Secondary | ICD-10-CM | POA: Diagnosis not present

## 2021-11-20 DIAGNOSIS — R41841 Cognitive communication deficit: Secondary | ICD-10-CM | POA: Diagnosis not present

## 2021-11-20 HISTORY — DX: Metabolic encephalopathy: G93.41

## 2021-11-20 HISTORY — DX: Urinary tract infection, site not specified: N39.0

## 2021-11-20 LAB — URINALYSIS, ROUTINE W REFLEX MICROSCOPIC
Bilirubin Urine: NEGATIVE
Glucose, UA: 50 mg/dL — AB
Ketones, ur: NEGATIVE mg/dL
Nitrite: NEGATIVE
Protein, ur: 100 mg/dL — AB
Specific Gravity, Urine: 1.014 (ref 1.005–1.030)
pH: 5 (ref 5.0–8.0)

## 2021-11-20 LAB — BASIC METABOLIC PANEL
Anion gap: 10 (ref 5–15)
BUN: 20 mg/dL (ref 8–23)
CO2: 27 mmol/L (ref 22–32)
Calcium: 10.2 mg/dL (ref 8.9–10.3)
Chloride: 102 mmol/L (ref 98–111)
Creatinine, Ser: 1.06 mg/dL — ABNORMAL HIGH (ref 0.44–1.00)
GFR, Estimated: 49 mL/min — ABNORMAL LOW (ref >60)
Glucose, Bld: 143 mg/dL — ABNORMAL HIGH (ref 70–99)
Potassium: 3.6 mmol/L (ref 3.5–5.1)
Sodium: 139 mmol/L (ref 135–145)

## 2021-11-20 LAB — TROPONIN I (HIGH SENSITIVITY)
Troponin I (High Sensitivity): 10 ng/L (ref <18)
Troponin I (High Sensitivity): 10 ng/L (ref <18)

## 2021-11-20 LAB — LACTIC ACID, PLASMA: Lactic Acid, Venous: 1.4 mmol/L (ref 0.5–1.9)

## 2021-11-20 LAB — I-STAT VENOUS BLOOD GAS, ED
Acid-Base Excess: 5 mmol/L — ABNORMAL HIGH (ref 0.0–2.0)
Bicarbonate: 27.7 mmol/L (ref 20.0–28.0)
Calcium, Ion: 1.15 mmol/L (ref 1.15–1.40)
HCT: 38 % (ref 36.0–46.0)
Hemoglobin: 12.9 g/dL (ref 12.0–15.0)
O2 Saturation: 100 %
Potassium: 3.3 mmol/L — ABNORMAL LOW (ref 3.5–5.1)
Sodium: 138 mmol/L (ref 135–145)
TCO2: 29 mmol/L (ref 22–32)
pCO2, Ven: 34.9 mmHg — ABNORMAL LOW (ref 44–60)
pH, Ven: 7.508 — ABNORMAL HIGH (ref 7.25–7.43)
pO2, Ven: 152 mmHg — ABNORMAL HIGH (ref 32–45)

## 2021-11-20 LAB — CBC WITH DIFFERENTIAL/PLATELET
Abs Immature Granulocytes: 0.03 10*3/uL (ref 0.00–0.07)
Basophils Absolute: 0.1 10*3/uL (ref 0.0–0.1)
Basophils Relative: 1 %
Eosinophils Absolute: 0.4 10*3/uL (ref 0.0–0.5)
Eosinophils Relative: 3 %
HCT: 41.7 % (ref 36.0–46.0)
Hemoglobin: 14.5 g/dL (ref 12.0–15.0)
Immature Granulocytes: 0 %
Lymphocytes Relative: 33 %
Lymphs Abs: 3.5 10*3/uL (ref 0.7–4.0)
MCH: 30.4 pg (ref 26.0–34.0)
MCHC: 34.8 g/dL (ref 30.0–36.0)
MCV: 87.4 fL (ref 80.0–100.0)
Monocytes Absolute: 1 10*3/uL (ref 0.1–1.0)
Monocytes Relative: 9 %
Neutro Abs: 5.7 10*3/uL (ref 1.7–7.7)
Neutrophils Relative %: 54 %
Platelets: 296 10*3/uL (ref 150–400)
RBC: 4.77 MIL/uL (ref 3.87–5.11)
RDW: 12.6 % (ref 11.5–15.5)
WBC: 10.7 10*3/uL — ABNORMAL HIGH (ref 4.0–10.5)
nRBC: 0 % (ref 0.0–0.2)

## 2021-11-20 LAB — HEPATIC FUNCTION PANEL
ALT: 23 U/L (ref 0–44)
AST: 27 U/L (ref 15–41)
Albumin: 3.1 g/dL — ABNORMAL LOW (ref 3.5–5.0)
Alkaline Phosphatase: 34 U/L — ABNORMAL LOW (ref 38–126)
Bilirubin, Direct: <0.1 mg/dL (ref 0.0–0.2)
Total Bilirubin: 0.5 mg/dL (ref 0.3–1.2)
Total Protein: 6.6 g/dL (ref 6.5–8.1)

## 2021-11-20 LAB — CREATININE, URINE, RANDOM: Creatinine, Urine: 104 mg/dL

## 2021-11-20 LAB — MAGNESIUM: Magnesium: 1.9 mg/dL (ref 1.7–2.4)

## 2021-11-20 LAB — PHOSPHORUS: Phosphorus: 2.9 mg/dL (ref 2.5–4.6)

## 2021-11-20 LAB — CK: Total CK: 72 U/L (ref 38–234)

## 2021-11-20 LAB — SARS CORONAVIRUS 2 BY RT PCR: SARS Coronavirus 2 by RT PCR: NEGATIVE

## 2021-11-20 LAB — SODIUM, URINE, RANDOM: Sodium, Ur: 77 mmol/L

## 2021-11-20 LAB — CBG MONITORING, ED: Glucose-Capillary: 141 mg/dL — ABNORMAL HIGH (ref 70–99)

## 2021-11-20 LAB — OSMOLALITY, URINE: Osmolality, Ur: 467 mOsm/kg (ref 300–900)

## 2021-11-20 MED ORDER — ASPIRIN 81 MG PO TBEC
81.0000 mg | DELAYED_RELEASE_TABLET | Freq: Every day | ORAL | Status: DC
  Administered 2021-11-21 – 2021-11-22 (×2): 81 mg via ORAL
  Filled 2021-11-20 (×2): qty 1

## 2021-11-20 MED ORDER — AMLODIPINE BESYLATE 5 MG PO TABS
5.0000 mg | ORAL_TABLET | Freq: Every day | ORAL | Status: DC
  Administered 2021-11-21 – 2021-11-22 (×2): 5 mg via ORAL
  Filled 2021-11-20 (×2): qty 1

## 2021-11-20 MED ORDER — ACETAMINOPHEN 325 MG PO TABS
650.0000 mg | ORAL_TABLET | Freq: Four times a day (QID) | ORAL | Status: DC | PRN
  Administered 2021-11-21: 650 mg via ORAL
  Filled 2021-11-20: qty 2

## 2021-11-20 MED ORDER — HYDROCODONE-ACETAMINOPHEN 5-325 MG PO TABS: 1.0000 | ORAL_TABLET | ORAL | Status: DC | PRN

## 2021-11-20 MED ORDER — INSULIN ASPART 100 UNIT/ML IJ SOLN
0.0000 [IU] | INTRAMUSCULAR | Status: DC
  Administered 2021-11-20 – 2021-11-21 (×2): 1 [IU] via SUBCUTANEOUS
  Administered 2021-11-21: 2 [IU] via SUBCUTANEOUS
  Administered 2021-11-21 (×2): 1 [IU] via SUBCUTANEOUS
  Administered 2021-11-21: 2 [IU] via SUBCUTANEOUS
  Administered 2021-11-22 (×2): 5 [IU] via SUBCUTANEOUS

## 2021-11-20 MED ORDER — ASPIRIN 81 MG PO TBEC: 81.0000 mg | DELAYED_RELEASE_TABLET | Freq: Every day | ORAL | Status: DC

## 2021-11-20 MED ORDER — SODIUM CHLORIDE 0.9 % IV SOLN
1.0000 g | Freq: Once | INTRAVENOUS | Status: AC
  Administered 2021-11-20: 1 g via INTRAVENOUS
  Filled 2021-11-20: qty 10

## 2021-11-20 MED ORDER — FENOFIBRATE 160 MG PO TABS
160.0000 mg | ORAL_TABLET | Freq: Every day | ORAL | Status: DC
  Administered 2021-11-21 – 2021-11-22 (×2): 160 mg via ORAL
  Filled 2021-11-20 (×2): qty 1

## 2021-11-20 MED ORDER — INSULIN GLARGINE-YFGN 100 UNIT/ML ~~LOC~~ SOLN
5.0000 [IU] | Freq: Every day | SUBCUTANEOUS | Status: DC
  Administered 2021-11-20 – 2021-11-21 (×2): 5 [IU] via SUBCUTANEOUS
  Filled 2021-11-20 (×3): qty 0.05

## 2021-11-20 MED ORDER — ACETAMINOPHEN 650 MG RE SUPP: 650.0000 mg | Freq: Four times a day (QID) | RECTAL | Status: DC | PRN

## 2021-11-20 MED ORDER — SODIUM CHLORIDE 0.9 % IV SOLN: INTRAVENOUS | Status: AC

## 2021-11-20 MED ORDER — AMLODIPINE BESYLATE 5 MG PO TABS: 5.0000 mg | ORAL_TABLET | Freq: Every day | ORAL | Status: DC

## 2021-11-20 MED ORDER — SODIUM CHLORIDE 0.9 % IV SOLN
1.0000 g | INTRAVENOUS | Status: DC
  Administered 2021-11-21: 1 g via INTRAVENOUS
  Filled 2021-11-20: qty 10

## 2021-11-20 NOTE — Assessment & Plan Note (Signed)
Continue Norvasc 5 mg p.o. daily

## 2021-11-20 NOTE — ED Notes (Signed)
This RN called lab to add on urine tests.

## 2021-11-20 NOTE — ED Provider Notes (Signed)
[facility] EMERGENCY DEPARTMENT Provider Note   CSN: [id number] Arrival date & time: 11/20/21  1450     History  Chief Complaint  Patient presents with   Fall   Altered Mental Status    Natalie Peterson is a 88 y.o. female.  88 year old female with prior medical history detailed below presents for evaluation.  Patient without DNR.  Patient arrives from her facility after unwitnessed fall this morning.  Patient with history of dementia.  She is unable to provide significant details as to the events leading to her arrival here.  EMS reports that patient had unwitnessed fall.  She may have struck her head.  Facility staff reported that she was "not acting same" after the fall.  Patient is not on blood thinners.  She does take aspirin daily.  Additional history obtained from the patient's daughter Natalie Peterson, [phone], who is also the patient's POA.  Daughter reports that she talk to her mother earlier this week.  Patient with gradually increasing confusion since Tuesday.  Patient's daughter is concerned about possible UTI.  Additional prior medical history includes dementia, hypertension, diabetes, and stroke.  The history is provided by the patient and medical records.  Fall This is a new problem. The current episode started 6 to 12 hours ago. The problem occurs rarely. The problem has not changed since onset.Pertinent negatives include no chest pain, no abdominal pain and no headaches.  Altered Mental Status Associated symptoms: no abdominal pain and no headaches        Home Medications Prior to Admission medications   Medication Sig Start Date End Date Taking? Authorizing Provider  acetaminophen (TYLENOL) 500 MG tablet Take 500 mg by mouth every 4 (four) hours as needed for mild pain.    [provider]  ammonium lactate (LAC-HYDRIN) 12 % lotion Apply at bedtime to legs, feet but not to toes. 12/29/17   Helane Gunther, DPM  aspirin  325 MG tablet Take 1 tablet (325 mg total) by mouth daily. 04/04/18   Tyrone Nine, MD  Cholecalciferol (VITAMIN D3) 50 MCG (2000 UT) TABS Take by mouth daily.    [provider]  cloNIDine (CATAPRES) 0.1 MG tablet  08/24/18   [provider]  fenofibrate (TRICOR) 48 MG tablet  12/25/18   [provider]  fluocinonide (LIDEX) 0.05 % external solution Apply 1 application topically 2 (two) times daily as needed (scalp).    [provider]  JANUVIA 100 MG tablet Take 100 mg by mouth daily.  03/03/18   [provider]  ketoconazole (NIZORAL) 2 % cream Apply 1 application topically 2 (two) times a week. Two times weekly    [provider]  Melatonin 5 MG CAPS Take by mouth daily.    [provider]  metFORMIN (GLUCOPHAGE) 500 MG tablet  03/16/19   [provider]  Multiple Vitamins-Minerals (CEROVITE PO) Take by mouth daily.    [provider]  Polyethyl Glycol-Propyl Glycol (SYSTANE ULTRA) 0.4-0.3 % SOLN Apply 1 drop to eye 4 (four) times daily as needed (dry eyes/eye irritation).    [provider]  Skin Protectants, Misc. (MINERIN CREME EX) Apply topically daily.    [provider]      Allergies    Patient has no known allergies.    Review of Systems   Review of Systems  Cardiovascular:  Negative for chest pain.  Gastrointestinal:  Negative for abdominal pain.  Neurological:  Negative for headaches.  All other systems reviewed and are negative.   Physical Exam Updated Vital Signs BP (!) 149/65 (BP Location: Left Arm)   Pulse 69   Temp 98 F (36.7 C) (Oral)   Resp 18   Wt 66.3 kg   SpO2 96%  Physical Exam Vitals and nursing note reviewed.  Constitutional:      General: She is not in acute distress.    Appearance: Normal appearance. She is well-developed.  HENT:     Head: Normocephalic and atraumatic.  Eyes:     Conjunctiva/sclera: Conjunctivae normal.     Pupils: Pupils are equal,  round, and reactive to light.  Cardiovascular:     Rate and Rhythm: Normal rate and regular rhythm.     Heart sounds: Normal heart sounds.  Pulmonary:     Effort: Pulmonary effort is normal. No respiratory distress.     Breath sounds: Normal breath sounds.  Abdominal:     General: There is no distension.     Palpations: Abdomen is soft.     Tenderness: There is no abdominal tenderness.  Musculoskeletal:        General: No deformity. Normal range of motion.     Cervical back: Normal range of motion and neck supple.  Skin:    General: Skin is warm and dry.  Neurological:     General: No focal deficit present.     Mental Status: She is alert.     Comments: Alert, oriented to person.  Baseline with dementia.  Normal speech No focal weakness noted. No facial droop.     ED Results / Procedures / Treatments   Labs (all labs ordered are listed, but only abnormal results are displayed) Labs Reviewed  CBC WITH DIFFERENTIAL/PLATELET  BASIC METABOLIC PANEL  URINALYSIS, ROUTINE W REFLEX MICROSCOPIC  TROPONIN I (HIGH SENSITIVITY)    EKG None  Radiology No results found.  Procedures Procedures    Medications Ordered in ED Medications - No data to display  ED Course/ Medical Decision Making/ A&P                           Medical Decision Making Amount and/or Complexity of Data Reviewed Labs: ordered. Radiology: ordered.    Medical Screen Complete  This patient presented to the ED with complaint of unwitnessed fall, AMS, suspected UTI.  This complaint involves an extensive number of treatment options. The initial differential diagnosis includes, but is not limited to, trauma from fall, cardiac event, arrhythmia, metabolic abnormality, infection  This presentation is: Acute, Chronic, Self-Limited, Previously Undiagnosed, Uncertain Prognosis, Complicated, Systemic Symptoms, and Threat to Life/Bodily Function   Patient with history of dementia, stroke, hypertension,  diabetes presents after unwitnessed fall.  Facility [facility]) reports that patient had a fall sometime this morning.  Patient is reportedly less responsive than normal.  Details of fall are unable to be elicited from staff or from the patient.  Initial exam is not suggestive of significant traumatic event.  Imaging obtained is without significant abnormality.  Screening labs obtained are suggestive of possible UTI.  Additional history obtained through conversation with patient's daughter over the phone.  Patient's daughter reports that patient with worsening confusion over the last 48 hours.  Patient's daughter reports the patient was apparently complaining of dysuria or similar symptoms as well.  Patient daughter was concerned about possibility of UTI.  Patient's daughter Natalie Peterson [phone] )has POA.  She confirms patient's DNR status.  Given  presentation patient would benefit from IV antibiotics for possible UTI.  Overnight observation would be appropriate.  Hospitalist service aware of case and will consult.     Co morbidities that complicated the patient's evaluation  Dementia, stroke, diabetes, hypertension   Additional history obtained:  Additional history obtained from EMS External records from outside sources obtained and reviewed including prior ED visits and prior Inpatient records.    Lab Tests:  I ordered and personally interpreted labs.  The pertinent results include: CBC, BMP, UA, troponin,   Imaging Studies ordered:  I ordered imaging studies including chest x-ray, pelvis x-ray, CT head, CT C-spine I independently visualized and interpreted obtained imaging which showed NAD I agree with the radiologist interpretation.   Cardiac Monitoring:  The patient was maintained on a cardiac monitor.  I personally viewed and interpreted the cardiac monitor which showed an underlying rhythm of: NSR   Medicines ordered:  I ordered medication  including Rocephin for suspected UTI Reevaluation of the patient after these medicines showed that the patient: improved   Problem List / ED Course:  Unwitnessed fall, suspected injury, AMS, suspected UTI   Reevaluation:  After the interventions noted above, I reevaluated the patient and found that they have: stayed the same   Disposition:  After consideration of the diagnostic results and the patients response to treatment, I feel that the patent would benefit from admission.          Final Clinical Impression(s) / ED Diagnoses Final diagnoses:  Altered mental status, unspecified altered mental status type  Fall, initial encounter    Rx / DC Orders ED Discharge Orders     None         Wynetta Fines, MD 11/20/21 [phone]

## 2021-11-20 NOTE — Assessment & Plan Note (Signed)
Continue Tricor 48 mg p.o. daily

## 2021-11-20 NOTE — H&P (Signed)
Natalie Peterson [id number] DOB: 1926/12/15 DOA: 11/20/2021     PCP: Lajean Manes, MD      Patient arrived to ER on 11/20/21 at 1450 Referred by Attending Valarie Merino, MD   Patient coming from:     From facility [facility]  Chief Complaint:  Confusion Chief Complaint  Patient presents with   Fall   Altered Mental Status    HPI: Natalie Peterson is a 88 y.o. female with medical history significant of dementia Frequent UTIs, history of stroke, hypertension hyperlipidemia diabetes  Presented with unwitnessed fall and confusion Brought in by EMS from [facility] Noted to have a fall which was unwitnessed.  Noted to be somewhat more confused after the fall although as per discussion with family patient has been even confused yesterday. Also reporting some dysuria At this point patient unable to provide detailed history. Daughter states that she has similar symptoms when she has UTI       Regarding pertinent Chronic problems:     Hyperlipidemia -  on Tricor Lipid Panel     Component Value Date/Time   CHOL 188 04/03/2018 0802   TRIG 218 (H) 04/03/2018 0802   HDL 34 (L) 04/03/2018 0802   CHOLHDL 5.5 04/03/2018 0802   VLDL 44 (H) 04/03/2018 0802   LDLCALC 110 (H) 04/03/2018 0802     HTN on clonidine   chronic CHF diastolic - last echo [id number] showing grade 1 diastolic dysfunction and preserved EF   DM 2 -  Lab Results  Component Value Date   HGBA1C 8.2 (H) 04/03/2018  On lantus       Hx of CVA -  with/out residual deficits on Aspirin  81    Dementia -not on meds    While in ER:   Noted to have possible UTI Discussed with family who wishes for patient to be observed   Ordered  CT HEAD/neckNo acute intracranial pathology. 2. Old left parietal lobe infarct. 3. No acute fracture or subluxation of the cervical spine Pelvic x-ray no fracture CXR -  NON acute   Following Medications were ordered in ER: Medications  cefTRIAXone  (ROCEPHIN) 1 g in sodium chloride 0.9 % 100 mL IVPB (1 g Intravenous New Bag/Given 11/20/21 1848)       ED Triage Vitals  Enc Vitals Group     BP 11/20/21 1505 (!) 149/65     Pulse Rate 11/20/21 1505 69     Resp 11/20/21 1505 18     Temp 11/20/21 1505 98 F (36.7 C)     Temp Source 11/20/21 1505 Oral     SpO2 11/20/21 1505 96 %     Weight 11/20/21 1455 146 lb 2.6 oz (66.3 kg)     Height --      Head Circumference --      Peak Flow --      Pain Score --      Pain Loc --      Pain Edu? --      Excl. in [place]? --   TMAX(24)@     _________________________________________ Significant initial  Findings: Abnormal Labs Reviewed  CBC WITH DIFFERENTIAL/PLATELET - Abnormal; Notable for the following components:      Result Value   WBC 10.7 (*)    All other components within normal limits  BASIC METABOLIC PANEL - Abnormal; Notable for the following components:   Glucose, Bld 143 (*)    Creatinine, Ser 1.06 (*)    GFR, Estimated 49 (*)  All other components within normal limits  URINALYSIS, ROUTINE W REFLEX MICROSCOPIC - Abnormal; Notable for the following components:   APPearance HAZY (*)    Glucose, UA 50 (*)    Hgb urine dipstick SMALL (*)    Protein, ur 100 (*)    Leukocytes,Ua LARGE (*)    Bacteria, UA RARE (*)    Non Squamous Epithelial 0-5 (*)    All other components within normal limits     _________________________ Troponin 10 ECG: Ordered Personally reviewed and interpreted by me showing: HR : 63   Sinus rhythm Atrial premature complex Prolonged PR interval Left bundle branch block QTC 444    The recent clinical data is shown below. Vitals:   11/20/21 1700 11/20/21 1715 11/20/21 1730 11/20/21 1845  BP: (!) 162/50 (!) 171/48 (!) 160/55 (!) 155/60  Pulse: 76 62 72 72  Resp: 16 18 14 13   Temp:      TempSrc:      SpO2: 98% 98% 97% 98%  Weight:        WBC     Component Value Date/Time   WBC 10.7 (H) 11/20/2021 1544   LYMPHSABS 3.5 11/20/2021 1544    MONOABS 1.0 11/20/2021 1544   EOSABS 0.4 11/20/2021 1544   BASOSABS 0.1 11/20/2021 1544        UA   evidence of UTI   Urine analysis:    Component Value Date/Time   COLORURINE YELLOW 11/20/2021 1608   APPEARANCEUR HAZY (A) 11/20/2021 1608   LABSPEC 1.014 11/20/2021 1608   PHURINE 5.0 11/20/2021 1608   GLUCOSEU 50 (A) 11/20/2021 1608   HGBUR SMALL (A) 11/20/2021 1608   BILIRUBINUR NEGATIVE 11/20/2021 1608   KETONESUR NEGATIVE 11/20/2021 1608   PROTEINUR 100 (A) 11/20/2021 1608   NITRITE NEGATIVE 11/20/2021 1608   LEUKOCYTESUR LARGE (A) 11/20/2021 1608    Results for orders placed or performed during the hospital encounter of 04/01/18  Urine culture     Status: None   Collection Time: 04/01/18  2:19 PM   Specimen: Urine, Clean Catch  Result Value Ref Range Status   Specimen Description   Final    URINE, CLEAN CATCH Performed at [facility], [address], [place], [place]    Special Requests   Final    NONE Performed at [facility], [address] [place], [place]    Culture   Final    NO GROWTH Performed at [facility], [address], [place], [place]    Report Status 04/02/2018 FINAL  Final  MRSA PCR Screening     Status: None   Collection Time: 04/01/18 11:49 PM   Specimen: Nasal Mucosa; Nasopharyngeal  Result Value Ref Range Status   MRSA by PCR NEGATIVE NEGATIVE Final    Comment:        The GeneXpert MRSA Assay (FDA approved for NASAL specimens only), is one component of a comprehensive MRSA colonization surveillance program. It is not intended to diagnose MRSA infection nor to guide or monitor treatment for MRSA infections. Performed at [facility], [address], [place], [place]      _______________________________________________ Hospitalist was called for admission for    UTI acute encephalopathy AKI    The following Work up has been ordered so  far:  Orders Placed This Encounter  Procedures   Urine Culture   CT Head Wo Contrast   CT Cervical Spine Wo Contrast  DG Chest Port 1 View   DG Pelvis Portable   CBC with Differential   Basic metabolic panel   Urinalysis, Routine w reflex microscopic   Blood gas, venous   CK   Hepatic function panel   Lactic acid, plasma   Magnesium   Phosphorus   TSH   Prealbumin   Consult to hospitalist   ED EKG   EKG 12-Lead   Saline lock IV     OTHER Significant initial  Findings:  labs showing:    Recent Labs  Lab 11/20/21 1544  NA 139  K 3.6  CO2 27  GLUCOSE 143*  BUN 20  CREATININE 1.06*  CALCIUM 10.2    Cr   Up from baseline see below Lab Results  Component Value Date   CREATININE 1.06 (H) 11/20/2021   CREATININE 0.85 04/03/2018   CREATININE 0.94 04/01/2018           Plt: Lab Results  Component Value Date   PLT 296 11/20/2021    Recent Labs  Lab 11/20/21 1544  WBC 10.7*  NEUTROABS 5.7  HGB 14.5  HCT 41.7  MCV 87.4  PLT 296    HG/HCT  stable,       Component Value Date/Time   HGB 14.5 11/20/2021 1544   HCT 41.7 11/20/2021 1544   MCV 87.4 11/20/2021 1544       DM  labs:  HbA1C: No results for input(s): "HGBA1C" in the last 8760 hours.     CBG (last 3)  No results for input(s): "GLUCAP" in the last 72 hours.        Cultures:    Component Value Date/Time   SDES  04/01/2018 1419    URINE, CLEAN CATCH Performed at [facility], [address], [place], [place] [address]    SPECREQUEST  04/01/2018 1419    NONE Performed at [facility], [address] [place], [place] [address]    CULT  04/01/2018 1419    NO GROWTH Performed at [facility], [address], [place], [place] [address]    REPTSTATUS 04/02/2018 FINAL 04/01/2018 1419     Radiological Exams on Admission: CT Head Wo Contrast  Result Date: 11/20/2021 CLINICAL DATA:  Unwitnessed fall this morning. EXAM: CT HEAD WITHOUT  CONTRAST CT CERVICAL SPINE WITHOUT CONTRAST TECHNIQUE: Multidetector CT imaging of the head and cervical spine was performed following the standard protocol without intravenous contrast. Multiplanar CT image reconstructions of the cervical spine were also generated. RADIATION DOSE REDUCTION: This exam was performed according to the departmental dose-optimization program which includes automated exposure control, adjustment of the mA and/or kV according to patient size and/or use of iterative reconstruction technique. COMPARISON:  09/15/2018 FINDINGS: CT HEAD FINDINGS Brain: No evidence of acute infarction, hemorrhage, extra-axial collection, ventriculomegaly, or mass effect. Old left parietal lobe infarct. Generalized cerebral atrophy. Periventricular white matter low attenuation likely secondary to microangiopathy. Vascular: Cerebrovascular atherosclerotic calcifications are noted. No hyperdense vessels. Skull: Negative for fracture or focal lesion. Sinuses/Orbits: Visualized portions of the orbits are unremarkable. Visualized portions of the paranasal sinuses are unremarkable. Visualized portions of the mastoid air cells are unremarkable. Other: None. CT CERVICAL SPINE FINDINGS Alignment: 2 mm anterolisthesis of C6 on C7. Skull base and vertebrae: No acute fracture. No primary bone lesion or focal pathologic process. Soft tissues and spinal canal: No prevertebral fluid or swelling. No visible canal hematoma. Disc levels: Mild degenerative disease with disc height loss at C3-4, C4-5 and C7-T1.  Bilateral facet arthropathy throughout the cervical spine. Broad-based disc osteophyte complex at C3-4 with bilateral uncovertebral degenerative changes. Right foraminal stenosis at C3-4. Mild left foraminal stenosis at C2-3.6 Upper chest: Lung apices are clear. Other: No fluid collection or hematoma. IMPRESSION: 1. No acute intracranial pathology. 2. Old left parietal lobe infarct. 3. No acute fracture or subluxation of the  cervical spine. Electronically Signed   By: Kathreen Devoid M.D.   On: 11/20/2021 18:01   CT Cervical Spine Wo Contrast  Result Date: 11/20/2021 CLINICAL DATA:  Unwitnessed fall this morning. EXAM: CT HEAD WITHOUT CONTRAST CT CERVICAL SPINE WITHOUT CONTRAST TECHNIQUE: Multidetector CT imaging of the head and cervical spine was performed following the standard protocol without intravenous contrast. Multiplanar CT image reconstructions of the cervical spine were also generated. RADIATION DOSE REDUCTION: This exam was performed according to the departmental dose-optimization program which includes automated exposure control, adjustment of the mA and/or kV according to patient size and/or use of iterative reconstruction technique. COMPARISON:  09/15/2018 FINDINGS: CT HEAD FINDINGS Brain: No evidence of acute infarction, hemorrhage, extra-axial collection, ventriculomegaly, or mass effect. Old left parietal lobe infarct. Generalized cerebral atrophy. Periventricular white matter low attenuation likely secondary to microangiopathy. Vascular: Cerebrovascular atherosclerotic calcifications are noted. No hyperdense vessels. Skull: Negative for fracture or focal lesion. Sinuses/Orbits: Visualized portions of the orbits are unremarkable. Visualized portions of the paranasal sinuses are unremarkable. Visualized portions of the mastoid air cells are unremarkable. Other: None. CT CERVICAL SPINE FINDINGS Alignment: 2 mm anterolisthesis of C6 on C7. Skull base and vertebrae: No acute fracture. No primary bone lesion or focal pathologic process. Soft tissues and spinal canal: No prevertebral fluid or swelling. No visible canal hematoma. Disc levels: Mild degenerative disease with disc height loss at C3-4, C4-5 and C7-T1. Bilateral facet arthropathy throughout the cervical spine. Broad-based disc osteophyte complex at C3-4 with bilateral uncovertebral degenerative changes. Right foraminal stenosis at C3-4. Mild left foraminal  stenosis at C2-3.6 Upper chest: Lung apices are clear. Other: No fluid collection or hematoma. IMPRESSION: 1. No acute intracranial pathology. 2. Old left parietal lobe infarct. 3. No acute fracture or subluxation of the cervical spine. Electronically Signed   By: Kathreen Devoid M.D.   On: 11/20/2021 18:01   DG Chest Port 1 View  Result Date: 11/20/2021 CLINICAL DATA:  Unwitnessed fall today. Possible head injury. Altered mental status. EXAM: PORTABLE CHEST 1 VIEW COMPARISON:  Radiographs 04/01/2018. FINDINGS: 1527 hours. There is patient rotation to the left. Allowing for this, the heart size and mediastinal contours are stable with aortic atherosclerosis. No evidence of mediastinal hematoma. Chronic elevation of the right hemidiaphragm with associated mild bibasilar atelectasis or scarring, similar to prior examination. No confluent airspace opacity, significant pleural effusion or pneumothorax. No acute fractures are identified. Telemetry leads overlie the chest. IMPRESSION: No evidence of acute posttraumatic findings or active cardiopulmonary process. Bibasilar atelectasis or scarring, similar to prior study. Electronically Signed   By: Richardean Sale M.D.   On: 11/20/2021 15:41   DG Pelvis Portable  Result Date: 11/20/2021 CLINICAL DATA:  Unwitnessed fall this morning.  Dementia. EXAM: PORTABLE PELVIS 1-2 VIEWS COMPARISON:  None Available. FINDINGS: No evidence of acute pelvic fracture or diastasis of the sacroiliac joints or symphysis pubis. There are mild degenerative changes at both hips and throughout the lumbar spine. The soft tissues appear unremarkable. Telemetry leads overlie the abdomen. IMPRESSION: No evidence of acute pelvic fracture or dislocation. Electronically Signed   By: Richardean Sale M.D.   On:  11/20/2021 15:39   _______________________________________________________________________________________________________ Latest  Blood pressure (!) 155/60, pulse 72, temperature 98 F (36.7  C), temperature source Oral, resp. rate 13, weight 66.3 kg, SpO2 98 %.   Vitals  labs and radiology finding personally reviewed  Review of Systems:    Pertinent positives include:    Fatigue confusion Constitutional:  No weight loss, night sweats, Fevers, chills,, weight loss  HEENT:  No headaches, Difficulty swallowing,Tooth/dental problems,Sore throat,  No sneezing, itching, ear ache, nasal congestion, post nasal drip,  Cardio-vascular:  No chest pain, Orthopnea, PND, anasarca, dizziness, palpitations.no Bilateral lower extremity swelling  GI:  No heartburn, indigestion, abdominal pain, nausea, vomiting, diarrhea, change in bowel habits, loss of appetite, melena, blood in stool, hematemesis Resp:  no shortness of breath at rest. No dyspnea on exertion, No excess mucus, no productive cough, No non-productive cough, No coughing up of blood.No change in color of mucus.No wheezing. Skin:  no rash or lesions. No jaundice GU:  no dysuria, change in color of urine, no urgency or frequency. No straining to urinate.  No flank pain.  Musculoskeletal:  No joint pain or no joint swelling. No decreased range of motion. No back pain.  Psych:  No change in mood or affect. No depression or anxiety. No memory loss.  Neuro: no localizing neurological complaints, no tingling, no weakness, no double vision, no gait abnormality, no slurred speech, no   All systems reviewed and apart from [place] all are negative _______________________________________________________________________________________________ Past Medical History:   Past Medical History:  Diagnosis Date   Dementia ([place])    Diabetes mellitus without complication ([place])    Hypertension       History reviewed. No pertinent surgical history.  Social History:       reports that she has never smoked. She has never used smokeless tobacco. She reports that she does not drink alcohol and does not use drugs.     Family History:    Family History  Problem Relation Age of Onset   Hypertension Mother    Stroke Mother    Hypertension Father    Stroke Father    ______________________________________________________________________________________________ Allergies: No Known Allergies   Prior to Admission medications   Medication Sig Start Date End Date Taking? Authorizing Provider  amLODipine (NORVASC) 5 MG tablet Take 5 mg by mouth daily. 09/24/21  Yes [provider]  acetaminophen (TYLENOL) 500 MG tablet Take 500 mg by mouth every 4 (four) hours as needed for mild pain.    [provider]  ammonium lactate (LAC-HYDRIN) 12 % lotion Apply at bedtime to legs, feet but not to toes. 12/29/17   Gardiner Barefoot, DPM  aspirin 325 MG tablet Take 1 tablet (325 mg total) by mouth daily. 04/04/18   Patrecia Pour, MD  Cholecalciferol (VITAMIN D3) 50 MCG (2000 UT) TABS Take by mouth daily.    [provider]  cloNIDine (CATAPRES) 0.1 MG tablet  08/24/18   [provider]  fenofibrate (TRICOR) 48 MG tablet  12/25/18   [provider]  fluocinonide (LIDEX) 0.05 % external solution Apply 1 application topically 2 (two) times daily as needed (scalp).    [provider]  JANUVIA 100 MG tablet Take 100 mg by mouth daily.  03/03/18   [provider]  ketoconazole (NIZORAL) 2 % cream Apply 1 application topically 2 (two) times a week. Two times weekly    [provider]  Melatonin 5 MG CAPS Take by mouth daily.    [provider]  metFORMIN (GLUCOPHAGE) 500 MG tablet  03/16/19   [provider]  Multiple Vitamins-Minerals (CEROVITE PO) Take by mouth daily.    [provider]  Polyethyl Glycol-Propyl Glycol (SYSTANE ULTRA) 0.4-0.3 % SOLN Apply 1 drop to eye 4 (four) times daily as needed (dry eyes/eye irritation).    [provider]  Skin Protectants, Misc. (MINERIN CREME EX) Apply topically daily.    [provider]     ___________________________________________________________________________________________________ Physical Exam:    11/20/2021    6:45 PM 11/20/2021    5:30 PM 11/20/2021    5:15 PM  Vitals with BMI  Systolic [id number]  Diastolic 60 55 48  Pulse 72 72 62     1. General:  in No  Acute distress   Chronically ill  -appearing 2. Psychological: Oriented to self only somnolent 3. Head/ENT:    Dry Mucous Membranes                          Head Non traumatic, neck supple                           Poor Dentition 4. SKIN:  decreased Skin turgor,  Skin clean Dry and intact no rash 5. Heart: Regular rate and rhythm no  Murmur, no Rub or gallop 6. Lungs: no wheezes or crackles   7. Abdomen: Soft,  non-tender, Non distended   obese  bowel sounds present 8. Lower extremities: no clubbing, cyanosis, no  edema 9. Neurologically Grossly intact, moving all 4 extremities equally  10. MSK: Normal range of motion    Chart has been reviewed  ______________________________________________________________________________________________  Assessment/Plan 88 y.o. female with medical history significant of dementia Frequent UTIs, history of stroke, hypertension hyperlipidemia diabetes  Admitted for acute encephalopathy, UTI and AKI  Present on Admission:  Acute metabolic encephalopathy  Essential hypertension  Hyperlipidemia  Dementia without behavioral disturbance (HCC)  UTI (urinary tract infection)  Fall     Acute metabolic encephalopathy   - most likely multifactorial secondary to combination of  Infection  mild dehydration secondary to decreased by mouth intake,    - Will rehydrate   - treat underlining infection   - Hold contributing medications   - if no improvement may need further imaging to evaluate for CNS pathology pathology such as MRI of the brain   - neurological exam appears to be nonfocal but patient unable to cooperate fully   - VBG ORDERED    - no history of  liver disease    History of CVA (cerebrovascular accident) Continue aspirin 81 mg p.o. daily  Essential hypertension Continue Norvasc 5 mg p.o. daily  Hyperlipidemia Continue Tricor 48 mg p.o. daily  Controlled type 2 diabetes mellitus without complication, without long-term current use of insulin (HCC) Order sliding scale hold p.o. medications Continue Lantus at 5 units nightly  Dementia without behavioral disturbance ([place]) Monitor for any sign of sundowning especially in the setting of infection  UTI (urinary tract infection)  - treat with Rocephin         await results of urine culture and adjust antibiotic coverage as needed   Fall Unclear etiology  canno provie hx cannot rule out syncope will order echo   Other plan as per orders.  DVT prophylaxis:  SCD     Code Status:  DNR/DNI   as per paper work   I had personally discussed CODE  STATUS with patient and family* I had spent *min discussing goals of care and CODE STATUS    Family Communication:   Family not at  Bedside     Disposition Plan:                              Back to current facility when stable                            Following barriers for discharge:                        Mental status improved                        Would benefit from PT/OT eval prior to DC  Ordered                   Swallow eval - SLP ordered                   Diabetes care coordinator                   Transition of care consulted                   Nutrition    consulted                                     Palliative care    consulted                                     Consults called:none  Admission status:  ED Disposition     ED Disposition  [place]: [facility] [[id number]]  Level of Care: Telemetry Medical [[id number]]  May place patient in observation at [facility] or [place] if equivalent level of care is available:: No  Covid Evaluation:  Asymptomatic - no recent exposure (last 10 days) testing not required  Diagnosis: Acute metabolic encephalopathy [id number]  Admitting Physician: Toy Baker [[id number]]  Attending Physician: Toy Baker [[id number]]           Level of care     tele  For 12H     n very high risk for false native test result    Regginald Pask 11/20/2021, 8:09 PM    [facility]     after 2 AM please page floor coverage PA If 7AM-7PM, please contact the day team taking care of the patient using [email or web address]   Patient was evaluated in the context of the global COVID-19 pandemic, which necessitated consideration that the patient might be at risk for infection with the SARS-CoV-2 virus that causes COVID-19. Institutional protocols and algorithms that pertain to the evaluation of patients at risk for COVID-19 are in a state of rapid change based on information released by regulatory bodies including the CDC and federal and state organizations. These policies and algorithms were followed during the patient's care.

## 2021-11-20 NOTE — Assessment & Plan Note (Signed)
-   most likely multifactorial secondary to combination of  Infection  mild dehydration secondary to decreased by mouth intake,    - Will rehydrate   - treat underlining infection   - Hold contributing medications   - if no improvement may need further imaging to evaluate for CNS pathology pathology such as MRI of the brain   - neurological exam appears to be nonfocal but patient unable to cooperate fully   - VBG ORDERED    - no history of liver disease

## 2021-11-20 NOTE — Assessment & Plan Note (Signed)
Monitor for any sign of sundowning especially in the setting of infection

## 2021-11-20 NOTE — Assessment & Plan Note (Addendum)
Order sliding scale hold p.o. medications Continue Lantus at 5 units nightly

## 2021-11-20 NOTE — ED Triage Notes (Signed)
Pt arrived from [place] by EMS. Pt has dementia at baseline but had unwitnesseed fall this AM, facility said pt has not been acting the same since. Potentially hit head, not on blood thinners

## 2021-11-20 NOTE — Assessment & Plan Note (Signed)
-   treat with Rocephin         await results of urine culture and adjust antibiotic coverage as needed  

## 2021-11-20 NOTE — Subjective & Objective (Signed)
Brought in by EMS from [facility] Noted to have a fall which was unwitnessed.  Noted to be somewhat more confused after the fall although as per discussion with family patient has been even confused yesterday. Also reporting some dysuria At this point patient unable to provide detailed history.

## 2021-11-20 NOTE — Assessment & Plan Note (Signed)
Unclear etiology  canno provie hx cannot rule out syncope will order echo

## 2021-11-20 NOTE — Assessment & Plan Note (Addendum)
Continue aspirin 81 mg p.o. daily

## 2021-11-21 ENCOUNTER — Observation Stay (HOSPITAL_COMMUNITY): Payer: Medicare Other

## 2021-11-21 DIAGNOSIS — D72828 Other elevated white blood cell count: Secondary | ICD-10-CM | POA: Diagnosis not present

## 2021-11-21 DIAGNOSIS — Z7982 Long term (current) use of aspirin: Secondary | ICD-10-CM | POA: Diagnosis not present

## 2021-11-21 DIAGNOSIS — Z79899 Other long term (current) drug therapy: Secondary | ICD-10-CM | POA: Diagnosis not present

## 2021-11-21 DIAGNOSIS — R300 Dysuria: Secondary | ICD-10-CM | POA: Diagnosis not present

## 2021-11-21 DIAGNOSIS — I130 Hypertensive heart and chronic kidney disease with heart failure and stage 1 through stage 4 chronic kidney disease, or unspecified chronic kidney disease: Secondary | ICD-10-CM | POA: Diagnosis not present

## 2021-11-21 DIAGNOSIS — G9341 Metabolic encephalopathy: Secondary | ICD-10-CM | POA: Diagnosis not present

## 2021-11-21 DIAGNOSIS — Z7984 Long term (current) use of oral hypoglycemic drugs: Secondary | ICD-10-CM | POA: Diagnosis not present

## 2021-11-21 DIAGNOSIS — Z66 Do not resuscitate: Secondary | ICD-10-CM | POA: Diagnosis not present

## 2021-11-21 DIAGNOSIS — R4182 Altered mental status, unspecified: Secondary | ICD-10-CM | POA: Diagnosis present

## 2021-11-21 DIAGNOSIS — Z8673 Personal history of transient ischemic attack (TIA), and cerebral infarction without residual deficits: Secondary | ICD-10-CM | POA: Diagnosis not present

## 2021-11-21 DIAGNOSIS — Z87440 Personal history of urinary (tract) infections: Secondary | ICD-10-CM | POA: Diagnosis not present

## 2021-11-21 DIAGNOSIS — N1832 Chronic kidney disease, stage 3b: Secondary | ICD-10-CM | POA: Diagnosis not present

## 2021-11-21 DIAGNOSIS — Z823 Family history of stroke: Secondary | ICD-10-CM | POA: Diagnosis not present

## 2021-11-21 DIAGNOSIS — I5032 Chronic diastolic (congestive) heart failure: Secondary | ICD-10-CM | POA: Diagnosis not present

## 2021-11-21 DIAGNOSIS — E1122 Type 2 diabetes mellitus with diabetic chronic kidney disease: Secondary | ICD-10-CM | POA: Diagnosis not present

## 2021-11-21 DIAGNOSIS — N179 Acute kidney failure, unspecified: Secondary | ICD-10-CM | POA: Diagnosis not present

## 2021-11-21 DIAGNOSIS — E785 Hyperlipidemia, unspecified: Secondary | ICD-10-CM | POA: Diagnosis not present

## 2021-11-21 DIAGNOSIS — R55 Syncope and collapse: Secondary | ICD-10-CM | POA: Diagnosis not present

## 2021-11-21 DIAGNOSIS — E860 Dehydration: Secondary | ICD-10-CM | POA: Diagnosis not present

## 2021-11-21 DIAGNOSIS — Z9181 History of falling: Secondary | ICD-10-CM | POA: Diagnosis not present

## 2021-11-21 DIAGNOSIS — Z1152 Encounter for screening for COVID-19: Secondary | ICD-10-CM | POA: Diagnosis not present

## 2021-11-21 DIAGNOSIS — Z8249 Family history of ischemic heart disease and other diseases of the circulatory system: Secondary | ICD-10-CM | POA: Diagnosis not present

## 2021-11-21 DIAGNOSIS — F0390 Unspecified dementia without behavioral disturbance: Secondary | ICD-10-CM | POA: Diagnosis not present

## 2021-11-21 LAB — CBC
HCT: 37.3 % (ref 36.0–46.0)
Hemoglobin: 13 g/dL (ref 12.0–15.0)
MCH: 30.7 pg (ref 26.0–34.0)
MCHC: 34.9 g/dL (ref 30.0–36.0)
MCV: 88.2 fL (ref 80.0–100.0)
Platelets: 259 10*3/uL (ref 150–400)
RBC: 4.23 MIL/uL (ref 3.87–5.11)
RDW: 12.6 % (ref 11.5–15.5)
WBC: 10.7 10*3/uL — ABNORMAL HIGH (ref 4.0–10.5)
nRBC: 0 % (ref 0.0–0.2)

## 2021-11-21 LAB — ECHOCARDIOGRAM COMPLETE
AR max vel: 2.51 cm2
AV Peak grad: 10.9 mmHg
Ao pk vel: 1.65 m/s
Area-P 1/2: 3.27 cm2
P 1/2 time: 391 msec
S' Lateral: 2 cm
Weight: 2338.64 oz

## 2021-11-21 LAB — COMPREHENSIVE METABOLIC PANEL
ALT: 19 U/L (ref 0–44)
AST: 27 U/L (ref 15–41)
Albumin: 2.9 g/dL — ABNORMAL LOW (ref 3.5–5.0)
Alkaline Phosphatase: 31 U/L — ABNORMAL LOW (ref 38–126)
Anion gap: 8 (ref 5–15)
BUN: 19 mg/dL (ref 8–23)
CO2: 25 mmol/L (ref 22–32)
Calcium: 9.3 mg/dL (ref 8.9–10.3)
Chloride: 107 mmol/L (ref 98–111)
Creatinine, Ser: 0.96 mg/dL (ref 0.44–1.00)
GFR, Estimated: 55 mL/min — ABNORMAL LOW (ref >60)
Glucose, Bld: 138 mg/dL — ABNORMAL HIGH (ref 70–99)
Potassium: 3.3 mmol/L — ABNORMAL LOW (ref 3.5–5.1)
Sodium: 140 mmol/L (ref 135–145)
Total Bilirubin: 0.6 mg/dL (ref 0.3–1.2)
Total Protein: 5.8 g/dL — ABNORMAL LOW (ref 6.5–8.1)

## 2021-11-21 LAB — URINE CULTURE: Culture: NO GROWTH

## 2021-11-21 LAB — CBG MONITORING, ED
Glucose-Capillary: 141 mg/dL — ABNORMAL HIGH (ref 70–99)
Glucose-Capillary: 143 mg/dL — ABNORMAL HIGH (ref 70–99)
Glucose-Capillary: 144 mg/dL — ABNORMAL HIGH (ref 70–99)
Glucose-Capillary: 193 mg/dL — ABNORMAL HIGH (ref 70–99)

## 2021-11-21 LAB — GLUCOSE, CAPILLARY
Glucose-Capillary: 144 mg/dL — ABNORMAL HIGH (ref 70–99)
Glucose-Capillary: 176 mg/dL — ABNORMAL HIGH (ref 70–99)

## 2021-11-21 LAB — AMMONIA: Ammonia: 34 umol/L (ref 9–35)

## 2021-11-21 LAB — TSH: TSH: 0.972 u[IU]/mL (ref 0.350–4.500)

## 2021-11-21 LAB — PREALBUMIN: Prealbumin: 19 mg/dL (ref 18–38)

## 2021-11-21 LAB — VITAMIN B12: Vitamin B-12: 681 pg/mL (ref 180–914)

## 2021-11-21 MED ORDER — IPRATROPIUM-ALBUTEROL 0.5-2.5 (3) MG/3ML IN SOLN: 3.0000 mL | RESPIRATORY_TRACT | Status: DC | PRN

## 2021-11-21 MED ORDER — POTASSIUM CHLORIDE 10 MEQ/100ML IV SOLN
10.0000 meq | INTRAVENOUS | Status: DC
  Administered 2021-11-21 (×2): 10 meq via INTRAVENOUS
  Filled 2021-11-21 (×2): qty 100

## 2021-11-21 MED ORDER — POTASSIUM CHLORIDE 10 MEQ/100ML IV SOLN
10.0000 meq | INTRAVENOUS | Status: AC
  Administered 2021-11-21 (×2): 10 meq via INTRAVENOUS
  Filled 2021-11-21 (×2): qty 100

## 2021-11-21 MED ORDER — TRAZODONE HCL 50 MG PO TABS
50.0000 mg | ORAL_TABLET | Freq: Every evening | ORAL | Status: DC | PRN
  Administered 2021-11-22: 50 mg via ORAL
  Filled 2021-11-21: qty 1

## 2021-11-21 MED ORDER — HYDRALAZINE HCL 20 MG/ML IJ SOLN: 10.0000 mg | INTRAMUSCULAR | Status: DC | PRN

## 2021-11-21 MED ORDER — GUAIFENESIN 100 MG/5ML PO LIQD: 5.0000 mL | ORAL | Status: DC | PRN

## 2021-11-21 MED ORDER — METOPROLOL TARTRATE 5 MG/5ML IV SOLN: 5.0000 mg | INTRAVENOUS | Status: DC | PRN

## 2021-11-21 MED ORDER — OXYCODONE HCL 5 MG PO TABS: 5.0000 mg | ORAL_TABLET | ORAL | Status: DC | PRN

## 2021-11-21 NOTE — Evaluation (Signed)
Occupational Therapy Evaluation Patient Details Name: Natalie Peterson MRN: [id number] DOB: 06/30/1926 Today's Date: 11/21/2021   History of Present Illness Pt is a 88 y/o female admitted secondary to increased confusion and fall. Thought to be secondary to acute metabolic encephalopathy. PMH includes dementia, CVA, HTN, and DM.   Clinical Impression   Pt admitted for concerns listed above. PTA pt was living at [facility], unsure level of assist needed at baseline. At this time, pt is requiring mod-max A for most ADL's and mod A +2 for functional mobility. She requires increased verbal cuing for safety and sequencing. Recommending when pt is medically stable to return to [facility], where she can have HHOT as needed. OT will follow acutely.       Recommendations for follow up therapy are one component of a multi-disciplinary discharge planning process, led by the attending physician.  Recommendations may be updated based on patient status, additional functional criteria and insurance authorization.   Follow Up Recommendations  Other (comment) (Return to [facility], with OT follow up as needed.)    Assistance Recommended at Discharge Frequent or constant Supervision/Assistance  Patient can return home with the following A lot of help with walking and/or transfers;A lot of help with bathing/dressing/bathroom;Assistance with cooking/housework    Functional Status Assessment  Patient has had a recent decline in their functional status and demonstrates the ability to make significant improvements in function in a reasonable and predictable amount of time.  Equipment Recommendations  None recommended by OT    Recommendations for Other Services       Precautions / Restrictions Precautions Precautions: Fall Restrictions Weight Bearing Restrictions: No      Mobility Bed Mobility Overal bed mobility: Needs Assistance Bed Mobility: Supine to Sit, Sit to Supine     Supine to sit: Min  assist Sit to supine: Min guard   General bed mobility comments: Min A to pull to sitting, min guard for safety to return to supine    Transfers Overall transfer level: Needs assistance Equipment used: 2 person hand held assist Transfers: Sit to/from Stand Sit to Stand: Mod assist, +2 physical assistance, +2 safety/equipment           General transfer comment: Mod to power up and steady      Balance Overall balance assessment: Needs assistance Sitting-balance support: No upper extremity supported, Feet unsupported Sitting balance-Leahy Scale: Fair     Standing balance support: Bilateral upper extremity supported, Reliant on assistive device for balance Standing balance-Leahy Scale: Poor                             ADL either performed or assessed with clinical judgement   ADL Overall ADL's : Needs assistance/impaired Eating/Feeding: Minimal assistance;Cueing for sequencing;Sitting   Grooming: Minimal assistance;Cueing for sequencing;Sitting   Upper Body Bathing: Minimal assistance;Cueing for sequencing;Sitting   Lower Body Bathing: Moderate assistance;Sitting/lateral leans;Sit to/from stand;Cueing for sequencing   Upper Body Dressing : Minimal assistance;Sitting;Cueing for sequencing   Lower Body Dressing: Moderate assistance;Cueing for sequencing;Sitting/lateral leans;Sit to/from stand   Toilet Transfer: Moderate assistance;Stand-pivot;Cueing for safety   Toileting- Clothing Manipulation and Hygiene: Moderate assistance;Cueing for safety;Sit to/from stand;Sitting/lateral lean         General ADL Comments: Pt needs increased cuing for safety     Vision Baseline Vision/History: 0 No visual deficits Ability to See in Adequate Light: 0 Adequate Patient Visual Report: No change from baseline Vision Assessment?: No apparent visual deficits  Perception     Praxis      Pertinent Vitals/Pain Pain Assessment Pain Assessment: Faces Faces Pain  Scale: Hurts a little bit Pain Location: LUE Pain Descriptors / Indicators: Aching, Discomfort Pain Intervention(s): Monitored during session     Hand Dominance Right   Extremity/Trunk Assessment Upper Extremity Assessment Upper Extremity Assessment: Generalized weakness   Lower Extremity Assessment Lower Extremity Assessment: Defer to PT evaluation   Cervical / Trunk Assessment Cervical / Trunk Assessment: Kyphotic   Communication Communication Communication: HOH   Cognition Arousal/Alertness: Awake/alert Behavior During Therapy: WFL for tasks assessed/performed Overall Cognitive Status: History of cognitive impairments - at baseline                                       General Comments  VSS on RA    Exercises     Shoulder Instructions      Home Living Family/patient expects to be discharged to:: Other (Comment) [place])                                        Prior Functioning/Environment Prior Level of Function : Needs assist             Mobility Comments: Reports she does not use RW, but unsure of accuracy. Anticipate pt requires assist with mobility ADLs Comments: Anticipate pt requires assist with ADLs.        OT Problem List: Decreased strength;Decreased activity tolerance;Impaired balance (sitting and/or standing);Decreased safety awareness;Decreased cognition      OT Treatment/Interventions: Self-care/ADL training;Therapeutic exercise;Energy conservation;DME and/or AE instruction;Cognitive remediation/compensation;Therapeutic activities;Patient/family education;Balance training    OT Goals(Current goals can be found in the care plan section) Acute Rehab OT Goals Patient Stated Goal: None stated OT Goal Formulation: Patient unable to participate in goal setting Time For Goal Achievement: 12/05/21 Potential to Achieve Goals: Fair ADL Goals Pt Will Perform Grooming: with modified independence;sitting Pt Will  Perform Lower Body Bathing: with min assist;sitting/lateral leans;sit to/from stand Pt Will Perform Lower Body Dressing: with min assist;sit to/from stand;sitting/lateral leans Pt Will Transfer to Toilet: with min assist;ambulating Pt Will Perform Toileting - Clothing Manipulation and hygiene: with min assist;sitting/lateral leans;sit to/from stand  OT Frequency: Min 2X/week    Co-evaluation PT/OT/SLP Co-Evaluation/Treatment: Yes Reason for Co-Treatment: For patient/therapist safety;To address functional/ADL transfers PT goals addressed during session: Balance;Mobility/safety with mobility OT goals addressed during session: Strengthening/ROM      AM-PAC OT "6 Clicks" Daily Activity     Outcome Measure Help from another person eating meals?: A Little Help from another person taking care of personal grooming?: A Little Help from another person toileting, which includes using toliet, bedpan, or urinal?: A Lot Help from another person bathing (including washing, rinsing, drying)?: A Lot Help from another person to put on and taking off regular upper body clothing?: A Lot Help from another person to put on and taking off regular lower body clothing?: A Lot 6 Click Score: 14   End of Session Equipment Utilized During Treatment: Gait belt Nurse Communication: Mobility status  Activity Tolerance: Patient tolerated treatment well Patient left: in bed;with call bell/phone within reach  OT Visit Diagnosis: Unsteadiness on feet (R26.81);Other abnormalities of gait and mobility (R26.89);Muscle weakness (generalized) (M62.81)                Time:  6967-8938 OT Time Calculation (min): 11 min Charges:  OT General Charges $OT Visit: 1 Visit OT Evaluation $OT Eval Moderate Complexity: 1 Mod  Demico Ploch H., OTR/L Acute Rehabilitation  Juel Ripley Elane Bing Plume 11/21/2021, 1:56 PM

## 2021-11-21 NOTE — Evaluation (Signed)
Clinical/Bedside Swallow Evaluation Patient Details  Name: Natalie Peterson MRN: [id number] Date of Birth: Jan 15, 1927  Today's Date: 11/21/2021 Time: SLP Start Time (ACUTE ONLY): 1000 SLP Stop Time (ACUTE ONLY): 1027 SLP Time Calculation (min) (ACUTE ONLY): 27 min  Past Medical History:  Past Medical History:  Diagnosis Date   Dementia (HCC)    Diabetes mellitus without complication (HCC)    Hypertension    Past Surgical History: History reviewed. No pertinent surgical history. HPI:  Natalie Peterson is a 88 y.o. female with medical history significant of dementia  Frequent UTIs, history of stroke, hypertension hyperlipidemia diabetes; admitted with unwitnessed fall/confusion on 11/20/21; CXR on 11/20/21 indicated no acute abnormality; BSE ordered.    Assessment / Plan / Recommendation  Clinical Impression  Pt seen for clinical swallowing evaluation d/t hx of CVA, dementia and unwitnessed fall to assess swallow function.  Pt consumed thin via cup/straw self fed, as well as solids (cracker pack) with slight decreased mastication d/t missing dentition, but slow, thorough mastication efforts noted throughout evaluation.  Timely swallow and adequate oral prep/propulsion and oropharyngeal clearance observed.  No overt s/s of aspiration witnessed during this evaluation.  Recommend continuing current diet of regular/thin liquids (carb modified) with general swallowing precautions in place and A with set up of tray during meals and intermittent supervision.  ST will s/o in this setting as tx is not indicated at this time.  Thank you for this consult. SLP Visit Diagnosis: Dysphagia, unspecified (R13.10)    Aspiration Risk  Mild aspiration risk    Diet Recommendation   Regular/thin liquids  Medication Administration: Whole meds with puree (d/t cognitive impairment prn)    Other  Recommendations Oral Care Recommendations: Oral care BID;Staff/trained caregiver to provide oral care     Recommendations for follow up therapy are one component of a multi-disciplinary discharge planning process, led by the attending physician.  Recommendations may be updated based on patient status, additional functional criteria and insurance authorization.  Follow up Recommendations No SLP follow up      Assistance Recommended at Discharge Set up Supervision/Assistance  Functional Status Assessment Patient has had a recent decline in their functional status and demonstrates the ability to make significant improvements in function in a reasonable and predictable amount of time.  Frequency and Duration  (Evaluation only)          Prognosis Prognosis for Safe Diet Advancement: Good      Swallow Study   General Date of Onset: 11/20/21 HPI: Natalie Peterson is a 88 y.o. female with medical history significant of dementia  Frequent UTIs, history of stroke, hypertension hyperlipidemia diabetes; admitted with unwitnessed fall/confusion on 11/20/21; CXR on 11/20/21 indicated no acute abnormality; BSE ordered. Type of Study: Bedside Swallow Evaluation Previous Swallow Assessment: n/a Diet Prior to this Study: Regular;Thin liquids Temperature Spikes Noted: No Respiratory Status: Room air History of Recent Intubation: No Behavior/Cognition: Alert;Distractible;Requires cueing;Other (Comment) (distracted d/t pain in hand at IV site) Oral Cavity Assessment: Within Functional Limits Oral Care Completed by SLP: Recent completion by staff Oral Cavity - Dentition: Adequate natural dentition;Missing dentition Vision: Functional for self-feeding Self-Feeding Abilities: Able to feed self;Needs assist Patient Positioning: Upright in bed Baseline Vocal Quality: Normal Volitional Cough: Cognitively unable to elicit Volitional Swallow: Able to elicit    Oral/Motor/Sensory Function Overall Oral Motor/Sensory Function: Within functional limits   Ice Chips Ice chips: Not tested Other Comments: Pt  refused   Thin Liquid Thin Liquid: Within functional limits Presentation: Cup;Self Fed;Straw  Nectar Thick Nectar Thick Liquid: Not tested   Honey Thick Honey Thick Liquid: Not tested   Puree Puree: Not tested Other Comments: Pt refused   Solid     Solid: Within functional limits Presentation: Self Fed      Tressie Stalker, M.S., CCC-SLP 11/21/2021,10:56 AM

## 2021-11-21 NOTE — Progress Notes (Signed)
TOC CSW received a return call from [place] at [place].  Pt is in LTC Skilled portion of [place].  Janessa Mickle Tarpley-Carter, MSW, LCSW-A Pronouns:  She/Her/Hers [facility] Health[facility]itions of Care Clinical Social Worker Direct Number:  [phone] [email or web address]

## 2021-11-21 NOTE — Progress Notes (Addendum)
TOC CSW contacted pts daughter Rylyn Zawistowski [phone] to inquire about pt living at [facility].  Pt was living in the Assisted Living portion of [place].  Everest Brod Tarpley-Carter, MSW, LCSW-A Pronouns:  She/Her/Hers [facility] Clinical Social Worker Direct Number:  [phone] [email or web address]

## 2021-11-21 NOTE — Evaluation (Signed)
Physical Therapy Evaluation Patient Details Name: Natalie Peterson MRN: [id number] DOB: [birth year] Today's Date: 11/21/2021  History of Present Illness  Pt is a 88 y/o female admitted secondary to increased confusion and fall. Thought to be secondary to acute metabolic encephalopathy. PMH includes dementia, CVA, HTN, and DM.  Clinical Impression  Pt admitted secondary to problem above with deficits below. Pt pleasantly confused throughout. Required min to min guard for bed mobility and mod A +2 to stand and take steps at EOB. Unsure of pt's baseline. If not close to baseline, pt would benefit from PT follow up at [facility]. Will continue to follow acutely.        Recommendations for follow up therapy are one component of a multi-disciplinary discharge planning process, led by the attending physician.  Recommendations may be updated based on patient status, additional functional criteria and insurance authorization.  Follow Up Recommendations Other (comment) (Return to [facility] with PT follow up as needed)      Assistance Recommended at Discharge Frequent or constant Supervision/Assistance  Patient can return home with the following  A lot of help with walking and/or transfers;A lot of help with bathing/dressing/bathroom;Assistance with cooking/housework;Direct supervision/assist for financial management;Direct supervision/assist for medications management;Assist for transportation;Help with stairs or ramp for entrance    Equipment Recommendations Wheelchair cushion (measurements PT);Wheelchair (measurements PT)  Recommendations for Other Services       Functional Status Assessment Patient has had a recent decline in their functional status and demonstrates the ability to make significant improvements in function in a reasonable and predictable amount of time.     Precautions / Restrictions Precautions Precautions: Fall Restrictions Weight Bearing Restrictions: No       Mobility  Bed Mobility Overal bed mobility: Needs Assistance Bed Mobility: Supine to Sit, Sit to Supine     Supine to sit: Min assist Sit to supine: Min guard   General bed mobility comments: Min A to pull to sitting, min guard for safety to return to supine    Transfers Overall transfer level: Needs assistance Equipment used: 2 person hand held assist Transfers: Sit to/from Stand Sit to Stand: Mod assist, +2 physical assistance, +2 safety/equipment           General transfer comment: Mod to power up and steady. Required mod A +2 to take side steps at EOB for repositioning.    Ambulation/Gait                  Stairs            Wheelchair Mobility    Modified Rankin (Stroke Patients Only)       Balance Overall balance assessment: Needs assistance Sitting-balance support: No upper extremity supported, Feet unsupported Sitting balance-Leahy Scale: Fair     Standing balance support: Bilateral upper extremity supported, Reliant on assistive device for balance Standing balance-Leahy Scale: Poor                               Pertinent Vitals/Pain Pain Assessment Pain Assessment: Faces Faces Pain Scale: Hurts a little bit Pain Location: LUE Pain Descriptors / Indicators: Aching, Discomfort Pain Intervention(s): Limited activity within patient's tolerance, Monitored during session, Repositioned    Home Living Family/patient expects to be discharged to:: Other (Comment) [place])                        Prior Function Prior Level of Function :  Needs assist             Mobility Comments: Reports she does not use RW, but unsure of accuracy. Anticipate pt requires assist with mobility ADLs Comments: Anticipate pt requires assist with ADLs.     Hand Dominance   Dominant Hand: Right    Extremity/Trunk Assessment   Upper Extremity Assessment Upper Extremity Assessment: Defer to OT evaluation    Lower Extremity  Assessment Lower Extremity Assessment: Generalized weakness    Cervical / Trunk Assessment Cervical / Trunk Assessment: Kyphotic  Communication   Communication: HOH  Cognition Arousal/Alertness: Awake/alert Behavior During Therapy: WFL for tasks assessed/performed Overall Cognitive Status: History of cognitive impairments - at baseline                                 General Comments: Dementia at baseline        General Comments General comments (skin integrity, edema, etc.): VSS    Exercises     Assessment/Plan    PT Assessment Patient needs continued PT services  PT Problem List Decreased strength;Decreased range of motion;Decreased activity tolerance;Decreased balance;Decreased mobility;Decreased knowledge of use of DME;Decreased knowledge of precautions;Decreased safety awareness;Decreased cognition       PT Treatment Interventions DME instruction;Gait training;Therapeutic activities;Functional mobility training;Therapeutic exercise;Balance training;Patient/family education;Cognitive remediation    PT Goals (Current goals can be found in the Care Plan section)  Acute Rehab PT Goals PT Goal Formulation: Patient unable to participate in goal setting Time For Goal Achievement: 12/05/21 Potential to Achieve Goals: Fair    Frequency Min 2X/week     Co-evaluation PT/OT/SLP Co-Evaluation/Treatment: Yes Reason for Co-Treatment: For patient/therapist safety;To address functional/ADL transfers PT goals addressed during session: Mobility/safety with mobility;Balance OT goals addressed during session: Strengthening/ROM       AM-PAC PT "6 Clicks" Mobility  Outcome Measure Help needed turning from your back to your side while in a flat bed without using bedrails?: A Little Help needed moving from lying on your back to sitting on the side of a flat bed without using bedrails?: A Little Help needed moving to and from a bed to a chair (including a wheelchair)?:  A Lot Help needed standing up from a chair using your arms (e.g., wheelchair or bedside chair)?: A Lot Help needed to walk in hospital room?: Total Help needed climbing 3-5 steps with a railing? : Total 6 Click Score: 12    End of Session Equipment Utilized During Treatment: Gait belt Activity Tolerance: Patient limited by fatigue Patient left: in bed;with call bell/phone within reach (on stretcher in ED) Nurse Communication: Mobility status PT Visit Diagnosis: Unsteadiness on feet (R26.81);Muscle weakness (generalized) (M62.81);Difficulty in walking, not elsewhere classified (R26.2)    Time: 4656-8127 PT Time Calculation (min) (ACUTE ONLY): 13 min   Charges:   PT Evaluation $PT Eval Moderate Complexity: 1 Mod          Farley Ly, PT, DPT  Acute Rehabilitation Services  Office: [phone]   Lehman Prom 11/21/2021, 2:18 PM

## 2021-11-21 NOTE — Progress Notes (Addendum)
PROGRESS NOTE    Natalie Peterson  BDZ:[id number] DOB: [birth year] DOA: 11/20/2021 PCP: Merlene Laughter, MD   Brief Narrative:  88 year old with history of dementia, frequent UTIs, CVA, HTN, DM 2, HLD admitted after an unwitnessed fall from [place].  Patient was also confused.  Daughter reported this frequently happens to her whenever she catches urinary tract infection.  CT of the head was negative, UA showed concerns of UTI therefore started on empiric IV Rocephin.   Assessment & Plan:  Principal Problem:   Acute metabolic encephalopathy Active Problems:   History of CVA (cerebrovascular accident)   Essential hypertension   Hyperlipidemia   Controlled type 2 diabetes mellitus without complication, without long-term current use of insulin (HCC)   Dementia without behavioral disturbance (HCC)   UTI (urinary tract infection)   Fall    Acute metabolic encephalopathy Acute urinary tract infection -Still confused. Per daughter patient at baseline AAOx 1-2. Today she just knows her name; she is more confused than her baseline. CT of the head upon admission was negative.  No obvious evidence of trauma noted.  Chest x-ray is negative.  CT head shows old parietal infarct.  Empiric IV Rocephin started along with gentle hydration.  Supportive care, neurochecks. - Ammonia/TSH normal. B12 is pending  Essential hypertension -On Norvasc.  IV as needed ordered  Hyperlipidemia -On fenofibrate daily  Insulin-dependent diabetes mellitus type 2 -On Lantus 5 units at bedtime along with sliding scale.  Home p.o. meds on hold  History of CVA Dementia - Patient is on daily aspirin.  Will need PT/OT She uses walker to ambulate at baseline.    DVT prophylaxis: SCDs Start: 11/20/21 1930 Code Status:  Family Communication:  Bonita Quin Updated.   Still confused at weak, hopefully will improve over next 24 hrs.   Subjective: Feels ok, alert to her name only. No other complaints. Poor historian.    Daughter tells me she speak with her weakly. Carries on basic conversation but poor historian and frequent confusion.   Examination: Constitutional: Not in acute distress. Elderly frail  Respiratory: Clear to auscultation bilaterally Cardiovascular: Normal sinus rhythm, no rubs Abdomen: Nontender nondistended good bowel sounds Musculoskeletal: No edema noted Skin: No rashes seen Neurologic: CN 2-12 grossly intact.  And nonfocal Psychiatric: Alert to her name online.   Objective: Vitals:   11/21/21 0600 11/21/21 0700 11/21/21 1000 11/21/21 1150  BP: (!) 120/98 (!) 142/45 123/67   Pulse: 74 64 71   Resp: 13 15 (!) 23   Temp:    98 F (36.7 C)  TempSrc:    Oral  SpO2: 97% 95% 96%   Weight:        Intake/Output Summary (Last 24 hours) at 11/21/2021 1304 Last data filed at 11/21/2021 0705 Gross per 24 hour  Intake 666.7 ml  Output --  Net 666.7 ml   Filed Weights   11/20/21 1455  Weight: 66.3 kg     Data Reviewed:   CBC: Recent Labs  Lab 11/20/21 1544 11/20/21 2207 11/21/21 0507  WBC 10.7*  --  10.7*  NEUTROABS 5.7  --   --   HGB 14.5 12.9 13.0  HCT 41.7 38.0 37.3  MCV 87.4  --  88.2  PLT 296  --  259   Basic Metabolic Panel: Recent Labs  Lab 11/20/21 1544 11/20/21 2151 11/20/21 2207 11/21/21 0507  NA 139  --  138 140  K 3.6  --  3.3* 3.3*  CL 102  --   --  107  CO2 27  --   --  25  GLUCOSE 143*  --   --  138*  BUN 20  --   --  19  CREATININE 1.06*  --   --  0.96  CALCIUM 10.2  --   --  9.3  MG  --  1.9  --   --   PHOS  --  2.9  --   --    GFR: CrCl cannot be calculated (Unknown ideal weight.). Liver Function Tests: Recent Labs  Lab 11/20/21 2151 11/21/21 0507  AST 27 27  ALT 23 19  ALKPHOS 34* 31*  BILITOT 0.5 0.6  PROT 6.6 5.8*  ALBUMIN 3.1* 2.9*   No results for input(s): "LIPASE", "AMYLASE" in the last 168 hours. Recent Labs  Lab 11/21/21 1150  AMMONIA 34   Coagulation Profile: No results for input(s): "INR", "PROTIME" in  the last 168 hours. Cardiac Enzymes: Recent Labs  Lab 11/20/21 2151  CKTOTAL 72   BNP (last 3 results) No results for input(s): "PROBNP" in the last 8760 hours. HbA1C: No results for input(s): "HGBA1C" in the last 72 hours. CBG: Recent Labs  Lab 11/20/21 2147 11/21/21 0044 11/21/21 0408 11/21/21 0754 11/21/21 1149  GLUCAP 141* 144* 143* 141* 193*   Lipid Profile: No results for input(s): "CHOL", "HDL", "LDLCALC", "TRIG", "CHOLHDL", "LDLDIRECT" in the last 72 hours. Thyroid Function Tests: No results for input(s): "TSH", "T4TOTAL", "FREET4", "T3FREE", "THYROIDAB" in the last 72 hours. Anemia Panel: No results for input(s): "VITAMINB12", "FOLATE", "FERRITIN", "TIBC", "IRON", "RETICCTPCT" in the last 72 hours. Sepsis Labs: Recent Labs  Lab 11/20/21 2151  LATICACIDVEN 1.4    Recent Results (from the past 240 hour(s))  SARS Coronavirus 2 by RT PCR (hospital order, performed in [facility] hospital lab) *cepheid single result test* Anterior Nasal Swab     Status: None   Collection Time: 11/20/21  9:35 PM   Specimen: Anterior Nasal Swab  Result Value Ref Range Status   SARS Coronavirus 2 by RT PCR NEGATIVE NEGATIVE Final    Comment: (NOTE) SARS-CoV-2 target nucleic acids are NOT DETECTED.  The SARS-CoV-2 RNA is generally detectable in upper and lower respiratory specimens during the acute phase of infection. The lowest concentration of SARS-CoV-2 viral copies this assay can detect is 250 copies / mL. A negative result does not preclude SARS-CoV-2 infection and should not be used as the sole basis for treatment or other patient management decisions.  A negative result may occur with improper specimen collection / handling, submission of specimen other than nasopharyngeal swab, presence of viral mutation(s) within the areas targeted by this assay, and inadequate number of viral copies (<250 copies / mL). A negative result must be combined with clinical observations,  patient history, and epidemiological information.  Fact Sheet for Patients:   RoadLapTop.co.za  Fact Sheet for Healthcare Providers: http://kim-miller.com/  This test is not yet approved or  cleared by the [place] FDA and has been authorized for detection and/or diagnosis of SARS-CoV-2 by FDA under an Emergency Use Authorization (EUA).  This EUA will remain in effect (meaning this test can be used) for the duration of the COVID-19 declaration under Section 564(b)(1) of the Act, 21 U.S.C. section 360bbb-3(b)(1), unless the authorization is terminated or revoked sooner.  Performed at [facility], [address] [place], [place] [address]          Radiology Studies: CT Head Wo Contrast  Result Date: 11/20/2021 CLINICAL DATA:  Unwitnessed  fall this morning. EXAM: CT HEAD WITHOUT CONTRAST CT CERVICAL SPINE WITHOUT CONTRAST TECHNIQUE: Multidetector CT imaging of the head and cervical spine was performed following the standard protocol without intravenous contrast. Multiplanar CT image reconstructions of the cervical spine were also generated. RADIATION DOSE REDUCTION: This exam was performed according to the departmental dose-optimization program which includes automated exposure control, adjustment of the mA and/or kV according to patient size and/or use of iterative reconstruction technique. COMPARISON:  09/15/2018 FINDINGS: CT HEAD FINDINGS Brain: No evidence of acute infarction, hemorrhage, extra-axial collection, ventriculomegaly, or mass effect. Old left parietal lobe infarct. Generalized cerebral atrophy. Periventricular white matter low attenuation likely secondary to microangiopathy. Vascular: Cerebrovascular atherosclerotic calcifications are noted. No hyperdense vessels. Skull: Negative for fracture or focal lesion. Sinuses/Orbits: Visualized portions of the orbits are unremarkable. Visualized portions of the paranasal sinuses  are unremarkable. Visualized portions of the mastoid air cells are unremarkable. Other: None. CT CERVICAL SPINE FINDINGS Alignment: 2 mm anterolisthesis of C6 on C7. Skull base and vertebrae: No acute fracture. No primary bone lesion or focal pathologic process. Soft tissues and spinal canal: No prevertebral fluid or swelling. No visible canal hematoma. Disc levels: Mild degenerative disease with disc height loss at C3-4, C4-5 and C7-T1. Bilateral facet arthropathy throughout the cervical spine. Broad-based disc osteophyte complex at C3-4 with bilateral uncovertebral degenerative changes. Right foraminal stenosis at C3-4. Mild left foraminal stenosis at C2-3.6 Upper chest: Lung apices are clear. Other: No fluid collection or hematoma. IMPRESSION: 1. No acute intracranial pathology. 2. Old left parietal lobe infarct. 3. No acute fracture or subluxation of the cervical spine. Electronically Signed   By: Elige Ko M.D.   On: 11/20/2021 18:01   CT Cervical Spine Wo Contrast  Result Date: 11/20/2021 CLINICAL DATA:  Unwitnessed fall this morning. EXAM: CT HEAD WITHOUT CONTRAST CT CERVICAL SPINE WITHOUT CONTRAST TECHNIQUE: Multidetector CT imaging of the head and cervical spine was performed following the standard protocol without intravenous contrast. Multiplanar CT image reconstructions of the cervical spine were also generated. RADIATION DOSE REDUCTION: This exam was performed according to the departmental dose-optimization program which includes automated exposure control, adjustment of the mA and/or kV according to patient size and/or use of iterative reconstruction technique. COMPARISON:  09/15/2018 FINDINGS: CT HEAD FINDINGS Brain: No evidence of acute infarction, hemorrhage, extra-axial collection, ventriculomegaly, or mass effect. Old left parietal lobe infarct. Generalized cerebral atrophy. Periventricular white matter low attenuation likely secondary to microangiopathy. Vascular: Cerebrovascular  atherosclerotic calcifications are noted. No hyperdense vessels. Skull: Negative for fracture or focal lesion. Sinuses/Orbits: Visualized portions of the orbits are unremarkable. Visualized portions of the paranasal sinuses are unremarkable. Visualized portions of the mastoid air cells are unremarkable. Other: None. CT CERVICAL SPINE FINDINGS Alignment: 2 mm anterolisthesis of C6 on C7. Skull base and vertebrae: No acute fracture. No primary bone lesion or focal pathologic process. Soft tissues and spinal canal: No prevertebral fluid or swelling. No visible canal hematoma. Disc levels: Mild degenerative disease with disc height loss at C3-4, C4-5 and C7-T1. Bilateral facet arthropathy throughout the cervical spine. Broad-based disc osteophyte complex at C3-4 with bilateral uncovertebral degenerative changes. Right foraminal stenosis at C3-4. Mild left foraminal stenosis at C2-3.6 Upper chest: Lung apices are clear. Other: No fluid collection or hematoma. IMPRESSION: 1. No acute intracranial pathology. 2. Old left parietal lobe infarct. 3. No acute fracture or subluxation of the cervical spine. Electronically Signed   By: Elige Ko M.D.   On: 11/20/2021 18:01   DG Chest [facility]  1 View  Result Date: 11/20/2021 CLINICAL DATA:  Unwitnessed fall today. Possible head injury. Altered mental status. EXAM: PORTABLE CHEST 1 VIEW COMPARISON:  Radiographs 04/01/2018. FINDINGS: 1527 hours. There is patient rotation to the left. Allowing for this, the heart size and mediastinal contours are stable with aortic atherosclerosis. No evidence of mediastinal hematoma. Chronic elevation of the right hemidiaphragm with associated mild bibasilar atelectasis or scarring, similar to prior examination. No confluent airspace opacity, significant pleural effusion or pneumothorax. No acute fractures are identified. Telemetry leads overlie the chest. IMPRESSION: No evidence of acute posttraumatic findings or active cardiopulmonary process.  Bibasilar atelectasis or scarring, similar to prior study. Electronically Signed   By: Carey Bullocks M.D.   On: 11/20/2021 15:41   DG Pelvis Portable  Result Date: 11/20/2021 CLINICAL DATA:  Unwitnessed fall this morning.  Dementia. EXAM: PORTABLE PELVIS 1-2 VIEWS COMPARISON:  None Available. FINDINGS: No evidence of acute pelvic fracture or diastasis of the sacroiliac joints or symphysis pubis. There are mild degenerative changes at both hips and throughout the lumbar spine. The soft tissues appear unremarkable. Telemetry leads overlie the abdomen. IMPRESSION: No evidence of acute pelvic fracture or dislocation. Electronically Signed   By: Carey Bullocks M.D.   On: 11/20/2021 15:39        Scheduled Meds:  amLODipine  5 mg Oral Daily   aspirin EC  81 mg Oral Daily   fenofibrate  160 mg Oral Daily   insulin aspart  0-9 Units Subcutaneous Q4H   insulin glargine-yfgn  5 Units Subcutaneous QHS   Continuous Infusions:  cefTRIAXone (ROCEPHIN)  IV     potassium chloride 10 mEq (11/21/21 1006)     LOS: 0 days   Time spent= 35 mins    Jla Reynolds Joline Maxcy, MD [facility]  If 7PM-7AM, please contact night-coverage  11/21/2021, 1:04 PM

## 2021-11-22 DIAGNOSIS — R4182 Altered mental status, unspecified: Secondary | ICD-10-CM | POA: Diagnosis not present

## 2021-11-22 DIAGNOSIS — G9341 Metabolic encephalopathy: Secondary | ICD-10-CM

## 2021-11-22 DIAGNOSIS — Z515 Encounter for palliative care: Secondary | ICD-10-CM | POA: Diagnosis not present

## 2021-11-22 LAB — CBC
HCT: 40.6 % (ref 36.0–46.0)
Hemoglobin: 14.4 g/dL (ref 12.0–15.0)
MCH: 30.6 pg (ref 26.0–34.0)
MCHC: 35.5 g/dL (ref 30.0–36.0)
MCV: 86.2 fL (ref 80.0–100.0)
Platelets: 288 10*3/uL (ref 150–400)
RBC: 4.71 MIL/uL (ref 3.87–5.11)
RDW: 12.2 % (ref 11.5–15.5)
WBC: 15.2 10*3/uL — ABNORMAL HIGH (ref 4.0–10.5)
nRBC: 0 % (ref 0.0–0.2)

## 2021-11-22 LAB — BASIC METABOLIC PANEL
Anion gap: 7 (ref 5–15)
BUN: 13 mg/dL (ref 8–23)
CO2: 22 mmol/L (ref 22–32)
Calcium: 8.7 mg/dL — ABNORMAL LOW (ref 8.9–10.3)
Chloride: 105 mmol/L (ref 98–111)
Creatinine, Ser: 0.93 mg/dL (ref 0.44–1.00)
GFR, Estimated: 57 mL/min — ABNORMAL LOW (ref >60)
Glucose, Bld: 256 mg/dL — ABNORMAL HIGH (ref 70–99)
Potassium: 3.7 mmol/L (ref 3.5–5.1)
Sodium: 134 mmol/L — ABNORMAL LOW (ref 135–145)

## 2021-11-22 LAB — OSMOLALITY: Osmolality: 298 mOsm/kg — ABNORMAL HIGH (ref 275–295)

## 2021-11-22 LAB — FOLATE: Folate: 30.9 ng/mL (ref >5.9)

## 2021-11-22 LAB — GLUCOSE, CAPILLARY
Glucose-Capillary: 186 mg/dL — ABNORMAL HIGH (ref 70–99)
Glucose-Capillary: 204 mg/dL — ABNORMAL HIGH (ref 70–99)
Glucose-Capillary: 258 mg/dL — ABNORMAL HIGH (ref 70–99)
Glucose-Capillary: 263 mg/dL — ABNORMAL HIGH (ref 70–99)

## 2021-11-22 LAB — MAGNESIUM: Magnesium: 1.9 mg/dL (ref 1.7–2.4)

## 2021-11-22 MED ORDER — INSULIN ASPART 100 UNIT/ML IJ SOLN
0.0000 [IU] | Freq: Three times a day (TID) | INTRAMUSCULAR | Status: DC
  Administered 2021-11-22: 2 [IU] via SUBCUTANEOUS
  Administered 2021-11-22: 3 [IU] via SUBCUTANEOUS

## 2021-11-22 MED ORDER — ADULT MULTIVITAMIN W/MINERALS CH
1.0000 | ORAL_TABLET | Freq: Every day | ORAL | Status: DC
  Administered 2021-11-22: 1 via ORAL
  Filled 2021-11-22: qty 1

## 2021-11-22 MED ORDER — GLUCERNA SHAKE PO LIQD: 237.0000 mL | Freq: Three times a day (TID) | ORAL | Status: DC

## 2021-11-22 NOTE — TOC Progression Note (Signed)
Transition of Care [facility]) - Progression Note    Patient Details  Name: Natalie Peterson MRN: [id number] Date of Birth: 1926/11/24  Transition of Care [facility]) CM/SW Contact  Donnalee Curry, LCSWA Phone Number: 11/22/2021, 12:37 PM  Clinical Narrative:     SW informed pt medically ready for d/c.  SW spoke with Tresa Endo [facility] [phone]) confirmed able to return today. Requested d/c summary faxed to: [phone]  Call Report: [phone]       Expected Discharge Plan and Services                                                 Social Determinants of Health (SDOH) Interventions    Readmission Risk Interventions     No data to display

## 2021-11-22 NOTE — Discharge Summary (Signed)
Physician Discharge Summary  Natalie Peterson [id number] DOB: Oct 27, 1926 DOA: 11/20/2021  PCP: Lajean Manes, MD  Admit date: 11/20/2021 Discharge date: 11/22/2021  Admitted From: [facility] Disposition: [place]  Recommendations for Outpatient Follow-up:  Follow up with PCP in 1-2 weeks Please obtain BMP/CBC in one week Drink plenty of water.  Continue current medications. Recommend Glucerna with meals Recommend outpatient PT/OT  Home Health: PT/OT Equipment/Devices: None Discharge Condition: Stable CODE STATUS: DNR Diet recommendation: Low-sodium/low-carb diet  Brief/Interim Summary: 88 year old with history of dementia, frequent UTIs, CVA, HTN, DM 2, HLD admitted after an unwitnessed fall from [place].  Patient was also confused.  Daughter reported this frequently happens to her whenever she catches urinary tract infection.  CT of the head was negative, UA showed concerns of UTI therefore started on empiric IV Rocephin and admitted for further evaluation and management.  Acute metabolic encephalopathy -CT head/cervical spine: Negative for any acute findings.  Pelvic x-ray negative for fracture.  Chest x-ray negative for pneumonia.  Initial UA was concerning for UTI therefore patient was started on Rocephin.  Urine culture resulted no growth therefore Rocephin was discontinued on 7/22. -Work-up including TSH, ammonia, B12, folate all resulted within normal limits. -Phosphorous, magnesium: WNL -PT/OT recommended outpatient OT/PT follow-up as needed -Palliative care consulted and recommended outpatient follow-up  Essential hypertension -Blood pressure has been labile.  Continued amlodipine  Hyperlipidemia -Continued fenofibrate  Insulin-dependent diabetes mellitus type 2 -A1c: Pending.  On metformin and Januvia at home.  Patient started on on Lantus 5 units at bedtime along with sliding scale during hospitalization. -Resumed home medications at the time of  discharge.  History of CVA -Continued aspirin  Leukocytosis: Chronic -She is afebrile.  Chest x-ray: Negative for pneumonia.  Urine culture: No growth -Discontinued Rocephin -Repeat CBC with PCP outpatient  CKD stage IIIb: -Creatinine improved from 1.06-0.93. -Repeat BMP at follow-up visit  History of dementia: Mentation improved. -At baseline she is alert and oriented x1-2  Discharge Diagnoses:  Acute metabolic encephalopathy UTI ruled out Essential hypertension Hyperlipidemia Insulin-dependent type 2 diabetes melitis History of CVA Dementia Leukocytosis Chronic kidney disease stage IIIb   Discharge Instructions  Discharge Instructions     Increase activity slowly   Complete by: As directed       Allergies as of 11/22/2021   No Known Allergies      Medication List     STOP taking these medications    ammonium lactate 12 % lotion Commonly known as: Lac-Hydrin   cloNIDine 0.1 MG tablet Commonly known as: CATAPRES   ketoconazole 2 % cream Commonly known as: NIZORAL   MINERIN CREME EX       TAKE these medications    acetaminophen 500 MG tablet Commonly known as: TYLENOL Take 500 mg by mouth every 4 (four) hours as needed for mild pain.   amLODipine 5 MG tablet Commonly known as: NORVASC Take 5 mg by mouth daily.   aspirin EC 81 MG tablet Take 81 mg by mouth daily. Swallow whole. What changed: Another medication with the same name was removed. Continue taking this medication, and follow the directions you see here.   CEROVITE PO Take 1 tablet by mouth daily.   cholecalciferol 25 MCG (1000 UNIT) tablet Commonly known as: VITAMIN D Take 1,000 Units by mouth daily.   fenofibrate 48 MG tablet Commonly known as: TRICOR Take 48 mg by mouth daily.   fluocinonide 0.05 % external solution Commonly known as: LIDEX Apply 1 application topically 2 (two) times daily  as needed (scalp).   hydrochlorothiazide 12.5 MG capsule Commonly known as:  MICROZIDE Take 12.5 mg by mouth daily.   Lantus SoloStar 100 UNIT/ML Solostar Pen Generic drug: insulin glargine Inject 5 Units into the skin at bedtime.   Melatonin 5 MG Caps Take by mouth daily.   metFORMIN 500 MG tablet Commonly known as: GLUCOPHAGE Take 500 mg by mouth daily.   NUTRITIONAL DRINK PO Take 120 mLs by mouth in the morning, at noon, and at bedtime. Med Pass 2.0   polyethylene glycol 17 g packet Commonly known as: MIRALAX / GLYCOLAX Take 17 g by mouth daily.   PRIMIDONE PO Take 12.5 mg by mouth at bedtime.   sennosides-docusate sodium 8.6-50 MG tablet Commonly known as: SENOKOT-S Take 1 tablet by mouth at bedtime.   sitaGLIPtin 50 MG tablet Commonly known as: JANUVIA Take 50 mg by mouth daily.   Systane Ultra 0.4-0.3 % Soln Generic drug: Polyethyl Glycol-Propyl Glycol Apply 1 drop to eye 4 (four) times daily as needed (dry eyes/eye irritation).        No Known Allergies  Consultations: None   Procedures/Studies: ECHOCARDIOGRAM COMPLETE  Result Date: 11/21/2021    ECHOCARDIOGRAM REPORT   Patient Name:   Natalie Peterson Date of Exam: 11/21/2021 Medical Rec #:  [id number]           Height: Accession #:    [id number]          Weight:       146.2 lb Date of Birth:  [birth year]           BSA:          1.638 m Patient Age:    88 years            BP:           145/53 mmHg Patient Gender: F                   HR:           78 bpm. Exam Location:  Inpatient Procedure: 2D Echo, Cardiac Doppler and Color Doppler Indications:    Syncope  History:        Patient has prior history of Echocardiogram examinations, most                 recent 04/03/2018. Risk Factors:Diabetes and Hypertension.  Sonographer:    Eduard Roux Referring Phys: [id number] ANASTASSIA DOUTOVA IMPRESSIONS  1. Left ventricular ejection fraction, by estimation, is 60 to 65%. The left ventricle has normal function. The left ventricle has no regional wall motion abnormalities. There is mild left  ventricular hypertrophy. Left ventricular diastolic parameters are consistent with Grade I diastolic dysfunction (impaired relaxation).  2. Right ventricular systolic function is normal. The right ventricular size is normal.  3. Left atrial size was mildly dilated.  4. The mitral valve is abnormal. Mild mitral valve regurgitation. No evidence of mitral stenosis. Moderate mitral annular calcification.  5. The aortic valve is tricuspid. There is moderate calcification of the aortic valve. There is moderate thickening of the aortic valve. Aortic valve regurgitation is mild. Aortic valve sclerosis/calcification is present, without any evidence of aortic stenosis.  6. The inferior vena cava is normal in size with greater than 50% respiratory variability, suggesting right atrial pressure of 3 mmHg. FINDINGS  Left Ventricle: Left ventricular ejection fraction, by estimation, is 60 to 65%. The left ventricle has normal function. The left ventricle has no regional wall motion abnormalities. The left  ventricular internal cavity size was normal in size. There is  mild left ventricular hypertrophy. Left ventricular diastolic parameters are consistent with Grade I diastolic dysfunction (impaired relaxation). Right Ventricle: The right ventricular size is normal. No increase in right ventricular wall thickness. Right ventricular systolic function is normal. Left Atrium: Left atrial size was mildly dilated. Right Atrium: Right atrial size was normal in size. Pericardium: There is no evidence of pericardial effusion. Mitral Valve: The mitral valve is abnormal. There is moderate thickening of the mitral valve leaflet(s). There is moderate calcification of the mitral valve leaflet(s). Moderate mitral annular calcification. Mild mitral valve regurgitation. No evidence of mitral valve stenosis. Tricuspid Valve: The tricuspid valve is normal in structure. Tricuspid valve regurgitation is not demonstrated. No evidence of tricuspid  stenosis. Aortic Valve: The aortic valve is tricuspid. There is moderate calcification of the aortic valve. There is moderate thickening of the aortic valve. Aortic valve regurgitation is mild. Aortic regurgitation PHT measures 391 msec. Aortic valve sclerosis/calcification is present, without any evidence of aortic stenosis. Aortic valve peak gradient measures 10.9 mmHg. Pulmonic Valve: The pulmonic valve was normal in structure. Pulmonic valve regurgitation is not visualized. No evidence of pulmonic stenosis. Aorta: The aortic root is normal in size and structure. Venous: The inferior vena cava is normal in size with greater than 50% respiratory variability, suggesting right atrial pressure of 3 mmHg. IAS/Shunts: No atrial level shunt detected by color flow Doppler.  LEFT VENTRICLE PLAX 2D LVIDd:         4.10 cm   Diastology LVIDs:         2.00 cm   LV e' medial:    4.43 cm/s LV PW:         1.20 cm   LV E/e' medial:  12.9 LV IVS:        1.20 cm   LV e' lateral:   4.88 cm/s LVOT diam:     1.80 cm   LV E/e' lateral: 11.7 LV SV:         88 LV SV Index:   54 LVOT Area:     2.54 cm  RIGHT VENTRICLE RV Basal diam:  2.70 cm RV S prime:     11.10 cm/s TAPSE (M-mode): 2.0 cm LEFT ATRIUM             Index        RIGHT ATRIUM          Index LA diam:        3.80 cm 2.32 cm/m   RA Area:     8.01 cm LA Vol (A2C):   50.0 ml 30.52 ml/m  RA Volume:   13.20 ml 8.06 ml/m LA Vol (A4C):   49.2 ml 30.03 ml/m LA Biplane Vol: 50.4 ml 30.76 ml/m  AORTIC VALVE                  PULMONIC VALVE AV Area (Vmax): 2.51 cm      PV Vmax:       1.28 m/s AV Vmax:        165.00 cm/s   PV Peak grad:  6.6 mmHg AV Peak Grad:   10.9 mmHg LVOT Vmax:      163.00 cm/s LVOT Vmean:     105.000 cm/s LVOT VTI:       0.347 m AI PHT:         391 msec  AORTA Ao Root diam: 3.30 cm Ao Asc diam:  3.20 cm MITRAL  VALVE                TRICUSPID VALVE MV Area (PHT): 3.27 cm     TR Peak grad:   30.5 mmHg MV Decel Time: 232 msec     TR Vmax:        276.00 cm/s MV E  velocity: 57.30 cm/s MV A velocity: 121.00 cm/s  SHUNTS MV E/A ratio:  0.47         Systemic VTI:  0.35 m                             Systemic Diam: 1.80 cm Jenkins Rouge MD Electronically signed by Jenkins Rouge MD Signature Date/Time: 11/21/2021/3:42:48 PM    Final    CT Head Wo Contrast  Result Date: 11/20/2021 CLINICAL DATA:  Unwitnessed fall this morning. EXAM: CT HEAD WITHOUT CONTRAST CT CERVICAL SPINE WITHOUT CONTRAST TECHNIQUE: Multidetector CT imaging of the head and cervical spine was performed following the standard protocol without intravenous contrast. Multiplanar CT image reconstructions of the cervical spine were also generated. RADIATION DOSE REDUCTION: This exam was performed according to the departmental dose-optimization program which includes automated exposure control, adjustment of the mA and/or kV according to patient size and/or use of iterative reconstruction technique. COMPARISON:  09/15/2018 FINDINGS: CT HEAD FINDINGS Brain: No evidence of acute infarction, hemorrhage, extra-axial collection, ventriculomegaly, or mass effect. Old left parietal lobe infarct. Generalized cerebral atrophy. Periventricular white matter low attenuation likely secondary to microangiopathy. Vascular: Cerebrovascular atherosclerotic calcifications are noted. No hyperdense vessels. Skull: Negative for fracture or focal lesion. Sinuses/Orbits: Visualized portions of the orbits are unremarkable. Visualized portions of the paranasal sinuses are unremarkable. Visualized portions of the mastoid air cells are unremarkable. Other: None. CT CERVICAL SPINE FINDINGS Alignment: 2 mm anterolisthesis of C6 on C7. Skull base and vertebrae: No acute fracture. No primary bone lesion or focal pathologic process. Soft tissues and spinal canal: No prevertebral fluid or swelling. No visible canal hematoma. Disc levels: Mild degenerative disease with disc height loss at C3-4, C4-5 and C7-T1. Bilateral facet arthropathy throughout the  cervical spine. Broad-based disc osteophyte complex at C3-4 with bilateral uncovertebral degenerative changes. Right foraminal stenosis at C3-4. Mild left foraminal stenosis at C2-3.6 Upper chest: Lung apices are clear. Other: No fluid collection or hematoma. IMPRESSION: 1. No acute intracranial pathology. 2. Old left parietal lobe infarct. 3. No acute fracture or subluxation of the cervical spine. Electronically Signed   By: Kathreen Devoid M.D.   On: 11/20/2021 18:01   CT Cervical Spine Wo Contrast  Result Date: 11/20/2021 CLINICAL DATA:  Unwitnessed fall this morning. EXAM: CT HEAD WITHOUT CONTRAST CT CERVICAL SPINE WITHOUT CONTRAST TECHNIQUE: Multidetector CT imaging of the head and cervical spine was performed following the standard protocol without intravenous contrast. Multiplanar CT image reconstructions of the cervical spine were also generated. RADIATION DOSE REDUCTION: This exam was performed according to the departmental dose-optimization program which includes automated exposure control, adjustment of the mA and/or kV according to patient size and/or use of iterative reconstruction technique. COMPARISON:  09/15/2018 FINDINGS: CT HEAD FINDINGS Brain: No evidence of acute infarction, hemorrhage, extra-axial collection, ventriculomegaly, or mass effect. Old left parietal lobe infarct. Generalized cerebral atrophy. Periventricular white matter low attenuation likely secondary to microangiopathy. Vascular: Cerebrovascular atherosclerotic calcifications are noted. No hyperdense vessels. Skull: Negative for fracture or focal lesion. Sinuses/Orbits: Visualized portions of the orbits are unremarkable. Visualized portions of the paranasal sinuses are unremarkable.  Visualized portions of the mastoid air cells are unremarkable. Other: None. CT CERVICAL SPINE FINDINGS Alignment: 2 mm anterolisthesis of C6 on C7. Skull base and vertebrae: No acute fracture. No primary bone lesion or focal pathologic process. Soft  tissues and spinal canal: No prevertebral fluid or swelling. No visible canal hematoma. Disc levels: Mild degenerative disease with disc height loss at C3-4, C4-5 and C7-T1. Bilateral facet arthropathy throughout the cervical spine. Broad-based disc osteophyte complex at C3-4 with bilateral uncovertebral degenerative changes. Right foraminal stenosis at C3-4. Mild left foraminal stenosis at C2-3.6 Upper chest: Lung apices are clear. Other: No fluid collection or hematoma. IMPRESSION: 1. No acute intracranial pathology. 2. Old left parietal lobe infarct. 3. No acute fracture or subluxation of the cervical spine. Electronically Signed   By: Kathreen Devoid M.D.   On: 11/20/2021 18:01   DG Chest Port 1 View  Result Date: 11/20/2021 CLINICAL DATA:  Unwitnessed fall today. Possible head injury. Altered mental status. EXAM: PORTABLE CHEST 1 VIEW COMPARISON:  Radiographs 04/01/2018. FINDINGS: 1527 hours. There is patient rotation to the left. Allowing for this, the heart size and mediastinal contours are stable with aortic atherosclerosis. No evidence of mediastinal hematoma. Chronic elevation of the right hemidiaphragm with associated mild bibasilar atelectasis or scarring, similar to prior examination. No confluent airspace opacity, significant pleural effusion or pneumothorax. No acute fractures are identified. Telemetry leads overlie the chest. IMPRESSION: No evidence of acute posttraumatic findings or active cardiopulmonary process. Bibasilar atelectasis or scarring, similar to prior study. Electronically Signed   By: Richardean Sale M.D.   On: 11/20/2021 15:41   DG Pelvis Portable  Result Date: 11/20/2021 CLINICAL DATA:  Unwitnessed fall this morning.  Dementia. EXAM: PORTABLE PELVIS 1-2 VIEWS COMPARISON:  None Available. FINDINGS: No evidence of acute pelvic fracture or diastasis of the sacroiliac joints or symphysis pubis. There are mild degenerative changes at both hips and throughout the lumbar spine. The  soft tissues appear unremarkable. Telemetry leads overlie the abdomen. IMPRESSION: No evidence of acute pelvic fracture or dislocation. Electronically Signed   By: Richardean Sale M.D.   On: 11/20/2021 15:39      Subjective: Patient seen and examined.  Resting comfortably on the bed.  She denies any new complaints.  No acute events overnight.  Remained afebrile.  I talked to patient's daughter and discussed plan of care.  Discussed negative work-up including CT head/CT cervical spine.  Urine culture showed no growth.  Patient's daughter verbalized understanding and agreeable for the discharge back to [facility].  Discharge Exam: Vitals:   11/22/21 0809 11/22/21 1138  BP: (!) 142/68 97/85  Pulse: 86 75  Resp: 18 16  Temp: 98.7 F (37.1 C) 98.4 F (36.9 C)  SpO2: 94% 96%   Vitals:   11/22/21 0015 11/22/21 0415 11/22/21 0809 11/22/21 1138  BP: (!) 162/64 (!) 163/69 (!) 142/68 97/85  Pulse: 98 (!) 106 86 75  Resp: 19 20 18 16   Temp: 98 F (36.7 C) 98.2 F (36.8 C) 98.7 F (37.1 C) 98.4 F (36.9 C)  TempSrc: Temporal Oral Axillary Oral  SpO2: 97% 97% 94% 96%  Weight:      Height:        General: not in acute distress, on room air, hard of hearing, lying comfortably on the bed. Cardiovascular: RRR, S1/S2 +, no rubs, no gallops Respiratory: CTA bilaterally, no wheezing, no rhonchi Abdominal: Soft, NT, ND, bowel sounds + Extremities: no edema, no cyanosis    The results of significant  diagnostics from this hospitalization (including imaging, microbiology, ancillary and laboratory) are listed below for reference.     Microbiology: Recent Results (from the past 240 hour(s))  Urine Culture     Status: None   Collection Time: 11/20/21  4:08 PM   Specimen: Urine, Catheterized  Result Value Ref Range Status   Specimen Description URINE, CATHETERIZED  Final   Special Requests NONE  Final   Culture   Final    NO GROWTH Performed at [facility], [address],  [place], [place] [address]    Report Status 11/21/2021 FINAL  Final  SARS Coronavirus 2 by RT PCR (hospital order, performed in [facility] hospital lab) *cepheid single result test* Anterior Nasal Swab     Status: None   Collection Time: 11/20/21  9:35 PM   Specimen: Anterior Nasal Swab  Result Value Ref Range Status   SARS Coronavirus 2 by RT PCR NEGATIVE NEGATIVE Final    Comment: (NOTE) SARS-CoV-2 target nucleic acids are NOT DETECTED.  The SARS-CoV-2 RNA is generally detectable in upper and lower respiratory specimens during the acute phase of infection. The lowest concentration of SARS-CoV-2 viral copies this assay can detect is 250 copies / mL. A negative result does not preclude SARS-CoV-2 infection and should not be used as the sole basis for treatment or other patient management decisions.  A negative result may occur with improper specimen collection / handling, submission of specimen other than nasopharyngeal swab, presence of viral mutation(s) within the areas targeted by this assay, and inadequate number of viral copies (<250 copies / mL). A negative result must be combined with clinical observations, patient history, and epidemiological information.  Fact Sheet for Patients:   https://www.patel.info/  Fact Sheet for Healthcare Providers: https://hall.com/  This test is not yet approved or  cleared by the [place] FDA and has been authorized for detection and/or diagnosis of SARS-CoV-2 by FDA under an Emergency Use Authorization (EUA).  This EUA will remain in effect (meaning this test can be used) for the duration of the COVID-19 declaration under Section 564(b)(1) of the Act, 21 U.S.C. section 360bbb-3(b)(1), unless the authorization is terminated or revoked sooner.  Performed at [facility], [place] [address] [place], [place] [address]      Labs: BNP (last 3 results) No results for input(s): "BNP" in the last  8760 hours. Basic Metabolic Panel: Recent Labs  Lab 11/20/21 1544 11/20/21 2151 11/20/21 2207 11/21/21 0507 11/22/21 0454  NA 139  --  138 140 134*  K 3.6  --  3.3* 3.3* 3.7  CL 102  --   --  107 105  CO2 27  --   --  25 22  GLUCOSE 143*  --   --  138* 256*  BUN 20  --   --  19 13  CREATININE 1.06*  --   --  0.96 0.93  CALCIUM 10.2  --   --  9.3 8.7*  MG  --  1.9  --   --  1.9  PHOS  --  2.9  --   --   --    Liver Function Tests: Recent Labs  Lab 11/20/21 2151 11/21/21 0507  AST 27 27  ALT 23 19  ALKPHOS 34* 31*  BILITOT 0.5 0.6  PROT 6.6 5.8*  ALBUMIN 3.1* 2.9*   No results for input(s): "LIPASE", "AMYLASE" in the last 168 hours. Recent Labs  Lab 11/21/21 1150  AMMONIA 34   CBC:  Recent Labs  Lab 11/20/21 1544 11/20/21 2207 11/21/21 0507 11/22/21 0454  WBC 10.7*  --  10.7* 15.2*  NEUTROABS 5.7  --   --   --   HGB 14.5 12.9 13.0 14.4  HCT 41.7 38.0 37.3 40.6  MCV 87.4  --  88.2 86.2  PLT 296  --  259 288   Cardiac Enzymes: Recent Labs  Lab 11/20/21 2151  CKTOTAL 72   BNP: Invalid input(s): "POCBNP" CBG: Recent Labs  Lab 11/21/21 2026 11/22/21 0023 11/22/21 0414 11/22/21 0811 11/22/21 1139  GLUCAP 176* 258* 263* 204* 186*   D-Dimer No results for input(s): "DDIMER" in the last 72 hours. Hgb A1c No results for input(s): "HGBA1C" in the last 72 hours. Lipid Profile No results for input(s): "CHOL", "HDL", "LDLCALC", "TRIG", "CHOLHDL", "LDLDIRECT" in the last 72 hours. Thyroid function studies Recent Labs    11/21/21 1150  TSH 0.972   Anemia work up Recent Labs    11/21/21 1150 11/22/21 0454  VITAMINB12 681  --   FOLATE  --  30.9   Urinalysis    Component Value Date/Time   COLORURINE YELLOW 11/20/2021 1608   APPEARANCEUR HAZY (A) 11/20/2021 1608   LABSPEC 1.014 11/20/2021 1608   PHURINE 5.0 11/20/2021 1608   GLUCOSEU 50 (A) 11/20/2021 1608   HGBUR SMALL (A) 11/20/2021 1608   BILIRUBINUR NEGATIVE 11/20/2021 1608   KETONESUR  NEGATIVE 11/20/2021 1608   PROTEINUR 100 (A) 11/20/2021 1608   NITRITE NEGATIVE 11/20/2021 1608   LEUKOCYTESUR LARGE (A) 11/20/2021 1608   Sepsis Labs Recent Labs  Lab 11/20/21 1544 11/21/21 0507 11/22/21 0454  WBC 10.7* 10.7* 15.2*   Microbiology Recent Results (from the past 240 hour(s))  Urine Culture     Status: None   Collection Time: 11/20/21  4:08 PM   Specimen: Urine, Catheterized  Result Value Ref Range Status   Specimen Description URINE, CATHETERIZED  Final   Special Requests NONE  Final   Culture   Final    NO GROWTH Performed at [facility], [address], [place], [place] [address]    Report Status 11/21/2021 FINAL  Final  SARS Coronavirus 2 by RT PCR (hospital order, performed in [facility] hospital lab) *cepheid single result test* Anterior Nasal Swab     Status: None   Collection Time: 11/20/21  9:35 PM   Specimen: Anterior Nasal Swab  Result Value Ref Range Status   SARS Coronavirus 2 by RT PCR NEGATIVE NEGATIVE Final    Comment: (NOTE) SARS-CoV-2 target nucleic acids are NOT DETECTED.  The SARS-CoV-2 RNA is generally detectable in upper and lower respiratory specimens during the acute phase of infection. The lowest concentration of SARS-CoV-2 viral copies this assay can detect is 250 copies / mL. A negative result does not preclude SARS-CoV-2 infection and should not be used as the sole basis for treatment or other patient management decisions.  A negative result may occur with improper specimen collection / handling, submission of specimen other than nasopharyngeal swab, presence of viral mutation(s) within the areas targeted by this assay, and inadequate number of viral copies (<250 copies / mL). A negative result must be combined with clinical observations, patient history, and epidemiological information.  Fact Sheet for Patients:   RoadLapTop.co.za  Fact Sheet for Healthcare  Providers: http://kim-miller.com/  This test is not yet approved or  cleared by the [place] FDA and has been authorized for detection and/or diagnosis of SARS-CoV-2 by FDA under an Emergency Use Authorization (  EUA).  This EUA will remain in effect (meaning this test can be used) for the duration of the COVID-19 declaration under Section 564(b)(1) of the Act, 21 U.S.C. section 360bbb-3(b)(1), unless the authorization is terminated or revoked sooner.  Performed at [facility], [place] [address] [place], [place] [address]      Time coordinating discharge: Over 30 minutes  SIGNED:   Mckinley Jewel, MD  [facility] 11/22/2021, 12:40 PM Pager   If 7PM-7AM, please contact night-coverage [email or web address]

## 2021-11-22 NOTE — Progress Notes (Addendum)
[id number] Civil engineer, contracting [facility]) Hospital Liaison Note  Notified by Elouise Munroe, NP, for [facility] Palliative services at home after discharge.  [facility] hospital liaison will follow patient for discharge disposition.  Please call with any hospice or outpatient palliative care related questions.  Thank you for the opportunity to participate in this patients care.  Renae Fickle MSN, RN [facility] Liaison [phone]

## 2021-11-22 NOTE — [place] FL2 (Signed)
[place] MEDICAID FL2 LEVEL OF CARE SCREENING TOOL     IDENTIFICATION  Patient Name: Natalie Peterson Birthdate: 03-Nov-1926 Sex: female Admission Date (Current Location): 11/20/2021  [facility] and [place] Number:  Producer, television/film/video and Address:  [facility] [place], [address] [place], [place] [address]      Provider Number: [id number]  Attending Physician Name and Address:  Ollen Bowl, MD  Relative Name and Phone Number:   Shunna Mikaelian [phone]      Current Level of Care: Hospital Recommended Level of Care: Skilled Nursing Facility Prior Approval Number:    Date Approved/Denied:   PASRR Number: [id number]  Discharge Plan: SNF    Current Diagnoses: Patient Active Problem List   Diagnosis Date Noted   Palliative care encounter    AMS (altered mental status) 11/21/2021   Acute metabolic encephalopathy 11/20/2021   Essential hypertension 11/20/2021   Hyperlipidemia 11/20/2021   Controlled type 2 diabetes mellitus without complication, without long-term current use of insulin (HCC) 11/20/2021   Dementia without behavioral disturbance (HCC) 11/20/2021   UTI (urinary tract infection) 11/20/2021   Fall 11/20/2021   History of CVA (cerebrovascular accident) 04/01/2018    Orientation RESPIRATION BLADDER Height & Weight     Time, Situation  Normal External catheter Weight: 133 lb 6.1 oz (60.5 kg) Height:  5' (152.4 cm)  BEHAVIORAL SYMPTOMS/MOOD NEUROLOGICAL BOWEL NUTRITION STATUS      Incontinent Diet (see discharge summary)  AMBULATORY STATUS COMMUNICATION OF NEEDS Skin   Limited Assist Verbally Normal                       Personal Care Assistance Level of Assistance  Bathing, Dressing Bathing Assistance: Limited assistance   Dressing Assistance: Limited assistance     Functional Limitations Info  Sight, Hearing Sight Info: Impaired Hearing Info: Impaired      SPECIAL CARE FACTORS FREQUENCY  PT (By licensed  PT), OT (By licensed OT)     PT Frequency: 2-3x/wk OT Frequency: 2-3x/wk            Contractures      Additional Factors Info  Code Status Code Status Info: DNR             Current Medications (11/22/2021):  This is the current hospital active medication list Current Facility-Administered Medications  Medication Dose Route Frequency Provider Last Rate Last Admin   acetaminophen (TYLENOL) tablet 650 mg  650 mg Oral Q6H PRN Therisa Doyne, MD   650 mg at 11/21/21 2035   Or   acetaminophen (TYLENOL) suppository 650 mg  650 mg Rectal Q6H PRN Doutova, Anastassia, MD       amLODipine (NORVASC) tablet 5 mg  5 mg Oral Daily Doutova, Anastassia, MD   5 mg at 11/22/21 0843   aspirin EC tablet 81 mg  81 mg Oral Daily Doutova, Anastassia, MD   81 mg at 11/22/21 0843   feeding supplement (GLUCERNA SHAKE) (GLUCERNA SHAKE) liquid 237 mL  237 mL Oral TID BM Pahwani, Rinka R, MD       fenofibrate tablet 160 mg  160 mg Oral Daily Doutova, Anastassia, MD   160 mg at 11/22/21 0843   guaiFENesin (ROBITUSSIN) 100 MG/5ML liquid 5 mL  5 mL Oral Q4H PRN Amin, Ankit Chirag, MD       hydrALAZINE (APRESOLINE) injection 10 mg  10 mg Intravenous Q4H PRN Amin, Loura Halt, MD       HYDROcodone-acetaminophen (NORCO/VICODIN)  5-325 MG per tablet 1-2 tablet  1-2 tablet Oral Q4H PRN Doutova, Anastassia, MD       insulin aspart (novoLOG) injection 0-9 Units  0-9 Units Subcutaneous TID AC & HS Pahwani, Rinka R, MD   2 Units at 11/22/21 1148   insulin glargine-yfgn (SEMGLEE) injection 5 Units  5 Units Subcutaneous QHS Doutova, Anastassia, MD   5 Units at 11/21/21 2034   ipratropium-albuterol (DUONEB) 0.5-2.5 (3) MG/3ML nebulizer solution 3 mL  3 mL Nebulization Q4H PRN Amin, Ankit Chirag, MD       metoprolol tartrate (LOPRESSOR) injection 5 mg  5 mg Intravenous Q4H PRN Amin, Ankit Chirag, MD       multivitamin with minerals tablet 1 tablet  1 tablet Oral Daily Pahwani, Rinka R, MD   1 tablet at 11/22/21 1148    oxyCODONE (Oxy IR/ROXICODONE) immediate release tablet 5 mg  5 mg Oral Q4H PRN Amin, Ankit Chirag, MD       traZODone (DESYREL) tablet 50 mg  50 mg Oral QHS PRN Dimple Nanas, MD   50 mg at 11/22/21 0027     Discharge Medications: Please see discharge summary for a list of discharge medications.  Relevant Imaging Results:  Relevant Lab Results:   Additional Information SSN: [id number]  Marya Landry Brentley Horrell, LCSWA

## 2021-11-22 NOTE — Progress Notes (Signed)
Initial Nutrition Assessment  DOCUMENTATION CODES:   Not applicable  INTERVENTION:  Continue current diet as ordered, encourage PO intake Glucerna Shake po TID, each supplement provides 220 kcal and 10 grams of protein MVI with minerals daily  NUTRITION DIAGNOSIS:   Increased nutrient needs related to acute illness (UTI) as evidenced by estimated needs.  GOAL:   Patient will meet greater than or equal to 90% of their needs  MONITOR:   PO intake, Supplement acceptance, Labs, Weight trends  REASON FOR ASSESSMENT:   Consult Assessment of nutrition requirement/status  ASSESSMENT:   Pt with hx of dementia, HTN, HLD, prior stroke, and DM presented to ED with confusion after a fall at her facility.  Unable to obtain a nutrition hx from pt due to confusion and dementia.   Reviewed chart and palliative care consulting, but daughter hopeful pt can return to ALF versus memory care at her facility. Daughter reports that pt has a great appetite at baseline but doesn't drink much fluids. Weight appropriate for age.   Will add glucerna as glucose has been elevated and pt's appetite is generally good.  Nutritionally Relevant Medications: Scheduled Meds:  fenofibrate  160 mg Oral Daily   insulin aspart  0-9 Units Subcutaneous TID AC & HS   insulin glargine-yfgn  5 Units Subcutaneous QHS   Continuous Infusions:  cefTRIAXone (ROCEPHIN)  IV 200 mL/hr at 11/21/21 1829   Labs Reviewed: Sodium 134 CBG ranges from 141-263 mg/dL over the last 24 hours HgbA1c 8.2% (04/03/18)  Micronutrient Labs: Folate: 30.9 Vitamin B12 681  NUTRITION - FOCUSED PHYSICAL EXAM: Defer to in-person assessment  Diet Order:   Diet Order             Diet Carb Modified Fluid consistency: Thin; Room service appropriate? Yes  Diet effective now                   EDUCATION NEEDS:   Not appropriate for education at this time  Skin:  Skin Assessment: Reviewed RN Assessment  Last BM:   7/22  Height:   Ht Readings from Last 1 Encounters:  11/21/21 5' (1.524 m)    Weight:   Wt Readings from Last 1 Encounters:  11/21/21 60.5 kg    Ideal Body Weight:  45.5 kg  BMI:  Body mass index is 26.05 kg/m.  Estimated Nutritional Needs:  Kcal:  1300-1500 kcal/d Protein:  65-80g/d Fluid:  >1.5L/d    Greig Castilla, RD, LDN Clinical Dietitian RD pager # available in [facility]  After hours/weekend pager # available in [facility] [facility]

## 2021-11-22 NOTE — TOC Transition Note (Signed)
Transition of Care [facility]) - CM/SW Discharge Note   Patient Details  Name: Natalie Peterson MRN: [id number] Date of Birth: 11/21/1926  [facility] CM/SW Contact:  Donnalee Curry, LCSWA Phone Number: 11/22/2021, 2:49 PM   Clinical Narrative:     SW spoke with pt's daughter Natalie Peterson [phone]) confirmed aware of d/c today. SW provided [place] with contact to call pt's room.   [facility] called  Final next level of care: Skilled Nursing Facility Barriers to Discharge: Barriers Resolved   Patient Goals and CMS Choice        Discharge Placement              Patient chooses bed at: [facility] Patient to be transferred to facility by: [facility] Name of family member notified: Natalie Peterson Patient and family notified of of transfer: 11/22/21  Discharge Plan and Services                                     Social Determinants of Health (SDOH) Interventions     Readmission Risk Interventions     No data to display

## 2021-11-22 NOTE — Consult Note (Signed)
Palliative Medicine  Name: Natalie Peterson Date: 11/22/2021 MRN: [id number]  DOB: 1926-12-09  Patient Care Team: Lajean Manes, MD as PCP - General (Internal Medicine) Fay Records, MD as PCP - Cardiology (Cardiology)    REASON FOR CONSULTATION: Natalie Peterson is a 88 y.o. female with multiple medical problems including, 2, hyperlipidemia, who was admitted to the hospital 11/20/2021 for encephalopathy secondary to UTI after an unwitnessed fall.  Palliative care was consulted to address goals.  SOCIAL HISTORY:     reports that she has never smoked. She has never used smokeless tobacco. She reports that she does not drink alcohol and does not use drugs.  Patient is widowed.  She lives at [facility] in [place].  Patient has a daughter in [place], another daughter in [place], and a son in [place].  ADVANCE DIRECTIVES:  Not on file  CODE STATUS: DNR  PAST MEDICAL HISTORY: Past Medical History:  Diagnosis Date   Dementia ([place])    Diabetes mellitus without complication ([place])    Hypertension     PAST SURGICAL HISTORY: History reviewed. No pertinent surgical history.  HEMATOLOGY/ONCOLOGY HISTORY:  Oncology History   No history exists.    ALLERGIES:  has No Known Allergies.  MEDICATIONS:  Current Facility-Administered Medications  Medication Dose Route Frequency Provider Last Rate Last Admin   acetaminophen (TYLENOL) tablet 650 mg  650 mg Oral Q6H PRN Toy Baker, MD   650 mg at 11/21/21 2035   Or   acetaminophen (TYLENOL) suppository 650 mg  650 mg Rectal Q6H PRN Toy Baker, MD       amLODipine (NORVASC) tablet 5 mg  5 mg Oral Daily Doutova, Anastassia, MD   5 mg at 11/22/21 0843   aspirin EC tablet 81 mg  81 mg Oral Daily Doutova, Nyoka Lint, MD   81 mg at 11/22/21 0843   cefTRIAXone (ROCEPHIN) 1 g in sodium chloride 0.9 % 100 mL IVPB  1 g Intravenous Q24H Toy Baker, MD 200 mL/hr at 11/21/21 1829 Infusion Verify at 11/21/21 1829    fenofibrate tablet 160 mg  160 mg Oral Daily Doutova, Anastassia, MD   160 mg at 11/22/21 0843   guaiFENesin (ROBITUSSIN) 100 MG/5ML liquid 5 mL  5 mL Oral Q4H PRN Amin, Ankit Chirag, MD       hydrALAZINE (APRESOLINE) injection 10 mg  10 mg Intravenous Q4H PRN Amin, Ankit Chirag, MD       HYDROcodone-acetaminophen (NORCO/VICODIN) 5-325 MG per tablet 1-2 tablet  1-2 tablet Oral Q4H PRN Doutova, Anastassia, MD       insulin aspart (novoLOG) injection 0-9 Units  0-9 Units Subcutaneous TID AC & HS Pahwani, Rinka R, MD   3 Units at 11/22/21 0844   insulin glargine-yfgn (SEMGLEE) injection 5 Units  5 Units Subcutaneous QHS Toy Baker, MD   5 Units at 11/21/21 2034   ipratropium-albuterol (DUONEB) 0.5-2.5 (3) MG/3ML nebulizer solution 3 mL  3 mL Nebulization Q4H PRN Amin, Ankit Chirag, MD       metoprolol tartrate (LOPRESSOR) injection 5 mg  5 mg Intravenous Q4H PRN Amin, Ankit Chirag, MD       oxyCODONE (Oxy IR/ROXICODONE) immediate release tablet 5 mg  5 mg Oral Q4H PRN Amin, Ankit Chirag, MD       traZODone (DESYREL) tablet 50 mg  50 mg Oral QHS PRN Amin, Ankit Chirag, MD   50 mg at 11/22/21 0027    VITAL SIGNS: BP (!) 142/68 (BP Location: Right Arm)   Pulse  86   Temp 98.7 F (37.1 C) (Axillary)   Resp 18   Ht 5' (1.524 m)   Wt 133 lb 6.1 oz (60.5 kg)   SpO2 94%   BMI 26.05 kg/m  Filed Weights   11/20/21 1455 11/21/21 1740  Weight: 146 lb 2.6 oz (66.3 kg) 133 lb 6.1 oz (60.5 kg)    Estimated body mass index is 26.05 kg/m as calculated from the following:   Height as of this encounter: 5' (1.524 m).   Weight as of this encounter: 133 lb 6.1 oz (60.5 kg).  LABS: CBC:    Component Value Date/Time   WBC 15.2 (H) 11/22/2021 0454   HGB 14.4 11/22/2021 0454   HCT 40.6 11/22/2021 0454   PLT 288 11/22/2021 0454   MCV 86.2 11/22/2021 0454   NEUTROABS 5.7 11/20/2021 1544   LYMPHSABS 3.5 11/20/2021 1544   MONOABS 1.0 11/20/2021 1544   EOSABS 0.4 11/20/2021 1544   BASOSABS 0.1  11/20/2021 1544   Comprehensive Metabolic Panel:    Component Value Date/Time   NA 134 (L) 11/22/2021 0454   K 3.7 11/22/2021 0454   CL 105 11/22/2021 0454   CO2 22 11/22/2021 0454   BUN 13 11/22/2021 0454   CREATININE 0.93 11/22/2021 0454   GLUCOSE 256 (H) 11/22/2021 0454   CALCIUM 8.7 (L) 11/22/2021 0454   AST 27 11/21/2021 0507   ALT 19 11/21/2021 0507   ALKPHOS 31 (L) 11/21/2021 0507   BILITOT 0.6 11/21/2021 0507   PROT 5.8 (L) 11/21/2021 0507   ALBUMIN 2.9 (L) 11/21/2021 0507    RADIOGRAPHIC STUDIES: ECHOCARDIOGRAM COMPLETE  Result Date: 11/21/2021    ECHOCARDIOGRAM REPORT   Patient Name:   Natalie Peterson Date of Exam: 11/21/2021 Medical Rec #:  [id number]           Height: Accession #:    [id number]          Weight:       146.2 lb Date of Birth:  1926/11/18           BSA:          1.638 m Patient Age:    53 years            BP:           145/53 mmHg Patient Gender: F                   HR:           78 bpm. Exam Location:  Inpatient Procedure: 2D Echo, Cardiac Doppler and Color Doppler Indications:    Syncope  History:        Patient has prior history of Echocardiogram examinations, most                 recent 04/03/2018. Risk Factors:Diabetes and Hypertension.  Sonographer:    Jefferey Pica Referring Phys: [place]  1. Left ventricular ejection fraction, by estimation, is 60 to 65%. The left ventricle has normal function. The left ventricle has no regional wall motion abnormalities. There is mild left ventricular hypertrophy. Left ventricular diastolic parameters are consistent with Grade I diastolic dysfunction (impaired relaxation).  2. Right ventricular systolic function is normal. The right ventricular size is normal.  3. Left atrial size was mildly dilated.  4. The mitral valve is abnormal. Mild mitral valve regurgitation. No evidence of mitral stenosis. Moderate mitral annular calcification.  5. The aortic valve is tricuspid. There is moderate  calcification  of the aortic valve. There is moderate thickening of the aortic valve. Aortic valve regurgitation is mild. Aortic valve sclerosis/calcification is present, without any evidence of aortic stenosis.  6. The inferior vena cava is normal in size with greater than 50% respiratory variability, suggesting right atrial pressure of 3 mmHg. FINDINGS  Left Ventricle: Left ventricular ejection fraction, by estimation, is 60 to 65%. The left ventricle has normal function. The left ventricle has no regional wall motion abnormalities. The left ventricular internal cavity size was normal in size. There is  mild left ventricular hypertrophy. Left ventricular diastolic parameters are consistent with Grade I diastolic dysfunction (impaired relaxation). Right Ventricle: The right ventricular size is normal. No increase in right ventricular wall thickness. Right ventricular systolic function is normal. Left Atrium: Left atrial size was mildly dilated. Right Atrium: Right atrial size was normal in size. Pericardium: There is no evidence of pericardial effusion. Mitral Valve: The mitral valve is abnormal. There is moderate thickening of the mitral valve leaflet(s). There is moderate calcification of the mitral valve leaflet(s). Moderate mitral annular calcification. Mild mitral valve regurgitation. No evidence of mitral valve stenosis. Tricuspid Valve: The tricuspid valve is normal in structure. Tricuspid valve regurgitation is not demonstrated. No evidence of tricuspid stenosis. Aortic Valve: The aortic valve is tricuspid. There is moderate calcification of the aortic valve. There is moderate thickening of the aortic valve. Aortic valve regurgitation is mild. Aortic regurgitation PHT measures 391 msec. Aortic valve sclerosis/calcification is present, without any evidence of aortic stenosis. Aortic valve peak gradient measures 10.9 mmHg. Pulmonic Valve: The pulmonic valve was normal in structure. Pulmonic valve regurgitation  is not visualized. No evidence of pulmonic stenosis. Aorta: The aortic root is normal in size and structure. Venous: The inferior vena cava is normal in size with greater than 50% respiratory variability, suggesting right atrial pressure of 3 mmHg. IAS/Shunts: No atrial level shunt detected by color flow Doppler.  LEFT VENTRICLE PLAX 2D LVIDd:         4.10 cm   Diastology LVIDs:         2.00 cm   LV e' medial:    4.43 cm/s LV PW:         1.20 cm   LV E/e' medial:  12.9 LV IVS:        1.20 cm   LV e' lateral:   4.88 cm/s LVOT diam:     1.80 cm   LV E/e' lateral: 11.7 LV SV:         88 LV SV Index:   54 LVOT Area:     2.54 cm  RIGHT VENTRICLE RV Basal diam:  2.70 cm RV S prime:     11.10 cm/s TAPSE (M-mode): 2.0 cm LEFT ATRIUM             Index        RIGHT ATRIUM          Index LA diam:        3.80 cm 2.32 cm/m   RA Area:     8.01 cm LA Vol (A2C):   50.0 ml 30.52 ml/m  RA Volume:   13.20 ml 8.06 ml/m LA Vol (A4C):   49.2 ml 30.03 ml/m LA Biplane Vol: 50.4 ml 30.76 ml/m  AORTIC VALVE                  PULMONIC VALVE AV Area (Vmax): 2.51 cm      PV Vmax:  1.28 m/s AV Vmax:        165.00 cm/s   PV Peak grad:  6.6 mmHg AV Peak Grad:   10.9 mmHg LVOT Vmax:      163.00 cm/s LVOT Vmean:     105.000 cm/s LVOT VTI:       0.347 m AI PHT:         391 msec  AORTA Ao Root diam: 3.30 cm Ao Asc diam:  3.20 cm MITRAL VALVE                TRICUSPID VALVE MV Area (PHT): 3.27 cm     TR Peak grad:   30.5 mmHg MV Decel Time: 232 msec     TR Vmax:        276.00 cm/s MV E velocity: 57.30 cm/s MV A velocity: 121.00 cm/s  SHUNTS MV E/A ratio:  0.47         Systemic VTI:  0.35 m                             Systemic Diam: 1.80 cm Charlton Haws MD Electronically signed by Charlton Haws MD Signature Date/Time: 11/21/2021/3:42:48 PM    Final    CT Head Wo Contrast  Result Date: 11/20/2021 CLINICAL DATA:  Unwitnessed fall this morning. EXAM: CT HEAD WITHOUT CONTRAST CT CERVICAL SPINE WITHOUT CONTRAST TECHNIQUE: Multidetector CT  imaging of the head and cervical spine was performed following the standard protocol without intravenous contrast. Multiplanar CT image reconstructions of the cervical spine were also generated. RADIATION DOSE REDUCTION: This exam was performed according to the departmental dose-optimization program which includes automated exposure control, adjustment of the mA and/or kV according to patient size and/or use of iterative reconstruction technique. COMPARISON:  09/15/2018 FINDINGS: CT HEAD FINDINGS Brain: No evidence of acute infarction, hemorrhage, extra-axial collection, ventriculomegaly, or mass effect. Old left parietal lobe infarct. Generalized cerebral atrophy. Periventricular white matter low attenuation likely secondary to microangiopathy. Vascular: Cerebrovascular atherosclerotic calcifications are noted. No hyperdense vessels. Skull: Negative for fracture or focal lesion. Sinuses/Orbits: Visualized portions of the orbits are unremarkable. Visualized portions of the paranasal sinuses are unremarkable. Visualized portions of the mastoid air cells are unremarkable. Other: None. CT CERVICAL SPINE FINDINGS Alignment: 2 mm anterolisthesis of C6 on C7. Skull base and vertebrae: No acute fracture. No primary bone lesion or focal pathologic process. Soft tissues and spinal canal: No prevertebral fluid or swelling. No visible canal hematoma. Disc levels: Mild degenerative disease with disc height loss at C3-4, C4-5 and C7-T1. Bilateral facet arthropathy throughout the cervical spine. Broad-based disc osteophyte complex at C3-4 with bilateral uncovertebral degenerative changes. Right foraminal stenosis at C3-4. Mild left foraminal stenosis at C2-3.6 Upper chest: Lung apices are clear. Other: No fluid collection or hematoma. IMPRESSION: 1. No acute intracranial pathology. 2. Old left parietal lobe infarct. 3. No acute fracture or subluxation of the cervical spine. Electronically Signed   By: Elige Ko M.D.   On:  11/20/2021 18:01   CT Cervical Spine Wo Contrast  Result Date: 11/20/2021 CLINICAL DATA:  Unwitnessed fall this morning. EXAM: CT HEAD WITHOUT CONTRAST CT CERVICAL SPINE WITHOUT CONTRAST TECHNIQUE: Multidetector CT imaging of the head and cervical spine was performed following the standard protocol without intravenous contrast. Multiplanar CT image reconstructions of the cervical spine were also generated. RADIATION DOSE REDUCTION: This exam was performed according to the departmental dose-optimization program which includes automated exposure control, adjustment of the mA  and/or kV according to patient size and/or use of iterative reconstruction technique. COMPARISON:  09/15/2018 FINDINGS: CT HEAD FINDINGS Brain: No evidence of acute infarction, hemorrhage, extra-axial collection, ventriculomegaly, or mass effect. Old left parietal lobe infarct. Generalized cerebral atrophy. Periventricular white matter low attenuation likely secondary to microangiopathy. Vascular: Cerebrovascular atherosclerotic calcifications are noted. No hyperdense vessels. Skull: Negative for fracture or focal lesion. Sinuses/Orbits: Visualized portions of the orbits are unremarkable. Visualized portions of the paranasal sinuses are unremarkable. Visualized portions of the mastoid air cells are unremarkable. Other: None. CT CERVICAL SPINE FINDINGS Alignment: 2 mm anterolisthesis of C6 on C7. Skull base and vertebrae: No acute fracture. No primary bone lesion or focal pathologic process. Soft tissues and spinal canal: No prevertebral fluid or swelling. No visible canal hematoma. Disc levels: Mild degenerative disease with disc height loss at C3-4, C4-5 and C7-T1. Bilateral facet arthropathy throughout the cervical spine. Broad-based disc osteophyte complex at C3-4 with bilateral uncovertebral degenerative changes. Right foraminal stenosis at C3-4. Mild left foraminal stenosis at C2-3.6 Upper chest: Lung apices are clear. Other: No fluid  collection or hematoma. IMPRESSION: 1. No acute intracranial pathology. 2. Old left parietal lobe infarct. 3. No acute fracture or subluxation of the cervical spine. Electronically Signed   By: Kathreen Devoid M.D.   On: 11/20/2021 18:01   DG Chest Port 1 View  Result Date: 11/20/2021 CLINICAL DATA:  Unwitnessed fall today. Possible head injury. Altered mental status. EXAM: PORTABLE CHEST 1 VIEW COMPARISON:  Radiographs 04/01/2018. FINDINGS: 1527 hours. There is patient rotation to the left. Allowing for this, the heart size and mediastinal contours are stable with aortic atherosclerosis. No evidence of mediastinal hematoma. Chronic elevation of the right hemidiaphragm with associated mild bibasilar atelectasis or scarring, similar to prior examination. No confluent airspace opacity, significant pleural effusion or pneumothorax. No acute fractures are identified. Telemetry leads overlie the chest. IMPRESSION: No evidence of acute posttraumatic findings or active cardiopulmonary process. Bibasilar atelectasis or scarring, similar to prior study. Electronically Signed   By: Richardean Sale M.D.   On: 11/20/2021 15:41   DG Pelvis Portable  Result Date: 11/20/2021 CLINICAL DATA:  Unwitnessed fall this morning.  Dementia. EXAM: PORTABLE PELVIS 1-2 VIEWS COMPARISON:  None Available. FINDINGS: No evidence of acute pelvic fracture or diastasis of the sacroiliac joints or symphysis pubis. There are mild degenerative changes at both hips and throughout the lumbar spine. The soft tissues appear unremarkable. Telemetry leads overlie the abdomen. IMPRESSION: No evidence of acute pelvic fracture or dislocation. Electronically Signed   By: Richardean Sale M.D.   On: 11/20/2021 15:39    PERFORMANCE STATUS (ECOG) : 2 - Symptomatic, <50% confined to bed  Review of Systems Unless otherwise noted, a complete review of systems is negative.  Physical Exam General: NAD Pulmonary: Unlabored Extremities: no edema, no joint  deformities Skin: no rashes Neurological: Weakness but otherwise nonfocal  IMPRESSION: Patient is pleasantly confused.  She was sleeping but wakes to stimulation.  She is hard of hearing.  Given her dementia, she lacks insight into her current health conditions and therefore was unable to reasonably discuss goals.  I called and spoke with her daughter, Vaughan Basta.  Daughter says that patient has had some decline recently requiring more assistance with ADLs.  At baseline, patient is ambulatory with use of a walker and able to get herself out of bed independently to go to the bathroom.  Patient has a robust appetite but does not drink much fluids.  Patient is normally pleasantly  confused but engaging in conversation.  Daughter hopes that patient will be able to return to ALF and avoid memory care.  We discussed the option of having patient followed by palliative care and daughter thought that was a good idea.  We will speak with the [facility] liaison to coordinate.   It is hopeful that patient will return to her previous baseline, although daughter recognizes the patient will likely decline in the future.  Daughter confirms DNR.  She states that she is patient's healthcare power of attorney.  PLAN: -Continue current scope of treatment -Dispo: Likely ALF with palliative care following   Time Total: 50 minutes  Visit consisted of counseling and education dealing with the complex and emotionally intense issues of symptom management and palliative care in the setting of serious and potentially life-threatening illness.Greater than 50%  of this time was spent counseling and coordinating care related to the above assessment and plan.  Signed by: Altha Harm, PhD, NP-C

## 2021-11-24 DIAGNOSIS — Z9181 History of falling: Secondary | ICD-10-CM | POA: Diagnosis not present

## 2021-11-24 DIAGNOSIS — M6281 Muscle weakness (generalized): Secondary | ICD-10-CM | POA: Diagnosis not present

## 2021-11-24 DIAGNOSIS — R41841 Cognitive communication deficit: Secondary | ICD-10-CM | POA: Diagnosis not present

## 2021-11-24 DIAGNOSIS — R2689 Other abnormalities of gait and mobility: Secondary | ICD-10-CM | POA: Diagnosis not present

## 2021-11-26 ENCOUNTER — Non-Acute Institutional Stay: Payer: Medicare Other | Admitting: Internal Medicine

## 2021-11-26 VITALS — BP 103/72 | HR 76 | Temp 98.3°F | Resp 18 | Wt 131.0 lb

## 2021-11-26 DIAGNOSIS — Z8673 Personal history of transient ischemic attack (TIA), and cerebral infarction without residual deficits: Secondary | ICD-10-CM | POA: Diagnosis not present

## 2021-11-26 DIAGNOSIS — M6281 Muscle weakness (generalized): Secondary | ICD-10-CM | POA: Diagnosis not present

## 2021-11-26 DIAGNOSIS — R296 Repeated falls: Secondary | ICD-10-CM | POA: Diagnosis not present

## 2021-11-26 DIAGNOSIS — Z515 Encounter for palliative care: Secondary | ICD-10-CM | POA: Diagnosis not present

## 2021-11-26 DIAGNOSIS — F0390 Unspecified dementia without behavioral disturbance: Secondary | ICD-10-CM

## 2021-11-26 NOTE — Progress Notes (Signed)
[facility] Pall[facility] Consult Note Telephone: [phone]  Fax: [phone]   Date of encounter: 12/01/21 9:28 PM PATIENT NAME: Natalie Peterson [place] [address]   [phone] (home) [phone] (work) DOB: 04/09/1927 MRN: [id number] PRIMARY CARE PROVIDER:    Lajean Manes, MD,  [place] [employer] [address] [place] [address] [phone]  REFERRING PROVIDER:   Nilda Simmer, NP  RESPONSIBLE PARTY:    Contact Information     Name Relation Home Work Mobile   MASONIC,HOME  [phone]     Glade,[name] Daughter [phone]  [phone]   Riti, Rollyson Daughter [phone]          I met face to face with patient and family in [facility] facility. Palliative Care was asked to follow this patient by consultation request of  Nilda Simmer, NP to address advance care planning and complex medical decision making. This is the initial visit.                                     ASSESSMENT AND PLAN / RECOMMENDATIONS:   Advance Care Planning/Goals of Care: Goals are to maximize quality of life and symptom management. Patient/health care surrogate gave his/her permission to discuss.Our advance care planning conversation included a discussion about:    The value and importance of advance care planning  Experiences with loved ones who have been seriously ill or have died  Exploration of personal, cultural or spiritual beliefs that might influence medical decisions  Exploration of goals of care in the event of a sudden injury or illness  Identification  of a healthcare agent  Review and updating or creation of an  advance directive document . Decision not to resuscitate or to de-escalate disease focused treatments due to poor prognosis. CODE STATUS:  DNR, Natalie Peterson has original.  She is 8 hrs away and quicker by plane.  We will do a new one to have it here on file.   Pt has clearly said she does not want to be  put on a ventilator.    Symptom Management/Plan: 1. Dementia without behavioral disturbance ([place]) -progressing gradually and prone to delirium with acute illness -spoke with her dtr, Natalie Peterson, [place] and she plans to visit again in August   2. History of CVA (cerebrovascular accident) -notable so some vascular component plus already had AD dx  3. Palliative care encounter -DNR done, will do MOST with Natalie Peterson when she's in town visiting in august    Follow up Palliative Care Visit: Palliative care will continue to follow for complex medical decision making, advance care planning, and clarification of goals. Return in august when [place] notifies [place] she is here and prn.   This visit was coded based on medical decision making (MDM). 20 mins spent on ACP with Natalie Peterson  PPS: 40%  HOSPICE ELIGIBILITY/DIAGNOSIS: not at this time  Chief Complaint: initial palliaitve consult at [facility]  HISTORY OF PRESENT ILLNESS:  Natalie Peterson is a 88 y.o. year old female  with cerebrovascular disease and stroke 11/19 (affected cognition further, already had just had a dx of AD [place]), mixed AD and vascular dementia, htn, hyperlipidemia, DMII with ckd3b, fall, delirium and UTI that led to hospitalization 7/20-22/23 and subsequent admission here for rehab at [facility]. She is a resident of this retirement community.  She had delirium and was  found to have UTI and treated with rocephin.  Ct brain was negative for acute event and pelvic films negative for fx, CXR negative for pneumonia.  She actually wound up with a  negative urine cx and rocephin was d/cd.  Other delirium labs were unremarkable.  Palliative was consulted and outpatient f/u recommended.  Initially, her creatinine had bumped slightly and she rehydrated with resolution.  Baseline, she's oriented x 1-2 and was back to that baseline per hospital d/c summary .   After her stroke, she bathed and dressed herself and  got around fairly well.    Natalie Peterson was here the last of April and they have weekly visits virtually on Wednesdays.    Last week, she was talking, not herself, seeing things and that's why she was sent out to the hospital.    Likes frequent snacks with her diabetes though not always eating everything.   In May, pt was at "level 6"--needs help with adls.  Still knows Natalie Peterson.  Has forgotten her other children who live in [place] (son and dtr).  She is HOH.  She can hear with them.  Right is the better ear if not.    She's had speech therapy and needs clues to remember some things.  She also needs to be told, this is the time we... rather than do you want to?  She mentioned some pain in her hand which had a small bruise on it.    Does have occasional incontinence.    Buffy Crouse does bills here at [facility] for pt.    On mechanical soft diet due to struggles with tremor when trying to eat on her own.  She doesn't drink adequate water but is encouraged.  History obtained from review of EMR, discussion with primary team, and interview with family, facility staff/caregiver and/or Natalie Peterson.   I reviewed available labs, medications, imaging, studies and related documents from the EMR.  Records reviewed and summarized above.   ROS  See hpi  Physical Exam: Today's Vitals   11/26/21 2127  BP: 103/72  Pulse: 76  Resp: 18  Temp: 98.3 F (36.8 C)  SpO2: 100%  Weight: 131 lb (59.4 kg)   Body mass index is 25.58 kg/m. Physical Exam Constitutional:      Appearance: Normal appearance.  HENT:     Right Ear: External ear normal.     Left Ear: External ear normal.     Mouth/Throat:     Pharynx: Oropharynx is clear.  Eyes:     Conjunctiva/sclera: Conjunctivae normal.     Pupils: Pupils are equal, round, and reactive to light.  Cardiovascular:     Rate and Rhythm: Normal rate and regular rhythm.  Pulmonary:     Effort: Pulmonary effort is normal.     Breath sounds: Normal breath  sounds.  Abdominal:     General: Bowel sounds are normal.  Musculoskeletal:        General: Normal range of motion.     Right lower leg: No edema.     Left lower leg: No edema.  Skin:    General: Skin is warm and dry.  Neurological:     General: No focal deficit present.     Mental Status: She is alert.     Gait: Gait abnormal.  Psychiatric:        Mood and Affect: Mood normal.      CURRENT PROBLEM LIST:  Patient Active Problem List   Diagnosis Date Noted   Palliative  care encounter    AMS (altered mental status) 40/98/1191   Acute metabolic encephalopathy 47/82/9562   Essential hypertension 11/20/2021   Hyperlipidemia 11/20/2021   Controlled type 2 diabetes mellitus without complication, without long-term current use of insulin ([place]) 11/20/2021   Dementia without behavioral disturbance ([place]) 11/20/2021   UTI (urinary tract infection) 11/20/2021   Fall 11/20/2021   History of CVA (cerebrovascular accident) 04/01/2018   PAST MEDICAL HISTORY:  Active Ambulatory Problems    Diagnosis Date Noted   History of CVA (cerebrovascular accident) 13/12/6576   Acute metabolic encephalopathy 46/96/2952   Essential hypertension 11/20/2021   Hyperlipidemia 11/20/2021   Controlled type 2 diabetes mellitus without complication, without long-term current use of insulin ([place]) 11/20/2021   Dementia without behavioral disturbance ([place]) 11/20/2021   UTI (urinary tract infection) 11/20/2021   Fall 11/20/2021   AMS (altered mental status) 11/21/2021   Palliative care encounter    Resolved Ambulatory Problems    Diagnosis Date Noted   No Resolved Ambulatory Problems   Past Medical History:  Diagnosis Date   Dementia ([place])    Diabetes mellitus without complication ([place])    Hypertension    SOCIAL HX:  Social History   Tobacco Use   Smoking status: Never   Smokeless tobacco: Never  Substance Use Topics   Alcohol use: No   FAMILY HX:  Family History  Problem Relation Age of  Onset   Hypertension Mother    Stroke Mother    Hypertension Father    Stroke Father       ALLERGIES: No Known Allergies   PERTINENT MEDICATIONS:  Outpatient Encounter Medications as of 11/26/2021  Medication Sig   acetaminophen (TYLENOL) 500 MG tablet Take 500 mg by mouth every 4 (four) hours as needed for mild pain.   amLODipine (NORVASC) 5 MG tablet Take 5 mg by mouth daily.   aspirin EC 81 MG tablet Take 81 mg by mouth daily. Swallow whole.   cholecalciferol (VITAMIN D) 25 MCG (1000 UNIT) tablet Take 1,000 Units by mouth daily.   fenofibrate (TRICOR) 48 MG tablet Take 48 mg by mouth daily.   fluocinonide (LIDEX) 0.05 % external solution Apply 1 application topically 2 (two) times daily as needed (scalp).   hydrochlorothiazide (MICROZIDE) 12.5 MG capsule Take 12.5 mg by mouth daily.   insulin glargine (LANTUS SOLOSTAR) 100 UNIT/ML Solostar Pen Inject 5 Units into the skin at bedtime.   Melatonin 5 MG CAPS Take by mouth daily. (Patient not taking: Reported on 11/20/2021)   metFORMIN (GLUCOPHAGE) 500 MG tablet Take 500 mg by mouth daily.   Multiple Vitamins-Minerals (CEROVITE PO) Take 1 tablet by mouth daily.   Nutritional Supplements (NUTRITIONAL DRINK PO) Take 120 mLs by mouth in the morning, at noon, and at bedtime. Med Pass 2.0   Polyethyl Glycol-Propyl Glycol (SYSTANE ULTRA) 0.4-0.3 % SOLN Apply 1 drop to eye 4 (four) times daily as needed (dry eyes/eye irritation).   polyethylene glycol (MIRALAX / GLYCOLAX) 17 g packet Take 17 g by mouth daily.   PRIMIDONE PO Take 12.5 mg by mouth at bedtime.   sennosides-docusate sodium (SENOKOT-S) 8.6-50 MG tablet Take 1 tablet by mouth at bedtime.   sitaGLIPtin (JANUVIA) 50 MG tablet Take 50 mg by mouth daily.   No facility-administered encounter medications on file as of 11/26/2021.   Thank you for the opportunity to participate in the care of Natalie Peterson.  The palliative care team will continue to follow. Please call our office at  [phone] if we  can be of additional assistance.   Hollace Kinnier, DO   COVID-19 PATIENT SCREENING TOOL Asked and negative response unless otherwise noted:  Have you had symptoms of covid, tested positive or been in contact with someone with symptoms/positive test in the past 5-10 days? no

## 2021-11-28 DIAGNOSIS — R296 Repeated falls: Secondary | ICD-10-CM | POA: Diagnosis not present

## 2021-11-28 DIAGNOSIS — M6281 Muscle weakness (generalized): Secondary | ICD-10-CM | POA: Diagnosis not present

## 2021-12-01 ENCOUNTER — Encounter: Payer: Self-pay | Admitting: Internal Medicine

## 2021-12-02 DIAGNOSIS — R296 Repeated falls: Secondary | ICD-10-CM | POA: Diagnosis not present

## 2021-12-02 DIAGNOSIS — R2689 Other abnormalities of gait and mobility: Secondary | ICD-10-CM | POA: Diagnosis not present

## 2021-12-02 DIAGNOSIS — R279 Unspecified lack of coordination: Secondary | ICD-10-CM | POA: Diagnosis not present

## 2021-12-02 DIAGNOSIS — R2681 Unsteadiness on feet: Secondary | ICD-10-CM | POA: Diagnosis not present

## 2021-12-02 DIAGNOSIS — M6281 Muscle weakness (generalized): Secondary | ICD-10-CM | POA: Diagnosis not present

## 2021-12-04 DIAGNOSIS — M6281 Muscle weakness (generalized): Secondary | ICD-10-CM | POA: Diagnosis not present

## 2021-12-04 DIAGNOSIS — R2681 Unsteadiness on feet: Secondary | ICD-10-CM | POA: Diagnosis not present

## 2021-12-04 DIAGNOSIS — R296 Repeated falls: Secondary | ICD-10-CM | POA: Diagnosis not present

## 2021-12-04 DIAGNOSIS — R279 Unspecified lack of coordination: Secondary | ICD-10-CM | POA: Diagnosis not present

## 2021-12-04 DIAGNOSIS — R2689 Other abnormalities of gait and mobility: Secondary | ICD-10-CM | POA: Diagnosis not present

## 2021-12-05 DIAGNOSIS — R296 Repeated falls: Secondary | ICD-10-CM | POA: Diagnosis not present

## 2021-12-05 DIAGNOSIS — R2681 Unsteadiness on feet: Secondary | ICD-10-CM | POA: Diagnosis not present

## 2021-12-05 DIAGNOSIS — R2689 Other abnormalities of gait and mobility: Secondary | ICD-10-CM | POA: Diagnosis not present

## 2021-12-05 DIAGNOSIS — R279 Unspecified lack of coordination: Secondary | ICD-10-CM | POA: Diagnosis not present

## 2021-12-05 DIAGNOSIS — M6281 Muscle weakness (generalized): Secondary | ICD-10-CM | POA: Diagnosis not present

## 2021-12-09 DIAGNOSIS — M6281 Muscle weakness (generalized): Secondary | ICD-10-CM | POA: Diagnosis not present

## 2021-12-09 DIAGNOSIS — R279 Unspecified lack of coordination: Secondary | ICD-10-CM | POA: Diagnosis not present

## 2021-12-09 DIAGNOSIS — R296 Repeated falls: Secondary | ICD-10-CM | POA: Diagnosis not present

## 2021-12-09 DIAGNOSIS — R2681 Unsteadiness on feet: Secondary | ICD-10-CM | POA: Diagnosis not present

## 2021-12-09 DIAGNOSIS — R2689 Other abnormalities of gait and mobility: Secondary | ICD-10-CM | POA: Diagnosis not present

## 2021-12-11 DIAGNOSIS — R2681 Unsteadiness on feet: Secondary | ICD-10-CM | POA: Diagnosis not present

## 2021-12-11 DIAGNOSIS — R2689 Other abnormalities of gait and mobility: Secondary | ICD-10-CM | POA: Diagnosis not present

## 2021-12-11 DIAGNOSIS — R279 Unspecified lack of coordination: Secondary | ICD-10-CM | POA: Diagnosis not present

## 2021-12-11 DIAGNOSIS — R296 Repeated falls: Secondary | ICD-10-CM | POA: Diagnosis not present

## 2021-12-11 DIAGNOSIS — M6281 Muscle weakness (generalized): Secondary | ICD-10-CM | POA: Diagnosis not present

## 2021-12-12 DIAGNOSIS — R2689 Other abnormalities of gait and mobility: Secondary | ICD-10-CM | POA: Diagnosis not present

## 2021-12-12 DIAGNOSIS — M6281 Muscle weakness (generalized): Secondary | ICD-10-CM | POA: Diagnosis not present

## 2021-12-12 DIAGNOSIS — R2681 Unsteadiness on feet: Secondary | ICD-10-CM | POA: Diagnosis not present

## 2021-12-12 DIAGNOSIS — R279 Unspecified lack of coordination: Secondary | ICD-10-CM | POA: Diagnosis not present

## 2021-12-12 DIAGNOSIS — R296 Repeated falls: Secondary | ICD-10-CM | POA: Diagnosis not present

## 2021-12-16 DIAGNOSIS — R279 Unspecified lack of coordination: Secondary | ICD-10-CM | POA: Diagnosis not present

## 2021-12-16 DIAGNOSIS — R296 Repeated falls: Secondary | ICD-10-CM | POA: Diagnosis not present

## 2021-12-16 DIAGNOSIS — R2689 Other abnormalities of gait and mobility: Secondary | ICD-10-CM | POA: Diagnosis not present

## 2021-12-16 DIAGNOSIS — M6281 Muscle weakness (generalized): Secondary | ICD-10-CM | POA: Diagnosis not present

## 2021-12-16 DIAGNOSIS — R2681 Unsteadiness on feet: Secondary | ICD-10-CM | POA: Diagnosis not present

## 2021-12-17 DIAGNOSIS — R059 Cough, unspecified: Secondary | ICD-10-CM | POA: Diagnosis not present

## 2021-12-17 DIAGNOSIS — H9190 Unspecified hearing loss, unspecified ear: Secondary | ICD-10-CM | POA: Diagnosis not present

## 2021-12-17 DIAGNOSIS — Z7689 Persons encountering health services in other specified circumstances: Secondary | ICD-10-CM | POA: Diagnosis not present

## 2021-12-17 DIAGNOSIS — J302 Other seasonal allergic rhinitis: Secondary | ICD-10-CM | POA: Diagnosis not present

## 2021-12-17 DIAGNOSIS — R41841 Cognitive communication deficit: Secondary | ICD-10-CM | POA: Diagnosis not present

## 2021-12-18 DIAGNOSIS — R2689 Other abnormalities of gait and mobility: Secondary | ICD-10-CM | POA: Diagnosis not present

## 2021-12-18 DIAGNOSIS — Z79899 Other long term (current) drug therapy: Secondary | ICD-10-CM | POA: Diagnosis not present

## 2021-12-18 DIAGNOSIS — I10 Essential (primary) hypertension: Secondary | ICD-10-CM | POA: Diagnosis not present

## 2021-12-18 DIAGNOSIS — R296 Repeated falls: Secondary | ICD-10-CM | POA: Diagnosis not present

## 2021-12-18 DIAGNOSIS — H1010 Acute atopic conjunctivitis, unspecified eye: Secondary | ICD-10-CM | POA: Diagnosis not present

## 2021-12-18 DIAGNOSIS — R279 Unspecified lack of coordination: Secondary | ICD-10-CM | POA: Diagnosis not present

## 2021-12-18 DIAGNOSIS — R2681 Unsteadiness on feet: Secondary | ICD-10-CM | POA: Diagnosis not present

## 2021-12-18 DIAGNOSIS — Z7689 Persons encountering health services in other specified circumstances: Secondary | ICD-10-CM | POA: Diagnosis not present

## 2021-12-18 DIAGNOSIS — M6281 Muscle weakness (generalized): Secondary | ICD-10-CM | POA: Diagnosis not present

## 2021-12-19 DIAGNOSIS — R2681 Unsteadiness on feet: Secondary | ICD-10-CM | POA: Diagnosis not present

## 2021-12-19 DIAGNOSIS — R2689 Other abnormalities of gait and mobility: Secondary | ICD-10-CM | POA: Diagnosis not present

## 2021-12-19 DIAGNOSIS — M6281 Muscle weakness (generalized): Secondary | ICD-10-CM | POA: Diagnosis not present

## 2021-12-19 DIAGNOSIS — R279 Unspecified lack of coordination: Secondary | ICD-10-CM | POA: Diagnosis not present

## 2021-12-19 DIAGNOSIS — R296 Repeated falls: Secondary | ICD-10-CM | POA: Diagnosis not present

## 2021-12-19 DIAGNOSIS — R41841 Cognitive communication deficit: Secondary | ICD-10-CM | POA: Diagnosis not present

## 2021-12-19 DIAGNOSIS — E1165 Type 2 diabetes mellitus with hyperglycemia: Secondary | ICD-10-CM | POA: Diagnosis not present

## 2021-12-19 DIAGNOSIS — N1831 Chronic kidney disease, stage 3a: Secondary | ICD-10-CM | POA: Diagnosis not present

## 2021-12-19 DIAGNOSIS — E860 Dehydration: Secondary | ICD-10-CM | POA: Diagnosis not present

## 2021-12-23 DIAGNOSIS — R2681 Unsteadiness on feet: Secondary | ICD-10-CM | POA: Diagnosis not present

## 2021-12-23 DIAGNOSIS — R2689 Other abnormalities of gait and mobility: Secondary | ICD-10-CM | POA: Diagnosis not present

## 2021-12-23 DIAGNOSIS — R279 Unspecified lack of coordination: Secondary | ICD-10-CM | POA: Diagnosis not present

## 2021-12-23 DIAGNOSIS — R296 Repeated falls: Secondary | ICD-10-CM | POA: Diagnosis not present

## 2021-12-23 DIAGNOSIS — M6281 Muscle weakness (generalized): Secondary | ICD-10-CM | POA: Diagnosis not present

## 2022-01-06 DIAGNOSIS — R41841 Cognitive communication deficit: Secondary | ICD-10-CM | POA: Diagnosis not present

## 2022-01-06 DIAGNOSIS — Z7689 Persons encountering health services in other specified circumstances: Secondary | ICD-10-CM | POA: Diagnosis not present

## 2022-01-08 DIAGNOSIS — E1165 Type 2 diabetes mellitus with hyperglycemia: Secondary | ICD-10-CM | POA: Diagnosis not present

## 2022-01-16 DIAGNOSIS — E1165 Type 2 diabetes mellitus with hyperglycemia: Secondary | ICD-10-CM | POA: Diagnosis not present

## 2022-01-16 DIAGNOSIS — R41841 Cognitive communication deficit: Secondary | ICD-10-CM | POA: Diagnosis not present

## 2022-01-20 DIAGNOSIS — R41841 Cognitive communication deficit: Secondary | ICD-10-CM | POA: Diagnosis not present

## 2022-01-20 DIAGNOSIS — E1165 Type 2 diabetes mellitus with hyperglycemia: Secondary | ICD-10-CM | POA: Diagnosis not present

## 2022-02-17 DIAGNOSIS — B351 Tinea unguium: Secondary | ICD-10-CM | POA: Diagnosis not present

## 2022-02-17 DIAGNOSIS — L603 Nail dystrophy: Secondary | ICD-10-CM | POA: Diagnosis not present

## 2022-02-17 DIAGNOSIS — I739 Peripheral vascular disease, unspecified: Secondary | ICD-10-CM | POA: Diagnosis not present

## 2022-02-17 DIAGNOSIS — E1151 Type 2 diabetes mellitus with diabetic peripheral angiopathy without gangrene: Secondary | ICD-10-CM | POA: Diagnosis not present

## 2022-02-20 DIAGNOSIS — K5900 Constipation, unspecified: Secondary | ICD-10-CM | POA: Diagnosis not present

## 2022-02-20 DIAGNOSIS — E785 Hyperlipidemia, unspecified: Secondary | ICD-10-CM | POA: Diagnosis not present

## 2022-02-20 DIAGNOSIS — E1165 Type 2 diabetes mellitus with hyperglycemia: Secondary | ICD-10-CM | POA: Diagnosis not present

## 2022-02-20 DIAGNOSIS — L853 Xerosis cutis: Secondary | ICD-10-CM | POA: Diagnosis not present

## 2022-05-07 ENCOUNTER — Encounter: Payer: Self-pay | Admitting: Family Medicine

## 2022-05-07 ENCOUNTER — Non-Acute Institutional Stay: Payer: Medicare Other | Admitting: Family Medicine

## 2022-05-07 VITALS — HR 72 | Temp 97.8°F | Resp 16

## 2022-05-07 DIAGNOSIS — Z515 Encounter for palliative care: Secondary | ICD-10-CM

## 2022-05-07 DIAGNOSIS — E118 Type 2 diabetes mellitus with unspecified complications: Secondary | ICD-10-CM

## 2022-05-07 NOTE — Progress Notes (Signed)
[facility] Palliative [facility] Consult Note Telephone: [phone]  Fax: [phone]    Date of encounter: 05/07/22 1:16 PM PATIENT NAME: Natalie Peterson [place] [address]   [phone] (home) [phone] (work) DOB: 06-16-1926 MRN: [id number] PRIMARY CARE PROVIDER:    Lajean Manes, MD,  [place] [employer] [address] [address] [phone]  REFERRING PROVIDER:   Lajean Manes, MD [address] [place] [address] [place],  [place] [address] [phone]  RESPONSIBLE PARTY:    Contact Information     Name Relation Home Work Mobile   MASONIC,HOME  [phone]     Doshier,[name] Daughter [phone]  [phone]   Daejah, Klebba Daughter [phone]          I met face to face with patient in [facility] facility. Palliative Care was asked to follow this patient by consultation request of  Lajean Manes, MD to address advance care planning and complex medical decision making. This is a follow up visit   ASSESSMENT , SYMPTOM MANAGEMENT AND PLAN / RECOMMENDATIONS:  DM type 2 with hyperglycemia on long term insulin and with CKD Recommend HGB A1c goal of 7-7.5% in setting of dementia when pt may stop eating as disease progresses. Recommend increase in Lantus by 2 units q 2 days until fasting blood sugar 120.  If need to exceed 20 units, may benefit from split dosing BID.  2.  Palliative Care Encounter Need to follow up with family for goals of care discussion.   Advance Care Planning/Goals of Care:  Decision not to resuscitate or to de-escalate disease focused treatments due to poor prognosis. CODE STATUS: DNR per facility records     Follow up Palliative Care Visit: Palliative care will continue to follow for complex medical decision making, advance care planning, and clarification of goals. Return 4 weeks or prn.    This visit was coded based on medical decision making  (MDM).  PPS: 50%  HOSPICE ELIGIBILITY/DIAGNOSIS: TBD  Chief Complaint:  Palliative Care is continuing to follow pt with dementia for chronic symptom management and refining goals of care.  HISTORY OF PRESENT ILLNESS:  Natalie Peterson is a 89 y.o. year old female with dementia without behavioral disturbance, hx of CVA, HLD, hx of fall, controlled type 2 DM, and HTN.  Seen sleeping in chair in activity room after lunch.  Did not answer when name called.  When asking questions, response was non-sensical and pt was irritable with attempts to get VS so BP was deferred.  Facility staff indicates her appetite is good, she needs assistance with bathing and dressing, is able to self toilet and has no current issues. Uses rollator for mobility. Per notes in EMR at facility, blood sugars are running typically in the low to mid 300 range fasting with an intermittent blood sugar in 200s and isolated 118. Bps overall stable, well controlled 110-130s/70s-80s.  HGB A1c increased from 6.1% 07/2021 to 9% 0n 01/19/22.    History obtained from review of EMR, discussion with primary team, and interview with family, facility staff/caregiver and/or Natalie Peterson.   11/21/21 complete echo: LV EF 60-65%, no regional wall motion abnormalities, mild LVH, grade I DD, mild mitral and aortic regurg.   I reviewed EMR for available labs, medications, imaging, studies and related documents. No labs or imaging since last visit.  ROS Unable to obtain from pt Information from facility staff  Physical Exam: Current and past weights: 131 lbs as of 11/26/21,  121.2 lbs on 04/30/22 Constitutional: NAD General: frail appearing, overweight ENMT: ? hearing CV: S1S2, RRR with left sternal border murmur, no LE edema, wearing TED hose Pulmonary: CTAB but diminshed, poorly cooperative with exam, no increased work of breathing, no cough, room air Abdomen:  normo-active BS + 4 quadrants, soft and non tender, no ascites GU:  deferred MSK: no sarcopenia, moves all extremities Skin: warm and dry, no rashes or wounds on visible skin Neuro:  no generalized weakness,  Largely non-verbal Psych: non-anxious affect, nonsensical response, sleeping arousable and unable to determine level of orientation due to lack of verbal communication    Thank you for the opportunity to participate in the care of Natalie Peterson.  The palliative care team will continue to follow. Please call our office at [phone] if we can be of additional assistance.   Marijo Conception, FNP -C  COVID-19 PATIENT SCREENING TOOL Asked and negative response unless otherwise noted:   Have you had symptoms of covid, tested positive or been in contact with someone with symptoms/positive test in the past 5-10 days?  unknown

## 2022-05-11 DIAGNOSIS — G250 Essential tremor: Secondary | ICD-10-CM | POA: Diagnosis not present

## 2022-05-11 DIAGNOSIS — I10 Essential (primary) hypertension: Secondary | ICD-10-CM | POA: Diagnosis not present

## 2022-05-11 DIAGNOSIS — E1165 Type 2 diabetes mellitus with hyperglycemia: Secondary | ICD-10-CM | POA: Diagnosis not present

## 2022-05-11 DIAGNOSIS — R41841 Cognitive communication deficit: Secondary | ICD-10-CM

## 2022-05-11 DIAGNOSIS — Z8679 Personal history of other diseases of the circulatory system: Secondary | ICD-10-CM

## 2022-05-11 DIAGNOSIS — E785 Hyperlipidemia, unspecified: Secondary | ICD-10-CM | POA: Diagnosis not present

## 2022-05-11 DIAGNOSIS — K5900 Constipation, unspecified: Secondary | ICD-10-CM

## 2022-05-11 DIAGNOSIS — Z6823 Body mass index (BMI) 23.0-23.9, adult: Secondary | ICD-10-CM

## 2022-05-11 DIAGNOSIS — J302 Other seasonal allergic rhinitis: Secondary | ICD-10-CM

## 2022-05-12 DIAGNOSIS — E119 Type 2 diabetes mellitus without complications: Secondary | ICD-10-CM | POA: Diagnosis not present

## 2022-05-12 DIAGNOSIS — E559 Vitamin D deficiency, unspecified: Secondary | ICD-10-CM | POA: Diagnosis not present

## 2022-05-12 DIAGNOSIS — Z79899 Other long term (current) drug therapy: Secondary | ICD-10-CM | POA: Diagnosis not present

## 2022-05-12 DIAGNOSIS — E08311 Diabetes mellitus due to underlying condition with unspecified diabetic retinopathy with macular edema: Secondary | ICD-10-CM | POA: Diagnosis not present

## 2022-05-13 DIAGNOSIS — R41841 Cognitive communication deficit: Secondary | ICD-10-CM | POA: Diagnosis not present

## 2022-05-13 DIAGNOSIS — E559 Vitamin D deficiency, unspecified: Secondary | ICD-10-CM | POA: Diagnosis not present

## 2022-05-13 DIAGNOSIS — N1831 Chronic kidney disease, stage 3a: Secondary | ICD-10-CM | POA: Diagnosis not present

## 2022-05-13 DIAGNOSIS — E1165 Type 2 diabetes mellitus with hyperglycemia: Secondary | ICD-10-CM | POA: Diagnosis not present

## 2022-05-29 DIAGNOSIS — N390 Urinary tract infection, site not specified: Secondary | ICD-10-CM | POA: Diagnosis not present

## 2022-06-09 DIAGNOSIS — Z7689 Persons encountering health services in other specified circumstances: Secondary | ICD-10-CM | POA: Diagnosis not present

## 2022-06-09 DIAGNOSIS — R41841 Cognitive communication deficit: Secondary | ICD-10-CM | POA: Diagnosis not present

## 2022-06-09 DIAGNOSIS — N1831 Chronic kidney disease, stage 3a: Secondary | ICD-10-CM | POA: Diagnosis not present

## 2022-07-01 DIAGNOSIS — I63532 Cerebral infarction due to unspecified occlusion or stenosis of left posterior cerebral artery: Secondary | ICD-10-CM | POA: Diagnosis not present

## 2022-07-01 DIAGNOSIS — E569 Vitamin deficiency, unspecified: Secondary | ICD-10-CM | POA: Diagnosis not present

## 2022-07-01 DIAGNOSIS — E559 Vitamin D deficiency, unspecified: Secondary | ICD-10-CM | POA: Diagnosis not present

## 2022-07-02 DIAGNOSIS — E559 Vitamin D deficiency, unspecified: Secondary | ICD-10-CM | POA: Diagnosis not present

## 2022-07-02 DIAGNOSIS — E1165 Type 2 diabetes mellitus with hyperglycemia: Secondary | ICD-10-CM | POA: Diagnosis not present

## 2022-07-02 DIAGNOSIS — R41841 Cognitive communication deficit: Secondary | ICD-10-CM | POA: Diagnosis not present

## 2022-08-21 ENCOUNTER — Encounter: Payer: Self-pay | Admitting: Family Medicine

## 2022-08-21 ENCOUNTER — Non-Acute Institutional Stay: Payer: Medicare Other | Admitting: Family Medicine

## 2022-08-21 VITALS — BP 128/80 | HR 79 | Temp 97.7°F | Resp 17

## 2022-08-21 DIAGNOSIS — F0390 Unspecified dementia without behavioral disturbance: Secondary | ICD-10-CM

## 2022-08-21 DIAGNOSIS — E118 Type 2 diabetes mellitus with unspecified complications: Secondary | ICD-10-CM

## 2022-08-21 NOTE — Progress Notes (Unsigned)
[facility]rap[facility], [facility] Palliative Care Consult Note Telephone: [phone]  Fax: [phone]   Date of encounter: 08/21/22 2:30 PM PATIENT NAME: Natalie Peterson [address] [place] [address]   [phone] (home) [phone] (work) DOB: June 30, 1926 MRN: [id number] PRIMARY CARE PROVIDER:    Eloisa Northern, MD,  [address] [place] [address] [phone]  REFERRING PROVIDER:   Eloisa Northern, MD [address] [place],  [place] [address] [phone]  Emergency Contact:    Contact Information     Name Relation Home Work Mobile   MASONIC,HOME  [phone]     Achenbach,[name] Daughter [phone]  [phone]   Reveca, Desmarais Daughter [phone]         Health Care POA/Health Care Agent   I met face to face with patient in *** facility. Palliative Care was asked to follow this patient by consultation request of Eloisa Northern, MD to address advance care planning and complex medical decision making. This is an initial/follow up visit.  ADVANCE CARE PLANNING/GOALS OF CARE: Patient/health care surrogate gave his/her permission to discuss.   PRESENT FOR DISCUSSION: GOALS: BARRIERS:   Our advance care planning conversation included a discussion about:    The value and importance of advance care planning  Experiences with loved ones who have been seriously ill or have died  Exploration of personal, cultural or spiritual beliefs that might influence medical decisions  Review, updating or creation of an advance directive document.  CODE STATUS:   I spent ***   minutes providing this consultation with more than 50% of the time spent on counseling patient and coordinating communication with referring provider, staff/family, chart review and  documentation. --------------------------------------------------------------------------------------------------------------------------------------------------------------------------------------------  ASSESSMENT AND / RECOMMENDATIONS:  PPS:    Follow up Palliative Care Visit:  Palliative Care continuing to follow up by monitoring for changes in appetite, weight, functional and cognitive status for chronic disease progression and management in agreement with patient's stated goals of care. Next visit in *** weeks or prn.  This visit was coded based on medical decision making (MDM).  Chief Complaint *** HISTORY OF PRESENT ILLNESS: NAME@ is a 89 y.o. year old female with *** .    ACTIVITIES OF DAILY LIVING: CONTINENT OF BLADDER? Y/N CONTINENT OF BOWEL? Y/N BATHING/DRESSING/FEEDING:  MOBILITY:   INDEPENDENTLY AMBULATORY/AMBULATORY WITH ASSISTIVE DEVICE: Administrator WHEELCHAIR/POWER CHAIR? BEDBOUND?  APPETITE? WEIGHT:  CURRENT PROBLEM LIST:  Patient Active Problem List   Diagnosis Date Noted   DM (diabetes mellitus), type 2 with complications 05/07/2022   Palliative care encounter    AMS (altered mental status) 11/21/2021   Essential hypertension 11/20/2021   Hyperlipidemia 11/20/2021   Controlled type 2 diabetes mellitus without complication, without long-term current use of insulin 11/20/2021   Dementia without behavioral disturbance 11/20/2021   Fall 11/20/2021   History of CVA (cerebrovascular accident) 04/01/2018   PAST MEDICAL HISTORY:  Active Ambulatory Problems    Diagnosis Date Noted   History of CVA (cerebrovascular accident) 04/01/2018   Essential hypertension 11/20/2021   Hyperlipidemia 11/20/2021   Controlled type 2 diabetes mellitus without complication, without long-term current use of insulin 11/20/2021   Dementia without behavioral disturbance 11/20/2021   Fall 11/20/2021   AMS (altered mental status) 11/21/2021   Palliative care encounter     DM (diabetes mellitus), type 2 with complications 05/07/2022   Resolved Ambulatory Problems    Diagnosis Date Noted   Acute metabolic encephalopathy 11/20/2021   UTI (urinary tract infection) 11/20/2021  Past Medical History:  Diagnosis Date   Dementia (HCC)    Diabetes mellitus without complication (HCC)    Hypertension    SOCIAL HX:  Social History   Tobacco Use   Smoking status: Never   Smokeless tobacco: Never  Substance Use Topics   Alcohol use: No   FAMILY HX:  Family History  Problem Relation Age of Onset   Hypertension Mother    Stroke Mother    Hypertension Father    Stroke Father     ***   Preferred Pharmacy: ALLERGIES: No Known Allergies   PERTINENT MEDICATIONS:  Outpatient Encounter Medications as of 08/21/2022  Medication Sig   acetaminophen (TYLENOL) 500 MG tablet Take 500 mg by mouth every 4 (four) hours as needed for mild pain.   amLODipine (NORVASC) 5 MG tablet Take 5 mg by mouth daily.   aspirin EC 81 MG tablet Take 81 mg by mouth daily. Swallow whole.   cetirizine (ZYRTEC) 5 MG tablet Take 5 mg by mouth daily.   cholecalciferol (VITAMIN D) 25 MCG (1000 UNIT) tablet Take 1,000 Units by mouth daily. (Patient not taking: Reported on 05/07/2022)   Eyelid Cleansers (OCUSOFT LID SCRUB PLUS) PADS Apply 1 each topically daily. Apply to bilateral eyelids daily   fenofibrate (TRICOR) 48 MG tablet Take 48 mg by mouth daily.   fluocinonide (LIDEX) 0.05 % external solution Apply 1 application topically 2 (two) times daily as needed (scalp).   hydrochlorothiazide (MICROZIDE) 12.5 MG capsule Take 12.5 mg by mouth daily.   insulin glargine (LANTUS SOLOSTAR) 100 UNIT/ML Solostar Pen Inject 14 Units into the skin at bedtime.   ketoconazole (NIZORAL) 2 % shampoo Apply 1 Application topically 2 (two) times a week. Tues and Friday   Melatonin 5 MG CAPS Take by mouth daily. (Patient not taking: Reported on 11/20/2021)   metFORMIN (GLUCOPHAGE) 500 MG tablet Take 500 mg by  mouth daily.   Multiple Vitamins-Minerals (CEROVITE PO) Take 1 tablet by mouth daily.   Nutritional Supplements (NUTRITIONAL DRINK PO) Take 120 mLs by mouth in the morning, at noon, and at bedtime. Med Pass 2.0   Polyethyl Glycol-Propyl Glycol (SYSTANE ULTRA) 0.4-0.3 % SOLN Apply 1 drop to eye 4 (four) times daily as needed (dry eyes/eye irritation).   polyethylene glycol (MIRALAX / GLYCOLAX) 17 g packet Take 17 g by mouth daily.   PRIMIDONE PO Take 12.5 mg by mouth at bedtime.   sennosides-docusate sodium (SENOKOT-S) 8.6-50 MG tablet Take 1 tablet by mouth at bedtime.   sitaGLIPtin (JANUVIA) 50 MG tablet Take 50 mg by mouth daily.   No facility-administered encounter medications on file as of 08/21/2022.    History obtained from review of EMR, discussion with primary team, and interview with family, facility staff/caregiver and/or patient.   CBC    Component Value Date/Time   WBC 15.2 (H) 11/22/2021 0454   RBC 4.71 11/22/2021 0454   HGB 14.4 11/22/2021 0454   HCT 40.6 11/22/2021 0454   PLT 288 11/22/2021 0454   MCV 86.2 11/22/2021 0454   MCH 30.6 11/22/2021 0454   MCHC 35.5 11/22/2021 0454   RDW 12.2 11/22/2021 0454   LYMPHSABS 3.5 11/20/2021 1544   MONOABS 1.0 11/20/2021 1544   EOSABS 0.4 11/20/2021 1544   BASOSABS 0.1 11/20/2021 1544    CMP    Latest Ref Rng & Units 11/22/2021    4:54 AM 11/21/2021    5:07 AM 11/20/2021   10:07 PM  CMP  Glucose 70 - 99 mg/dL 119  138    BUN 8 - 23 mg/dL 13  19    Creatinine 9.56 - 1.00 mg/dL 2.13  0.86    Sodium 578 - 145 mmol/L 134  140  138   Potassium 3.5 - 5.1 mmol/L 3.7  3.3  3.3   Chloride 98 - 111 mmol/L 105  107    CO2 22 - 32 mmol/L 22  25    Calcium 8.9 - 10.3 mg/dL 8.7  9.3    Total Protein 6.5 - 8.1 g/dL  5.8    Total Bilirubin 0.3 - 1.2 mg/dL  0.6    Alkaline Phos 38 - 126 U/L  31    AST 15 - 41 U/L  27    ALT 0 - 44 U/L  19      LFTs    Latest Ref Rng & Units 11/21/2021    5:07 AM 11/20/2021    9:51 PM 04/01/2018    12:25 PM  Hepatic Function  Total Protein 6.5 - 8.1 g/dL 5.8  6.6  6.7   Albumin 3.5 - 5.0 g/dL 2.9  3.1  3.4   AST 15 - 41 U/L 27  27  22    ALT 0 - 44 U/L 19  23  18    Alk Phosphatase 38 - 126 U/L 31  34  43   Total Bilirubin 0.3 - 1.2 mg/dL 0.6  0.5  0.8   Bilirubin, Direct 0.0 - 0.2 mg/dL  <4.6      Urinalysis    Component Value Date/Time   COLORURINE YELLOW 11/20/2021 1608   APPEARANCEUR HAZY (A) 11/20/2021 1608   LABSPEC 1.014 11/20/2021 1608   PHURINE 5.0 11/20/2021 1608   GLUCOSEU 50 (A) 11/20/2021 1608   HGBUR SMALL (A) 11/20/2021 1608   BILIRUBINUR NEGATIVE 11/20/2021 1608   KETONESUR NEGATIVE 11/20/2021 1608   PROTEINUR 100 (A) 11/20/2021 1608   NITRITE NEGATIVE 11/20/2021 1608   LEUKOCYTESUR LARGE (A) 11/20/2021 1608    HGB A1c (last 3 results) @LAST3A1c @  BNP (last 3 results) No results for input(s): "BNP" in the last 8760 hours.  ProBNP (last 3 results) No results for input(s): "PROBNP" in the last 8760 hours.   I reviewed available labs, medications, imaging, studies and related documents from the EMR.  There were no new records/imaging since last visit. Records reviewed and summarized above.   Physical Exam: GENERAL: NAD LUNGS: CTAB, no increased work of breathing, room air CARDIAC:  S1S2, RRR with no MRG, Y/N edema, Y/N cyanosis ABD:  Normo-active BS x 4 quads, soft, non-tender EXTREMITIES: Normal ROM, no deformity, strength equal, Y/N muscle atrophy/subcutaneous fat loss NEURO:  No weakness or cognitive impairment, aphasia-expressive/receptive  PSYCH:  non-anxious affect, A & O x 3  Thank you for the opportunity to participate in the care of Natalie Peterson                                                                                                                                          ```````````````.  Please call our main office at [phone] if we can be of additional assistance.    Joycelyn Man FNP-C   [email or web address]  Phone:  [phone]

## 2022-08-26 DIAGNOSIS — N390 Urinary tract infection, site not specified: Secondary | ICD-10-CM | POA: Diagnosis not present

## 2022-10-14 ENCOUNTER — Encounter: Payer: Self-pay | Admitting: Family Medicine

## 2022-10-14 ENCOUNTER — Non-Acute Institutional Stay: Payer: Medicare Other | Admitting: Family Medicine

## 2022-10-14 VITALS — BP 124/60 | HR 75 | Temp 97.2°F | Resp 18

## 2022-10-14 DIAGNOSIS — E118 Type 2 diabetes mellitus with unspecified complications: Secondary | ICD-10-CM

## 2022-10-14 DIAGNOSIS — F0390 Unspecified dementia without behavioral disturbance: Secondary | ICD-10-CM

## 2022-10-14 DIAGNOSIS — Z515 Encounter for palliative care: Secondary | ICD-10-CM

## 2022-10-14 NOTE — Progress Notes (Signed)
Therapist, nutritional Palliative Care Consult Note Telephone: [phone]  Fax: [phone]   Date of encounter: 10/14/22 2:27 PM PATIENT NAME: Natalie Peterson [address] [place] [address]   [phone] (home) [phone] (work) DOB: October 13, 1926 MRN: [id number] PRIMARY CARE PROVIDER:    Eloisa Northern, MD,  [address] [place] [address] [phone]  REFERRING PROVIDER:   Eloisa Northern, MD [address] [place],  [place] [address] [phone]  Emergency Contact:    Contact Information     Name Relation Home Work Mobile   MASONIC,HOME  [phone]     Kurihara,[name] Daughter [phone]  [phone]   Mayukha, Gurule Daughter [phone]          I met face to face with patient in [facility]. Palliative Care was asked to follow this patient by consultation request of Eloisa Northern, MD to address advance care planning and complex medical decision making. This is a follow up visit.  ASSESSMENT AND PLAN / RECOMMENDATIONS:   Health Care POA:  daughter Justis Aten, phone [phone] (cell phone), home number disconnected. Lives in [place].  Advance Care Planning/Goals of Care: Goals include to maximize quality of life and symptom management. Health care surrogate gave his/her permission to discuss.Our advance care planning conversation included a discussion about:    The value and importance of advance care planning-Daughter Bonita Quin is [facility], her sister is unavailable to come and lives in [place].  Exploration of personal, cultural or spiritual beliefs that might influence medical decisions-[name] would like to avoid hospitalization for her mom if at all possible. Exploration of goals of care in the event of a sudden injury or illness-has completed MOST form. Will attempt to locate physical copy and upload to chart Review of an advance directive document-MOST. Decision not to resuscitate or to de-escalate disease  focused treatments due to poor prognosis. CODE STATUS: DNR MOST, expressed verbally by Mar Daring over the phone: DNR/DNI with comfort measures Use of antibiotics and IV fluids on a case by case, time limited basis No feeding tube.  I spent 26 minutes providing this consultation. More than 50% of the time in this consultation was spent in counseling and care coordination.   ASSESSMENT AND / RECOMMENDATIONS:  PPS: 40%  DM type 2 with complications Worsening control.  HGB A1c increased from 9-10.9% from Sept 2023 to January 2024. Some minor improvements in blood sugar over the last month but continues to run 170-250s for almost all, no hypoglycemia. Recommend increasing Lantus from 16 to 20 units QHS and continue CBG BID and increase by 2 units Q 3 days until fasting blood sugar < 150 without hypoglycemia for goal HGB A1c 7.5%.   Dementia Fast 7 score 7c Review goals of care with family On no meds for dementia. Recommend if pt not having issues with allergies to either d/c or make Cetirizine prn.   3.  Palliative Care Encounter Discussed current FAST level and normal aphasia, dysphagia. Reviewed waxing and waning of sx with gradual progression, worse with physical or medical insults. Discussed goals of care and daughter's concerns.    Follow up Palliative Care Visit:  Palliative Care continuing to follow up by monitoring for changes in appetite, weight, functional and cognitive status for chronic disease progression and management in agreement with patient's stated goals of care. Next visit in 4 weeks or prn.  This visit was coded based on medical decision making (MDM).  Chief Complaint  Palliative Care continues to follow up with patient for chronic disease management in setting of dementia.  Palliative Care is also available to assist with advance directive planning and defining/refining goals of care.   HISTORY OF PRESENT ILLNESS: Natalie Peterson is a 89 y.o. year  old female with dementia. She is laying in the bed dozing, arouses easily.  She only responded verbally twice. Once she states that the BP cuff was too tight and she indicated "food".  Nursing staff says she frequently asks for food even after she has eaten.  Staff indicates no falls, problem with coughing or choking when eating or drinking, that appetite and sleep are good. Blood sugars reported at the facility are improving from 180s-390s on prior visit to 170s-250s with an occasional 290s currently.  She remains on Trulicity 0.75 mg weekly on Thursdays and Lantus 16 units daily. She is on bite sized, level 6 dysphagia diet with thin liquids.  Daughter expressed concern that pt no longer needs manicures but needs her fingernails cut and she would like to see her hands washed before pt eats.  We discussed her concern that pt is "messy" using a spoon, spills on herself but has trouble with a tremor and needs foods cut for her or she will eat with her fingers. Discussed possible use of weighted silverware to help pt maintain some independence. She is concerned that pt also needs her toenails cut, wants pt to be out of the room as much as possible for physical stimulation.  She states she will be gone out of the country June 30-July7th for her 30th wedding anniversary but will be available by phone.  States if her mother passes during that time she would like the funeral home to hold her body until she returns home.  She also wanted to reschedule care plan meeting for June 19-21 if possible since the proposed date she cannot make. We discussed changes that I proposed to facility NP for improved glucose control without hypoglycemia.   ACTIVITIES OF DAILY LIVING: CONTINENT OF BLADDER/BOWEL? Intermittently incontinent of both bowel/bladder BATHING/DRESSING/FEEDING: Dependent for bathing/dressing, independent for feeding  MOBILITY:   WHEELCHAIR/BEDBOUND  APPETITE? good WEIGHT: 121.2 lbs as of 10/04/22, 124.4 lbs  on 07/03/22 at facility  CURRENT PROBLEM LIST:  Patient Active Problem List   Diagnosis Date Noted   DM (diabetes mellitus), type 2 with complications (HCC) 05/07/2022   Palliative care encounter    AMS (altered mental status) 11/21/2021   Essential hypertension 11/20/2021   Hyperlipidemia 11/20/2021   Controlled type 2 diabetes mellitus without complication, without long-term current use of insulin (HCC) 11/20/2021   Dementia without behavioral disturbance (HCC) 11/20/2021   Fall 11/20/2021   History of CVA (cerebrovascular accident) 04/01/2018   PAST MEDICAL HISTORY:  Active Ambulatory Problems    Diagnosis Date Noted   History of CVA (cerebrovascular accident) 04/01/2018   Essential hypertension 11/20/2021   Hyperlipidemia 11/20/2021   Controlled type 2 diabetes mellitus without complication, without long-term current use of insulin (HCC) 11/20/2021   Dementia without behavioral disturbance (HCC) 11/20/2021   Fall 11/20/2021   AMS (altered mental status) 11/21/2021   Palliative care encounter    DM (diabetes mellitus), type 2 with complications (HCC) 05/07/2022   Resolved Ambulatory Problems    Diagnosis Date Noted   Acute metabolic encephalopathy 11/20/2021   UTI (urinary tract infection) 11/20/2021   Past Medical History:  Diagnosis Date   Dementia (HCC)    Diabetes mellitus without complication (HCC)  Hypertension    SOCIAL HX:  Social History   Tobacco Use   Smoking status: Never   Smokeless tobacco: Never  Substance Use Topics   Alcohol use: No   FAMILY HX:  Family History  Problem Relation Age of Onset   Hypertension Mother    Stroke Mother    Hypertension Father    Stroke Father      Preferred Pharmacy: ALLERGIES: No Known Allergies   PERTINENT MEDICATIONS:  Outpatient Encounter Medications as of 10/14/2022  Medication Sig   acetaminophen (TYLENOL) 500 MG tablet Take 500 mg by mouth every 4 (four) hours as needed for mild pain.   amLODipine  (NORVASC) 5 MG tablet Take 5 mg by mouth daily.   aspirin EC 81 MG tablet Take 81 mg by mouth daily. Swallow whole.   cetirizine (ZYRTEC) 5 MG tablet Take 5 mg by mouth daily.   cholecalciferol (VITAMIN D) 25 MCG (1000 UNIT) tablet Take 1,000 Units by mouth daily.   Dulaglutide (TRULICITY) 0.75 MG/0.5ML SOPN Inject 0.75 mg into the skin once a week. On Thursdays   Eyelid Cleansers (OCUSOFT LID SCRUB PLUS) PADS Apply 1 each topically daily. Apply to bilateral eyelids daily   fenofibrate (TRICOR) 48 MG tablet Take 48 mg by mouth daily.   fluocinonide (LIDEX) 0.05 % external solution Apply 1 application topically 2 (two) times daily as needed (scalp).   hydrochlorothiazide (MICROZIDE) 12.5 MG capsule Take 12.5 mg by mouth daily.   insulin glargine (LANTUS SOLOSTAR) 100 UNIT/ML Solostar Pen Inject 16 Units into the skin at bedtime.   ketoconazole (NIZORAL) 2 % shampoo Apply 1 Application topically 2 (two) times a week. Tues and Friday   Melatonin 5 MG CAPS Take by mouth daily.   Multiple Vitamins-Minerals (CEROVITE PO) Take 1 tablet by mouth daily.   Nutritional Supplements (NUTRITIONAL DRINK PO) Take 120 mLs by mouth in the morning, at noon, and at bedtime. Med Pass 2.0   Polyethyl Glycol-Propyl Glycol (SYSTANE ULTRA) 0.4-0.3 % SOLN Apply 1 drop to eye 4 (four) times daily as needed (dry eyes/eye irritation).   polyethylene glycol (MIRALAX / GLYCOLAX) 17 g packet Take 17 g by mouth daily.   PRIMIDONE PO Take 12.5 mg by mouth at bedtime.   sennosides-docusate sodium (SENOKOT-S) 8.6-50 MG tablet Take 1 tablet by mouth at bedtime.   No facility-administered encounter medications on file as of 10/14/2022.    History obtained from review of EMR, discussion with facility staff/caregiver and/or patient.       I reviewed available labs, medications, imaging, studies and related documents from the EMR. Records reviewed and summarized above.   Physical Exam: GENERAL: NAD LUNGS: CTAB, no increased  work of breathing, room air CARDIAC:  S1S2, RRR with soft LUSB murmur, No edema/cyanosis ABD:  Normo-active BS x 4 quads, soft, non-tender EXTREMITIES: Moves all extremities equally.   NEURO:  Noted significant cognitive impairment with limited verbalization aphasia-expressive/receptive  PSYCH:  non-anxious affect, Dozing/arousable, oriented to name  Thank you for the opportunity to participate in the care of Oris Drone. Please call our main office at [phone] if we can be of additional assistance.    Joycelyn Man FNP-C  [email or web address]  Phone:  [phone]

## 2022-10-21 DIAGNOSIS — E039 Hypothyroidism, unspecified: Secondary | ICD-10-CM | POA: Diagnosis not present

## 2022-10-29 DIAGNOSIS — M1612 Unilateral primary osteoarthritis, left hip: Secondary | ICD-10-CM | POA: Diagnosis not present

## 2022-10-30 DIAGNOSIS — R0602 Shortness of breath: Secondary | ICD-10-CM | POA: Diagnosis not present

## 2022-11-28 ENCOUNTER — Emergency Department (HOSPITAL_COMMUNITY)
Admission: EM | Admit: 2022-11-28 | Discharge: 2022-11-28 | Disposition: A | Discharge status: Other Acute Inpatient Hospital | Payer: Medicare Other | Attending: Emergency Medicine | Admitting: Emergency Medicine

## 2022-11-28 ENCOUNTER — Encounter (HOSPITAL_COMMUNITY): Payer: Self-pay

## 2022-11-28 ENCOUNTER — Emergency Department (HOSPITAL_COMMUNITY): Payer: Medicare Other

## 2022-11-28 ENCOUNTER — Other Ambulatory Visit: Payer: Self-pay

## 2022-11-28 DIAGNOSIS — Z043 Encounter for examination and observation following other accident: Secondary | ICD-10-CM | POA: Diagnosis not present

## 2022-11-28 DIAGNOSIS — Z79899 Other long term (current) drug therapy: Secondary | ICD-10-CM | POA: Insufficient documentation

## 2022-11-28 DIAGNOSIS — F0390 Unspecified dementia without behavioral disturbance: Secondary | ICD-10-CM | POA: Diagnosis not present

## 2022-11-28 DIAGNOSIS — R6889 Other general symptoms and signs: Secondary | ICD-10-CM | POA: Diagnosis not present

## 2022-11-28 DIAGNOSIS — I10 Essential (primary) hypertension: Secondary | ICD-10-CM | POA: Insufficient documentation

## 2022-11-28 DIAGNOSIS — Y92011 Dining room of single-family (private) house as the place of occurrence of the external cause: Secondary | ICD-10-CM | POA: Insufficient documentation

## 2022-11-28 DIAGNOSIS — S0990XA Unspecified injury of head, initial encounter: Secondary | ICD-10-CM | POA: Diagnosis not present

## 2022-11-28 DIAGNOSIS — E119 Type 2 diabetes mellitus without complications: Secondary | ICD-10-CM | POA: Diagnosis not present

## 2022-11-28 DIAGNOSIS — R4182 Altered mental status, unspecified: Secondary | ICD-10-CM | POA: Diagnosis not present

## 2022-11-28 DIAGNOSIS — G319 Degenerative disease of nervous system, unspecified: Secondary | ICD-10-CM | POA: Diagnosis not present

## 2022-11-28 DIAGNOSIS — Z7982 Long term (current) use of aspirin: Secondary | ICD-10-CM | POA: Diagnosis not present

## 2022-11-28 DIAGNOSIS — S0003XA Contusion of scalp, initial encounter: Secondary | ICD-10-CM

## 2022-11-28 DIAGNOSIS — R9389 Abnormal findings on diagnostic imaging of other specified body structures: Secondary | ICD-10-CM | POA: Diagnosis not present

## 2022-11-28 DIAGNOSIS — W19XXXA Unspecified fall, initial encounter: Secondary | ICD-10-CM | POA: Diagnosis not present

## 2022-11-28 DIAGNOSIS — Z794 Long term (current) use of insulin: Secondary | ICD-10-CM | POA: Insufficient documentation

## 2022-11-28 DIAGNOSIS — I6782 Cerebral ischemia: Secondary | ICD-10-CM | POA: Diagnosis not present

## 2022-11-28 DIAGNOSIS — M549 Dorsalgia, unspecified: Secondary | ICD-10-CM | POA: Diagnosis not present

## 2022-11-28 DIAGNOSIS — Z743 Need for continuous supervision: Secondary | ICD-10-CM | POA: Diagnosis not present

## 2022-11-28 DIAGNOSIS — S199XXA Unspecified injury of neck, initial encounter: Secondary | ICD-10-CM | POA: Diagnosis not present

## 2022-11-28 DIAGNOSIS — W01198A Fall on same level from slipping, tripping and stumbling with subsequent striking against other object, initial encounter: Secondary | ICD-10-CM | POA: Diagnosis not present

## 2022-11-28 NOTE — ED Provider Notes (Signed)
[facility] AT [facility] Provider Note   CSN: [id number] Arrival date & time: 11/28/22  1853     History  Chief Complaint  Patient presents with   Natalie Peterson is a 89 y.o. female history of dementia, hypertension, diabetes here presenting with fall.  Patient had an unwitnessed fall in the dining room and hit her head.  Patient cannot give me any history.  Patient has no complaints.  Patient is not on any blood thinners.  The history is provided by the patient.       Home Medications Prior to Admission medications   Medication Sig Start Date End Date Taking? Authorizing Provider  acetaminophen (TYLENOL) 500 MG tablet Take 500 mg by mouth every 4 (four) hours as needed for mild pain.    [provider]  amLODipine (NORVASC) 5 MG tablet Take 5 mg by mouth daily. 09/24/21   [provider]  aspirin EC 81 MG tablet Take 81 mg by mouth daily. Swallow whole.    [provider]  cetirizine (ZYRTEC) 5 MG tablet Take 5 mg by mouth daily.    [provider]  cholecalciferol (VITAMIN D) 25 MCG (1000 UNIT) tablet Take 1,000 Units by mouth daily.    [provider]  Dulaglutide (TRULICITY) 0.75 MG/0.5ML SOPN Inject 0.75 mg into the skin once a week. On Thursdays    [provider]  Eyelid Cleansers (OCUSOFT LID SCRUB PLUS) PADS Apply 1 each topically daily. Apply to bilateral eyelids daily    [provider]  fenofibrate (TRICOR) 48 MG tablet Take 48 mg by mouth daily. 12/25/18   [provider]  fluocinonide (LIDEX) 0.05 % external solution Apply 1 application topically 2 (two) times daily as needed (scalp).    [provider]  hydrochlorothiazide (MICROZIDE) 12.5 MG capsule Take 12.5 mg by mouth daily.    [provider]  insulin glargine (LANTUS SOLOSTAR) 100 UNIT/ML Solostar Pen Inject 16 Units into the skin at bedtime.    [provider]   ketoconazole (NIZORAL) 2 % shampoo Apply 1 Application topically 2 (two) times a week. Tues and Friday    [provider]  Melatonin 5 MG CAPS Take by mouth daily. Patient not taking: Reported on 10/14/2022    [provider]  Multiple Vitamins-Minerals (CEROVITE PO) Take 1 tablet by mouth daily.    [provider]  Nutritional Supplements (NUTRITIONAL DRINK PO) Take 120 mLs by mouth in the morning, at noon, and at bedtime. Med Pass 2.0    [provider]  Polyethyl Glycol-Propyl Glycol (SYSTANE ULTRA) 0.4-0.3 % SOLN Apply 1 drop to eye 4 (four) times daily as needed (dry eyes/eye irritation).    [provider]  polyethylene glycol (MIRALAX / GLYCOLAX) 17 g packet Take 17 g by mouth daily.    [provider]  PRIMIDONE PO Take 12.5 mg by mouth at bedtime.    [provider]  sennosides-docusate sodium (SENOKOT-S) 8.6-50 MG tablet Take 1 tablet by mouth at bedtime.    [provider]      Allergies    Patient has no known allergies.    Review of Systems   Review of Systems  Skin:  Positive for color change.  All other systems reviewed and are negative.   Physical Exam Updated Vital Signs BP (!) 148/58 (BP Location: Left Arm)   Pulse 84   Temp 97.7 F (36.5 C) (Axillary)   Resp  18   SpO2 98%  Physical Exam Vitals and nursing note reviewed.  Constitutional:      Comments: Chronically ill and demented  HENT:     Head:     Comments: Questionable posterior scalp hematoma    Nose: Nose normal.     Mouth/Throat:     Mouth: Mucous membranes are moist.  Eyes:     Extraocular Movements: Extraocular movements intact.     Pupils: Pupils are equal, round, and reactive to light.  Neck:     Comments: C collar in place  Cardiovascular:     Rate and Rhythm: Normal rate and regular rhythm.     Pulses: Normal pulses.     Heart sounds: Normal heart sounds.  Pulmonary:     Effort: Pulmonary effort is normal.      Breath sounds: Normal breath sounds.  Abdominal:     General: Abdomen is flat.     Palpations: Abdomen is soft.  Musculoskeletal:        General: Normal range of motion.  Skin:    General: Skin is warm.     Capillary Refill: Capillary refill takes less than 2 seconds.  Neurological:     Comments: Demented but moving all extremities     ED Results / Procedures / Treatments   Labs (all labs ordered are listed, but only abnormal results are displayed) Labs Reviewed - No data to display  EKG None  Radiology No results found.  Procedures Procedures    Medications Ordered in ED Medications - No data to display  ED Course/ Medical Decision Making/ A&P                             Medical Decision Making Natalie Peterson is a 89 y.o. female here with fall. Patient had a unwitnessed fall and hit her head.  Patient does have posterior scalp hematoma.  Plan to get CT head and cervical spine.  Will also get chest x-ray and pelvis x-ray.  8:25 PM CT is unremarkable.  X-rays unremarkable.  Patient is stable for discharge back to facility  Problems Addressed: Contusion of scalp, initial encounter: acute illness or injury Fall, initial encounter: acute illness or injury  Amount and/or Complexity of Data Reviewed Radiology: ordered and independent interpretation performed. Decision-making details documented in ED Course.    Final Clinical Impression(s) / ED Diagnoses Final diagnoses:  None    Rx / DC Orders ED Discharge Orders     None         Charlynne Pander, MD 11/28/22 2025

## 2022-11-28 NOTE — ED Triage Notes (Signed)
BIBA from [facility] for a witnessed fall in the dining room, hit head, in c-collar, c/o back pain, no LOC, no thinners. 203 cbg

## 2022-11-28 NOTE — Discharge Instructions (Signed)
Your CT scan today did not show any fractures or bleeding  Fall precautions at the facility  Please continue current meds  Return to ER if you have another fall, headache, vomiting

## 2022-11-28 NOTE — ED Notes (Signed)
[facility] notified for patient transport 

## 2022-12-02 DIAGNOSIS — M47816 Spondylosis without myelopathy or radiculopathy, lumbar region: Secondary | ICD-10-CM | POA: Diagnosis not present

## 2022-12-02 DIAGNOSIS — M47814 Spondylosis without myelopathy or radiculopathy, thoracic region: Secondary | ICD-10-CM | POA: Diagnosis not present

## 2022-12-03 DIAGNOSIS — U071 COVID-19: Secondary | ICD-10-CM | POA: Diagnosis not present

## 2022-12-03 DIAGNOSIS — R2689 Other abnormalities of gait and mobility: Secondary | ICD-10-CM | POA: Diagnosis not present

## 2022-12-03 DIAGNOSIS — M6281 Muscle weakness (generalized): Secondary | ICD-10-CM | POA: Diagnosis not present

## 2022-12-04 ENCOUNTER — Telehealth: Payer: Self-pay

## 2022-12-04 DIAGNOSIS — U071 COVID-19: Secondary | ICD-10-CM | POA: Diagnosis not present

## 2022-12-04 DIAGNOSIS — R2689 Other abnormalities of gait and mobility: Secondary | ICD-10-CM | POA: Diagnosis not present

## 2022-12-04 DIAGNOSIS — M6281 Muscle weakness (generalized): Secondary | ICD-10-CM | POA: Diagnosis not present

## 2022-12-04 NOTE — Telephone Encounter (Signed)
Transition Care Management Unsuccessful Follow-up Telephone Call  Date of discharge and from where:  [facility] 7/27  Attempts:  1st Attempt  Reason for unsuccessful TCM follow-up call:  No answer/busy   Lenard Forth [facility] Guide, [facility] [phone] [address] [place], [place], [place] [address] Phone: [phone] Email: [email or web address]

## 2022-12-04 NOTE — Telephone Encounter (Signed)
Transition Care Management Unsuccessful Follow-up Telephone Call  Date of discharge and from where:  [facility] 7/27  Attempts:  2nd Attempt  Reason for unsuccessful TCM follow-up call:  No answer/busy   Lenard Forth [facility] Guide, [facility] [phone] [address] [place], [place], [place] [address] Phone: [phone] Email: [email or web address]

## 2022-12-08 DIAGNOSIS — R2689 Other abnormalities of gait and mobility: Secondary | ICD-10-CM | POA: Diagnosis not present

## 2022-12-08 DIAGNOSIS — U071 COVID-19: Secondary | ICD-10-CM | POA: Diagnosis not present

## 2022-12-08 DIAGNOSIS — M6281 Muscle weakness (generalized): Secondary | ICD-10-CM | POA: Diagnosis not present

## 2022-12-12 DIAGNOSIS — R2689 Other abnormalities of gait and mobility: Secondary | ICD-10-CM | POA: Diagnosis not present

## 2022-12-12 DIAGNOSIS — U071 COVID-19: Secondary | ICD-10-CM | POA: Diagnosis not present

## 2022-12-12 DIAGNOSIS — M6281 Muscle weakness (generalized): Secondary | ICD-10-CM | POA: Diagnosis not present

## 2022-12-15 DIAGNOSIS — U071 COVID-19: Secondary | ICD-10-CM | POA: Diagnosis not present

## 2022-12-15 DIAGNOSIS — M6281 Muscle weakness (generalized): Secondary | ICD-10-CM | POA: Diagnosis not present

## 2022-12-15 DIAGNOSIS — R2689 Other abnormalities of gait and mobility: Secondary | ICD-10-CM | POA: Diagnosis not present

## 2022-12-16 DIAGNOSIS — M6281 Muscle weakness (generalized): Secondary | ICD-10-CM | POA: Diagnosis not present

## 2022-12-16 DIAGNOSIS — U071 COVID-19: Secondary | ICD-10-CM | POA: Diagnosis not present

## 2022-12-16 DIAGNOSIS — R2689 Other abnormalities of gait and mobility: Secondary | ICD-10-CM | POA: Diagnosis not present

## 2022-12-17 DIAGNOSIS — Z79899 Other long term (current) drug therapy: Secondary | ICD-10-CM | POA: Diagnosis not present

## 2022-12-17 DIAGNOSIS — I10 Essential (primary) hypertension: Secondary | ICD-10-CM | POA: Diagnosis not present

## 2022-12-18 DIAGNOSIS — U071 COVID-19: Secondary | ICD-10-CM | POA: Diagnosis not present

## 2022-12-18 DIAGNOSIS — Z79899 Other long term (current) drug therapy: Secondary | ICD-10-CM | POA: Diagnosis not present

## 2022-12-18 DIAGNOSIS — R2689 Other abnormalities of gait and mobility: Secondary | ICD-10-CM | POA: Diagnosis not present

## 2022-12-18 DIAGNOSIS — M6281 Muscle weakness (generalized): Secondary | ICD-10-CM | POA: Diagnosis not present

## 2022-12-22 DIAGNOSIS — M6281 Muscle weakness (generalized): Secondary | ICD-10-CM | POA: Diagnosis not present

## 2022-12-22 DIAGNOSIS — R2689 Other abnormalities of gait and mobility: Secondary | ICD-10-CM | POA: Diagnosis not present

## 2022-12-22 DIAGNOSIS — U071 COVID-19: Secondary | ICD-10-CM | POA: Diagnosis not present

## 2022-12-23 DIAGNOSIS — U071 COVID-19: Secondary | ICD-10-CM | POA: Diagnosis not present

## 2022-12-23 DIAGNOSIS — M6281 Muscle weakness (generalized): Secondary | ICD-10-CM | POA: Diagnosis not present

## 2022-12-23 DIAGNOSIS — R2689 Other abnormalities of gait and mobility: Secondary | ICD-10-CM | POA: Diagnosis not present

## 2022-12-24 DIAGNOSIS — E559 Vitamin D deficiency, unspecified: Secondary | ICD-10-CM | POA: Diagnosis not present

## 2022-12-24 DIAGNOSIS — N1831 Chronic kidney disease, stage 3a: Secondary | ICD-10-CM | POA: Diagnosis not present

## 2022-12-24 DIAGNOSIS — R41841 Cognitive communication deficit: Secondary | ICD-10-CM | POA: Diagnosis not present

## 2022-12-24 DIAGNOSIS — Z7689 Persons encountering health services in other specified circumstances: Secondary | ICD-10-CM

## 2022-12-25 DIAGNOSIS — M6281 Muscle weakness (generalized): Secondary | ICD-10-CM | POA: Diagnosis not present

## 2022-12-25 DIAGNOSIS — R2689 Other abnormalities of gait and mobility: Secondary | ICD-10-CM | POA: Diagnosis not present

## 2022-12-25 DIAGNOSIS — U071 COVID-19: Secondary | ICD-10-CM | POA: Diagnosis not present

## 2022-12-30 DIAGNOSIS — E119 Type 2 diabetes mellitus without complications: Secondary | ICD-10-CM | POA: Diagnosis not present

## 2022-12-30 DIAGNOSIS — I10 Essential (primary) hypertension: Secondary | ICD-10-CM | POA: Diagnosis not present

## 2023-01-20 DIAGNOSIS — Z515 Encounter for palliative care: Secondary | ICD-10-CM | POA: Diagnosis not present

## 2023-02-23 DIAGNOSIS — E1165 Type 2 diabetes mellitus with hyperglycemia: Secondary | ICD-10-CM | POA: Diagnosis not present

## 2023-02-23 DIAGNOSIS — Z515 Encounter for palliative care: Secondary | ICD-10-CM | POA: Diagnosis not present

## 2023-02-23 DIAGNOSIS — R634 Abnormal weight loss: Secondary | ICD-10-CM | POA: Diagnosis not present

## 2023-05-18 DIAGNOSIS — R634 Abnormal weight loss: Secondary | ICD-10-CM | POA: Diagnosis not present

## 2023-05-18 DIAGNOSIS — E1165 Type 2 diabetes mellitus with hyperglycemia: Secondary | ICD-10-CM | POA: Diagnosis not present

## 2023-05-18 DIAGNOSIS — Z515 Encounter for palliative care: Secondary | ICD-10-CM | POA: Diagnosis not present

## 2023-05-18 DIAGNOSIS — F03C0 Unspecified dementia, severe, without behavioral disturbance, psychotic disturbance, mood disturbance, and anxiety: Secondary | ICD-10-CM | POA: Diagnosis not present

## 2023-06-22 DIAGNOSIS — R278 Other lack of coordination: Secondary | ICD-10-CM | POA: Diagnosis not present

## 2023-06-22 DIAGNOSIS — M6281 Muscle weakness (generalized): Secondary | ICD-10-CM | POA: Diagnosis not present

## 2023-06-22 DIAGNOSIS — I63532 Cerebral infarction due to unspecified occlusion or stenosis of left posterior cerebral artery: Secondary | ICD-10-CM | POA: Diagnosis not present

## 2023-06-26 DIAGNOSIS — R278 Other lack of coordination: Secondary | ICD-10-CM | POA: Diagnosis not present

## 2023-06-26 DIAGNOSIS — M6281 Muscle weakness (generalized): Secondary | ICD-10-CM | POA: Diagnosis not present

## 2023-06-26 DIAGNOSIS — I63532 Cerebral infarction due to unspecified occlusion or stenosis of left posterior cerebral artery: Secondary | ICD-10-CM | POA: Diagnosis not present

## 2023-06-28 DIAGNOSIS — M6281 Muscle weakness (generalized): Secondary | ICD-10-CM | POA: Diagnosis not present

## 2023-06-28 DIAGNOSIS — R278 Other lack of coordination: Secondary | ICD-10-CM | POA: Diagnosis not present

## 2023-06-28 DIAGNOSIS — I63532 Cerebral infarction due to unspecified occlusion or stenosis of left posterior cerebral artery: Secondary | ICD-10-CM | POA: Diagnosis not present

## 2023-06-30 DIAGNOSIS — M6281 Muscle weakness (generalized): Secondary | ICD-10-CM | POA: Diagnosis not present

## 2023-06-30 DIAGNOSIS — I63532 Cerebral infarction due to unspecified occlusion or stenosis of left posterior cerebral artery: Secondary | ICD-10-CM | POA: Diagnosis not present

## 2023-06-30 DIAGNOSIS — R278 Other lack of coordination: Secondary | ICD-10-CM | POA: Diagnosis not present

## 2023-07-02 DIAGNOSIS — M6281 Muscle weakness (generalized): Secondary | ICD-10-CM | POA: Diagnosis not present

## 2023-07-02 DIAGNOSIS — I63532 Cerebral infarction due to unspecified occlusion or stenosis of left posterior cerebral artery: Secondary | ICD-10-CM | POA: Diagnosis not present

## 2023-07-02 DIAGNOSIS — R278 Other lack of coordination: Secondary | ICD-10-CM | POA: Diagnosis not present

## 2023-07-05 DIAGNOSIS — R278 Other lack of coordination: Secondary | ICD-10-CM | POA: Diagnosis not present

## 2023-07-05 DIAGNOSIS — M6281 Muscle weakness (generalized): Secondary | ICD-10-CM | POA: Diagnosis not present

## 2023-07-05 DIAGNOSIS — I63532 Cerebral infarction due to unspecified occlusion or stenosis of left posterior cerebral artery: Secondary | ICD-10-CM | POA: Diagnosis not present

## 2023-07-07 DIAGNOSIS — I63532 Cerebral infarction due to unspecified occlusion or stenosis of left posterior cerebral artery: Secondary | ICD-10-CM | POA: Diagnosis not present

## 2023-07-07 DIAGNOSIS — M6281 Muscle weakness (generalized): Secondary | ICD-10-CM | POA: Diagnosis not present

## 2023-07-07 DIAGNOSIS — R278 Other lack of coordination: Secondary | ICD-10-CM | POA: Diagnosis not present

## 2023-07-12 DIAGNOSIS — I63532 Cerebral infarction due to unspecified occlusion or stenosis of left posterior cerebral artery: Secondary | ICD-10-CM | POA: Diagnosis not present

## 2023-07-12 DIAGNOSIS — R278 Other lack of coordination: Secondary | ICD-10-CM | POA: Diagnosis not present

## 2023-07-12 DIAGNOSIS — M6281 Muscle weakness (generalized): Secondary | ICD-10-CM | POA: Diagnosis not present

## 2023-07-16 DIAGNOSIS — I63532 Cerebral infarction due to unspecified occlusion or stenosis of left posterior cerebral artery: Secondary | ICD-10-CM | POA: Diagnosis not present

## 2023-07-16 DIAGNOSIS — R278 Other lack of coordination: Secondary | ICD-10-CM | POA: Diagnosis not present

## 2023-07-16 DIAGNOSIS — M6281 Muscle weakness (generalized): Secondary | ICD-10-CM | POA: Diagnosis not present

## 2023-07-17 DIAGNOSIS — M6281 Muscle weakness (generalized): Secondary | ICD-10-CM | POA: Diagnosis not present

## 2023-07-17 DIAGNOSIS — I63532 Cerebral infarction due to unspecified occlusion or stenosis of left posterior cerebral artery: Secondary | ICD-10-CM | POA: Diagnosis not present

## 2023-07-17 DIAGNOSIS — R278 Other lack of coordination: Secondary | ICD-10-CM | POA: Diagnosis not present

## 2023-07-19 DIAGNOSIS — I63532 Cerebral infarction due to unspecified occlusion or stenosis of left posterior cerebral artery: Secondary | ICD-10-CM | POA: Diagnosis not present

## 2023-07-19 DIAGNOSIS — R278 Other lack of coordination: Secondary | ICD-10-CM | POA: Diagnosis not present

## 2023-07-19 DIAGNOSIS — M6281 Muscle weakness (generalized): Secondary | ICD-10-CM | POA: Diagnosis not present

## 2023-07-21 DIAGNOSIS — R278 Other lack of coordination: Secondary | ICD-10-CM | POA: Diagnosis not present

## 2023-07-21 DIAGNOSIS — I63532 Cerebral infarction due to unspecified occlusion or stenosis of left posterior cerebral artery: Secondary | ICD-10-CM | POA: Diagnosis not present

## 2023-07-21 DIAGNOSIS — M6281 Muscle weakness (generalized): Secondary | ICD-10-CM | POA: Diagnosis not present

## 2023-07-22 DIAGNOSIS — M6281 Muscle weakness (generalized): Secondary | ICD-10-CM | POA: Diagnosis not present

## 2023-07-22 DIAGNOSIS — R278 Other lack of coordination: Secondary | ICD-10-CM | POA: Diagnosis not present

## 2023-07-22 DIAGNOSIS — I63532 Cerebral infarction due to unspecified occlusion or stenosis of left posterior cerebral artery: Secondary | ICD-10-CM | POA: Diagnosis not present

## 2023-09-28 DIAGNOSIS — I63532 Cerebral infarction due to unspecified occlusion or stenosis of left posterior cerebral artery: Secondary | ICD-10-CM | POA: Diagnosis not present

## 2023-09-28 DIAGNOSIS — I69320 Aphasia following cerebral infarction: Secondary | ICD-10-CM

## 2023-09-28 DIAGNOSIS — I739 Peripheral vascular disease, unspecified: Secondary | ICD-10-CM | POA: Diagnosis not present

## 2023-09-28 DIAGNOSIS — E785 Hyperlipidemia, unspecified: Secondary | ICD-10-CM | POA: Diagnosis not present

## 2023-09-28 DIAGNOSIS — E1151 Type 2 diabetes mellitus with diabetic peripheral angiopathy without gangrene: Secondary | ICD-10-CM | POA: Diagnosis not present

## 2023-11-29 DIAGNOSIS — J309 Allergic rhinitis, unspecified: Secondary | ICD-10-CM | POA: Diagnosis not present

## 2023-11-29 DIAGNOSIS — E119 Type 2 diabetes mellitus without complications: Secondary | ICD-10-CM | POA: Diagnosis not present

## 2023-11-29 DIAGNOSIS — E785 Hyperlipidemia, unspecified: Secondary | ICD-10-CM | POA: Diagnosis not present

## 2023-11-29 DIAGNOSIS — I10 Essential (primary) hypertension: Secondary | ICD-10-CM | POA: Diagnosis not present

## 2023-12-19 ENCOUNTER — Emergency Department (HOSPITAL_COMMUNITY)
Admission: EM | Admit: 2023-12-19 | Discharge: 2023-12-19 | Disposition: A | Discharge status: Home / Self Care | Source: Skilled Nursing Facility | Attending: Emergency Medicine | Admitting: Emergency Medicine

## 2023-12-19 ENCOUNTER — Emergency Department (HOSPITAL_COMMUNITY)

## 2023-12-19 ENCOUNTER — Other Ambulatory Visit: Payer: Self-pay

## 2023-12-19 DIAGNOSIS — Z79899 Other long term (current) drug therapy: Secondary | ICD-10-CM | POA: Insufficient documentation

## 2023-12-19 DIAGNOSIS — I6523 Occlusion and stenosis of bilateral carotid arteries: Secondary | ICD-10-CM | POA: Diagnosis not present

## 2023-12-19 DIAGNOSIS — R531 Weakness: Secondary | ICD-10-CM | POA: Diagnosis not present

## 2023-12-19 DIAGNOSIS — M47812 Spondylosis without myelopathy or radiculopathy, cervical region: Secondary | ICD-10-CM | POA: Diagnosis not present

## 2023-12-19 DIAGNOSIS — Y92002 Bathroom of unspecified non-institutional (private) residence single-family (private) house as the place of occurrence of the external cause: Secondary | ICD-10-CM | POA: Insufficient documentation

## 2023-12-19 DIAGNOSIS — S0083XA Contusion of other part of head, initial encounter: Secondary | ICD-10-CM | POA: Insufficient documentation

## 2023-12-19 DIAGNOSIS — S0081XA Abrasion of other part of head, initial encounter: Secondary | ICD-10-CM | POA: Insufficient documentation

## 2023-12-19 DIAGNOSIS — I10 Essential (primary) hypertension: Secondary | ICD-10-CM | POA: Diagnosis not present

## 2023-12-19 DIAGNOSIS — E119 Type 2 diabetes mellitus without complications: Secondary | ICD-10-CM | POA: Diagnosis not present

## 2023-12-19 DIAGNOSIS — Z7982 Long term (current) use of aspirin: Secondary | ICD-10-CM | POA: Diagnosis not present

## 2023-12-19 DIAGNOSIS — R011 Cardiac murmur, unspecified: Secondary | ICD-10-CM | POA: Insufficient documentation

## 2023-12-19 DIAGNOSIS — M47816 Spondylosis without myelopathy or radiculopathy, lumbar region: Secondary | ICD-10-CM | POA: Diagnosis not present

## 2023-12-19 DIAGNOSIS — M4312 Spondylolisthesis, cervical region: Secondary | ICD-10-CM | POA: Diagnosis not present

## 2023-12-19 DIAGNOSIS — W19XXXA Unspecified fall, initial encounter: Secondary | ICD-10-CM

## 2023-12-19 DIAGNOSIS — Z7401 Bed confinement status: Secondary | ICD-10-CM | POA: Diagnosis not present

## 2023-12-19 DIAGNOSIS — S0993XA Unspecified injury of face, initial encounter: Secondary | ICD-10-CM | POA: Diagnosis not present

## 2023-12-19 DIAGNOSIS — W182XXA Fall in (into) shower or empty bathtub, initial encounter: Secondary | ICD-10-CM | POA: Diagnosis not present

## 2023-12-19 DIAGNOSIS — M160 Bilateral primary osteoarthritis of hip: Secondary | ICD-10-CM | POA: Diagnosis not present

## 2023-12-19 DIAGNOSIS — F0390 Unspecified dementia without behavioral disturbance: Secondary | ICD-10-CM | POA: Diagnosis not present

## 2023-12-19 DIAGNOSIS — Z043 Encounter for examination and observation following other accident: Secondary | ICD-10-CM | POA: Diagnosis not present

## 2023-12-19 LAB — CBC
HCT: 41.1 % (ref 36.0–46.0)
Hemoglobin: 13.7 g/dL (ref 12.0–15.0)
MCH: 30 pg (ref 26.0–34.0)
MCHC: 33.3 g/dL (ref 30.0–36.0)
MCV: 89.9 fL (ref 80.0–100.0)
Platelets: 337 K/uL (ref 150–400)
RBC: 4.57 MIL/uL (ref 3.87–5.11)
RDW: 13 % (ref 11.5–15.5)
WBC: 11.2 K/uL — ABNORMAL HIGH (ref 4.0–10.5)
nRBC: 0 % (ref 0.0–0.2)

## 2023-12-19 LAB — BASIC METABOLIC PANEL WITH GFR
Anion gap: 10 (ref 5–15)
BUN: 30 mg/dL — ABNORMAL HIGH (ref 8–23)
CO2: 27 mmol/L (ref 22–32)
Calcium: 10.1 mg/dL (ref 8.9–10.3)
Chloride: 101 mmol/L (ref 98–111)
Creatinine, Ser: 1.04 mg/dL — ABNORMAL HIGH (ref 0.44–1.00)
GFR, Estimated: 49 mL/min — ABNORMAL LOW (ref >60)
Glucose, Bld: 144 mg/dL — ABNORMAL HIGH (ref 70–99)
Potassium: 4.5 mmol/L (ref 3.5–5.1)
Sodium: 138 mmol/L (ref 135–145)

## 2023-12-19 NOTE — Discharge Instructions (Signed)
Follow-up with your primary care providerReturn to the emergency department if your symptoms worsen

## 2023-12-19 NOTE — ED Triage Notes (Signed)
 Pt arrived via [facility] from [facility] for unwitnessed fall, pt arrived with c-collar. Normally aaox2. Per facility stated seems more confused than normal.  130/40 map 70 72 18 97 ra 171 bg

## 2023-12-19 NOTE — ED Provider Notes (Signed)
 [place] EMERGENCY DEPARTMENT AT [facility] Provider Note   CSN: [id number] Arrival date & time: 12/19/23  1059     Patient presents with: Natalie Peterson is a 89 y.o. female.   89 year old female presenting after a fall.  Patient is brought in by EMS from her skilled nursing facility, they tell me that this was an unwitnessed fall and patient was believed to have fallen when getting off of the toilet this morning, she subsequently cried out for help and staff was able to assist her and help her up, staff tells EMS that they do not feel that she lost consciousness, she is believed to have struck her head on the floor when falling forward as there is a contusion/abrasion to her left forehead.  Patient has dementia and is A&O x 2 at baseline.  She is on aspirin , no other blood thinners.  She tells me that her head hurts, denies any other complaints.  She does ambulate using a rollator walker.   Fall       Prior to Admission medications   Medication Sig Start Date End Date Taking? Authorizing Provider  acetaminophen  (TYLENOL ) 500 MG tablet Take 500 mg by mouth every 4 (four) hours as needed for mild pain.    [provider]  amLODipine  (NORVASC ) 5 MG tablet Take 5 mg by mouth daily. 09/24/21   [provider]  aspirin  EC 81 MG tablet Take 81 mg by mouth daily. Swallow whole.    [provider]  cetirizine (ZYRTEC) 5 MG tablet Take 5 mg by mouth daily.    [provider]  cholecalciferol (VITAMIN D) 25 MCG (1000 UNIT) tablet Take 1,000 Units by mouth daily.    [provider]  Dulaglutide (TRULICITY) 0.75 MG/0.5ML SOPN Inject 0.75 mg into the skin once a week. On Thursdays    [provider]  Eyelid Cleansers (OCUSOFT LID SCRUB PLUS) PADS Apply 1 each topically daily. Apply to bilateral eyelids daily    [provider]  fenofibrate  (TRICOR ) 48 MG tablet Take 48 mg by mouth daily. 12/25/18   [provider]  fluocinonide (LIDEX) 0.05 % external solution Apply 1 application topically 2 (two) times daily as needed (scalp).    [provider]  hydrochlorothiazide (MICROZIDE) 12.5 MG capsule Take 12.5 mg by mouth daily.    [provider]  insulin  glargine (LANTUS  SOLOSTAR) 100 UNIT/ML Solostar Pen Inject 16 Units into the skin at bedtime.    [provider]  ketoconazole (NIZORAL) 2 % shampoo Apply 1 Application topically 2 (two) times a week. Tues and Friday    [provider]  Melatonin 5 MG CAPS Take by mouth daily. Patient not taking: Reported on 10/14/2022    [provider]  Multiple Vitamins-Minerals (CEROVITE PO) Take 1 tablet by mouth daily.    [provider]  Nutritional Supplements (NUTRITIONAL DRINK PO) Take 120 mLs by mouth in the morning, at noon, and at bedtime. Med Pass 2.0    [provider]  Polyethyl Glycol-Propyl Glycol (SYSTANE ULTRA) 0.4-0.3 % SOLN Apply 1 drop to eye 4 (four) times daily as needed (dry eyes/eye irritation).    [provider]  polyethylene glycol (MIRALAX / GLYCOLAX) 17 g packet Take 17 g by mouth daily.    [provider]  PRIMIDONE  PO Take 12.5 mg by mouth at bedtime.    [provider]  sennosides-docusate sodium  (SENOKOT-S) 8.6-50 MG tablet Take 1 tablet by  mouth at bedtime.    [provider]    Allergies: Patient has no known allergies.    Review of Systems  Updated Vital Signs  Vitals:   12/19/23 1111 12/19/23 1117  BP: (!) 159/62   Pulse: 63   Resp: 18   Temp: 98.5 F (36.9 C)   TempSrc: Oral   SpO2: 99% 97%     Physical Exam Vitals and nursing note reviewed.  HENT:     Head:      Comments: Contusion to left forehead with abrasion    Mouth/Throat:     Mouth: Mucous membranes are moist.  Eyes:     Extraocular Movements: Extraocular movements intact.     Pupils: Pupils are equal, round, and reactive to light.  Neck:      Comments: C-collar in place Cardiovascular:     Rate and Rhythm: Normal rate and regular rhythm.     Heart sounds: Murmur heard.  Pulmonary:     Effort: Pulmonary effort is normal.     Breath sounds: Normal breath sounds.  Abdominal:     Palpations: Abdomen is soft.     Tenderness: There is no abdominal tenderness. There is no guarding.  Musculoskeletal:     Comments: Upper extremities: Full passive range of motion at the shoulder/elbow/wrist, no obvious bony deformity or tenderness to palpation. Lower extremities: Full passive range of motion at the knees/ankles, no obvious bony deformity or tenderness to palpation.   No tenderness to palpation of the bony pelvis.  Skin:    General: Skin is warm and dry.  Neurological:     Mental Status: She is alert.     Comments: She is unable to answer her name/where she lives/where she is currently.     (all labs ordered are listed, but only abnormal results are displayed) Labs Reviewed  CBC - Abnormal; Notable for the following components:      Result Value   WBC 11.2 (*)    All other components within normal limits  BASIC METABOLIC PANEL WITH GFR - Abnormal; Notable for the following components:   Glucose, Bld 144 (*)    BUN 30 (*)    Creatinine, Ser 1.04 (*)    GFR, Estimated 49 (*)    All other components within normal limits    EKG: EKG Interpretation Date/Time:  Sunday December 19 2023 11:13:06 EDT Ventricular Rate:  66 PR Interval:  244 QRS Duration:  129 QT Interval:  454 QTC Calculation: 476 R Axis:   66  Text Interpretation: Sinus rhythm Prolonged PR interval Left bundle branch block No significant change since last tracing Confirmed by Randol Simmonds [phone]) on 12/19/2023 12:05:34 PM  Radiology: CT Cervical Spine Wo Contrast Result Date: 12/19/2023 EXAM: CT CERVICAL SPINE WITHOUT CONTRAST 12/19/2023 11:42:15 AM TECHNIQUE: CT of the cervical spine was performed without the administration of intravenous contrast. Multiplanar  reformatted images are provided for review. Automated exposure control, iterative reconstruction, and/or weight based adjustment of the mA/kV was utilized to reduce the radiation dose to as low as reasonably achievable. COMPARISON: CT of the cervical spine dated 11/28/2022. CLINICAL HISTORY: Fall. Per triage notes: Pt arrived via [facility] from [facility] for unwitnessed fall, pt arrived with c-collar. Normally aaox2. Per facility stated seems more confused than normal. FINDINGS: CERVICAL SPINE: BONES AND ALIGNMENT: No acute fracture or traumatic malalignment. DEGENERATIVE CHANGES: Multilevel degenerative changes are again noted within the cervical spine. Slight degenerative anterolisthesis at C6-7 and C7-T1 are again noted. SOFT TISSUES: No prevertebral  soft tissue swelling. VASCULATURE: Atherosclerotic changes are present. IMPRESSION: 1. No acute abnormality of the cervical spine related to the fall. 2. Multilevel degenerative changes in the cervical spine, including slight degenerative anterolisthesis at C6-7 and C7-T1. Electronically signed by: Lonni Necessary MD 12/19/2023 12:03 PM EDT RP Workstation: [id number]   CT Head Wo Contrast Result Date: 12/19/2023 EXAM: CT HEAD WITHOUT CONTRAST 12/19/2023 11:42:15 AM TECHNIQUE: CT of the head was performed without the administration of intravenous contrast. Automated exposure control, iterative reconstruction, and/or weight based adjustment of the mA/kV was utilized to reduce the radiation dose to as low as reasonably achievable. COMPARISON: None available. CLINICAL HISTORY: Fall with left forehead contusion. Patient arrived via EMS with a cervical collar. Normally alert and oriented x2, but seems more confused than normal. FINDINGS: BRAIN AND VENTRICLES: No acute hemorrhage. Gray-white differentiation is preserved. No hydrocephalus. No extra-axial collection. No mass effect or midline shift. Remote encephalomalacia involving the posterior left temporal,  parietal, and occipital lobe is stable. Moderate atrophy and white matter changes are stable. ORBITS: Bilateral lens replacements are noted. The globes and orbits are otherwise within normal limits. SINUSES: No acute abnormality. SOFT TISSUES AND SKULL: Left supraorbital scalp hematoma is present without underlying fracture or foreign body. No skull fracture. VASCULATURE: Atherosclerotic calcifications are present in the cavernous carotid arteries bilaterally and at the dural margin of both vertebral arteries. No hyperdense vessel is present. IMPRESSION: 1. No acute intracranial abnormality. 2. Left supraorbital scalp hematoma without underlying fracture or foreign body. Electronically signed by: Lonni Necessary MD 12/19/2023 11:50 AM EDT RP Workstation: [id number]   DG Pelvis Portable Result Date: 12/19/2023 EXAM: 1 or 2 VIEW(S) XRAY OF THE PELVIS 12/19/2023 11:27:00 AM COMPARISON: 11/28/2022 CLINICAL HISTORY: Fall. FINDINGS: BONES AND JOINTS: No acute fracture. No focal osseous lesion. No joint dislocation. Mild bilateral hip osteoarthritis. Lumbar degenerative disc disease. SOFT TISSUES: The soft tissues are unremarkable. IMPRESSION: 1. No acute fracture or dislocation. 2. Mild bilateral hip osteoarthritis. Electronically signed by: Waddell Calk MD 12/19/2023 11:34 AM EDT RP Workstation: [id number]   DG Chest Portable 1 View Result Date: 12/19/2023 CLINICAL DATA:  Fall EXAM: PORTABLE CHEST 1 VIEW COMPARISON:  Fall FINDINGS: Normal mediastinum and cardiac silhouette. Normal pulmonary vasculature. No evidence of effusion, infiltrate, or pneumothorax. No acute bony abnormality. IMPRESSION: No acute cardiopulmonary process. Electronically Signed   By: Jackquline Boxer M.D.   On: 12/19/2023 11:33     Procedures   Medications Ordered in the ED - No data to display                                  Medical Decision Making This patient presents to the ED for concern of fall, this involves an  extensive number of treatment options, and is a complaint that carries with it a high risk of complications and morbidity.  The differential diagnosis includes contusion, concussion, intracranial hemorrhage, skull fracture, extremity fracture   Co morbidities that complicate the patient evaluation  Dementia, type 2 diabetes, history of CVA   Additional history obtained:  Additional history obtained from record review External records from outside source obtained and reviewed including previous ED notes, I spoke with EMS who provides collateral information from the patient's facility   Lab Tests:  I Ordered, and personally interpreted labs.  The pertinent results include: CBC notable for mild leukocytosis of 11.2, otherwise unremarkable.  BMP largely stable as compared to previous.   Imaging  Studies ordered:  I ordered imaging studies including CT head/C-spine, XR pelvis, XR chest  I independently visualized and interpreted imaging which showed  - CT head: 1. No acute intracranial abnormality. 2. Left supraorbital scalp hematoma without underlying fracture or foreign body. - CT C-spine: 1. No acute abnormality of the cervical spine related to the fall. 2. Multilevel degenerative changes in the cervical spine, including slight degenerative anterolisthesis at C6-7 and C7-T1. - XR chest: No acute cardiopulmonary process. - XR pelvis: 1. No acute fracture or dislocation. 2. Mild bilateral hip osteoarthritis.  I agree with the radiologist interpretation   Cardiac Monitoring: / EKG:  The patient was maintained on a cardiac monitor.  I personally viewed and interpreted the cardiac monitored which showed an underlying rhythm of: NSR   Problem List / ED Course / Critical interventions / Medication management I have reviewed the patients home medicines and have made adjustments as needed   Test / Admission - Considered:  Physical exam is largely unremarkable as above, patient does have  a contusion forming to her left forehead, she has dementia at baseline so I have some difficulty assessing her neurologic status but she is able to tell me that she is cold and that her head hurts, she is alert and well-appearing.  Vitals are reassuring.  Labs/imaging are reassuring as above.  At this time I suspect that this was a mechanical/ground-level fall while patient was attempting to get off of the toilet on her own rather than a syncopal event, as she has history of similar falls in the past. I do feel that she is safe for discharge back to her skilled nursing facility at this time.  Return precautions discussed.  Staffed with Dr. Randol.    Amount and/or Complexity of Data Reviewed Labs: ordered. Radiology: ordered.        Final diagnoses:  Fall, initial encounter    ED Discharge Orders     None          Natalie Peterson 12/19/23 1331    Randol Simmonds, MD 12/20/23 1500

## 2023-12-19 NOTE — ED Notes (Signed)
 Patients nurse at [facility] said patient fell off toilet and hit her head very hard.

## 2024-02-21 DIAGNOSIS — J309 Allergic rhinitis, unspecified: Secondary | ICD-10-CM | POA: Diagnosis not present

## 2024-02-21 DIAGNOSIS — E119 Type 2 diabetes mellitus without complications: Secondary | ICD-10-CM | POA: Diagnosis not present

## 2024-02-21 DIAGNOSIS — I10 Essential (primary) hypertension: Secondary | ICD-10-CM | POA: Diagnosis not present

## 2024-03-27 DIAGNOSIS — I69320 Aphasia following cerebral infarction: Secondary | ICD-10-CM | POA: Diagnosis not present

## 2024-03-27 DIAGNOSIS — I739 Peripheral vascular disease, unspecified: Secondary | ICD-10-CM | POA: Diagnosis not present

## 2024-03-27 DIAGNOSIS — E1151 Type 2 diabetes mellitus with diabetic peripheral angiopathy without gangrene: Secondary | ICD-10-CM | POA: Diagnosis not present

## 2024-03-27 DIAGNOSIS — R2681 Unsteadiness on feet: Secondary | ICD-10-CM | POA: Diagnosis not present

## 2024-03-27 DIAGNOSIS — E785 Hyperlipidemia, unspecified: Secondary | ICD-10-CM | POA: Diagnosis not present

## 2024-03-30 DIAGNOSIS — N390 Urinary tract infection, site not specified: Secondary | ICD-10-CM | POA: Diagnosis not present
